# Patient Record
Sex: Female | Born: 1945
Health system: Southern US, Community
[De-identification: ages and names within clinical notes are randomized; demographics above are authoritative.]

## PROBLEM LIST (undated history)

## (undated) DIAGNOSIS — M5431 Sciatica, right side: Secondary | ICD-10-CM

## (undated) DIAGNOSIS — E785 Hyperlipidemia, unspecified: Secondary | ICD-10-CM

## (undated) DIAGNOSIS — I639 Cerebral infarction, unspecified: Secondary | ICD-10-CM

## (undated) DIAGNOSIS — IMO0002 Reserved for concepts with insufficient information to code with codable children: Secondary | ICD-10-CM

## (undated) DIAGNOSIS — I10 Essential (primary) hypertension: Secondary | ICD-10-CM

## (undated) DIAGNOSIS — Z720 Tobacco use: Secondary | ICD-10-CM

## (undated) DIAGNOSIS — M329 Systemic lupus erythematosus, unspecified: Secondary | ICD-10-CM

## (undated) HISTORY — PX: REVISION TOTAL HIP ARTHROPLASTY: SHX766

## (undated) HISTORY — PX: TUBAL LIGATION: SHX77

## (undated) HISTORY — DX: Cerebral infarction, unspecified: I63.9

## (undated) HISTORY — DX: Hyperlipidemia, unspecified: E78.5

---

## 2007-10-30 ENCOUNTER — Emergency Department (HOSPITAL_COMMUNITY): Admission: EM | Admit: 2007-10-30 | Discharge: 2007-10-30 | Payer: Self-pay | Admitting: Family Medicine

## 2012-10-17 ENCOUNTER — Emergency Department (INDEPENDENT_AMBULATORY_CARE_PROVIDER_SITE_OTHER)
Admission: EM | Admit: 2012-10-17 | Discharge: 2012-10-17 | Disposition: A | Discharge status: Home / Self Care | Payer: Medicare Other | Source: Home / Self Care

## 2012-10-17 ENCOUNTER — Encounter (HOSPITAL_COMMUNITY): Payer: Self-pay | Admitting: Emergency Medicine

## 2012-10-17 DIAGNOSIS — J019 Acute sinusitis, unspecified: Secondary | ICD-10-CM

## 2012-10-17 HISTORY — DX: Reserved for concepts with insufficient information to code with codable children: IMO0002

## 2012-10-17 HISTORY — DX: Systemic lupus erythematosus, unspecified: M32.9

## 2012-10-17 MED ORDER — PREDNISONE 20 MG PO TABS: 20.0000 mg | ORAL_TABLET | Freq: Every day | ORAL | Status: DC

## 2012-10-17 NOTE — ED Provider Notes (Signed)
History     CSN: [id number]  Arrival date & time 10/17/12  1204   First MD Initiated Contact with Patient 10/17/12 1232      Chief Complaint  Patient presents with  . Sinusitis    (Consider location/radiation/quality/duration/timing/severity/associated sxs/prior treatment) Patient is a 67 y.o. female presenting with sinusitis.  Sinusitis  Associated symptoms include congestion and sinus pressure.   67 year-old female presenting with pain in her forehead and "jaw joint" along with postnasal drip and some popping of her ears. She's not had any fevers or cough. She has some clear nasal discharge. She tried Afrin however it caused severe pain and does not want to try again. She did try some over-the-counter nasal decongestant pill which was helpful. Past Medical History  Diagnosis Date  . Lupus     History reviewed. No pertinent past surgical history.  No family history on file.  History  Substance Use Topics  . Smoking status: Current Every Day Smoker  . Smokeless tobacco: Not on file  . Alcohol Use: No    OB History   Grav Para Term Preterm Abortions TAB SAB Ect Mult Living                  Review of Systems  Constitutional: Negative.   HENT: Positive for congestion, rhinorrhea, sneezing, postnasal drip and sinus pressure. Negative for drooling and trouble swallowing.   Eyes: Negative.   Respiratory: Negative.   Cardiovascular: Negative.   Gastrointestinal: Negative.   Endocrine: Negative.   Genitourinary: Negative.   Musculoskeletal: Negative.   Skin: Negative.   Neurological: Negative.   Psychiatric/Behavioral: Negative.     Allergies  Review of patient's allergies indicates no known allergies.  Home Medications   Current Outpatient Rx  Name  Route  Sig  Dispense  Refill  . predniSONE (DELTASONE) 20 MG tablet   Oral   Take 1 tablet (20 mg total) by mouth daily.   7 tablet   0     Take 40 mg today and then 20 mg daily for 5 days     BP 148/79   Pulse 60  Temp(Src) 98.3 F (36.8 C) (Oral)  SpO2 100%  Physical Exam  Constitutional: She appears well-developed and well-nourished.  HENT:  Head: Normocephalic and atraumatic.  Tenderness of forehead, nasal congestion with oral pharyngeal exudate  Eyes: Pupils are equal, round, and reactive to light.  Neck: Normal range of motion.  Cardiovascular: Normal rate and regular rhythm.   Pulmonary/Chest: Effort normal and breath sounds normal.    ED Course  Procedures (including critical care time)  Labs Reviewed - No data to display No results found.   1. Acute sinusitis       MDM  Continue nasal decongestant, ocean nasal spray and afirn as tolerated. Add Neil-Med sinus rinse, tylenol or Ibuprofena nd Zyrtec.         Calvert Cantor, MD 10/17/12 1413

## 2012-10-17 NOTE — ED Notes (Signed)
Pt is here for poss sinus infection onset yest am Sx include: sneezing, nasal congestion w/clear mucous, facial pressure, headache,  Denies: f/v/n/d Taking OTC decongestants w/little relief  She is alert w/no signs of acute distress.

## 2015-06-03 ENCOUNTER — Inpatient Hospital Stay (HOSPITAL_COMMUNITY): Payer: Medicare HMO

## 2015-06-03 ENCOUNTER — Inpatient Hospital Stay (HOSPITAL_COMMUNITY)
Admission: EM | Admit: 2015-06-03 | Discharge: 2015-06-05 | DRG: 470 | Disposition: A | Discharge status: Skilled Nursing Facility | Payer: Medicare HMO | Attending: Internal Medicine | Admitting: Internal Medicine

## 2015-06-03 ENCOUNTER — Encounter (HOSPITAL_COMMUNITY)
Admission: EM | Disposition: A | Discharge status: Skilled Nursing Facility | Payer: Self-pay | Source: Home / Self Care | Attending: Internal Medicine

## 2015-06-03 ENCOUNTER — Encounter (HOSPITAL_COMMUNITY): Payer: Self-pay | Admitting: *Deleted

## 2015-06-03 ENCOUNTER — Inpatient Hospital Stay (HOSPITAL_COMMUNITY): Payer: Medicare HMO | Admitting: Anesthesiology

## 2015-06-03 ENCOUNTER — Emergency Department (HOSPITAL_COMMUNITY): Payer: Medicare HMO

## 2015-06-03 DIAGNOSIS — R200 Anesthesia of skin: Secondary | ICD-10-CM | POA: Diagnosis present

## 2015-06-03 DIAGNOSIS — F17210 Nicotine dependence, cigarettes, uncomplicated: Secondary | ICD-10-CM | POA: Diagnosis present

## 2015-06-03 DIAGNOSIS — W109XXA Fall (on) (from) unspecified stairs and steps, initial encounter: Secondary | ICD-10-CM | POA: Diagnosis present

## 2015-06-03 DIAGNOSIS — Z720 Tobacco use: Secondary | ICD-10-CM | POA: Diagnosis not present

## 2015-06-03 DIAGNOSIS — M5431 Sciatica, right side: Secondary | ICD-10-CM | POA: Diagnosis present

## 2015-06-03 DIAGNOSIS — D72828 Other elevated white blood cell count: Secondary | ICD-10-CM | POA: Diagnosis present

## 2015-06-03 DIAGNOSIS — M25551 Pain in right hip: Secondary | ICD-10-CM | POA: Diagnosis present

## 2015-06-03 DIAGNOSIS — M329 Systemic lupus erythematosus, unspecified: Secondary | ICD-10-CM

## 2015-06-03 DIAGNOSIS — D72829 Elevated white blood cell count, unspecified: Secondary | ICD-10-CM | POA: Diagnosis present

## 2015-06-03 DIAGNOSIS — Y92019 Unspecified place in single-family (private) house as the place of occurrence of the external cause: Secondary | ICD-10-CM

## 2015-06-03 DIAGNOSIS — F17200 Nicotine dependence, unspecified, uncomplicated: Secondary | ICD-10-CM | POA: Diagnosis present

## 2015-06-03 DIAGNOSIS — S72001D Fracture of unspecified part of neck of right femur, subsequent encounter for closed fracture with routine healing: Secondary | ICD-10-CM | POA: Diagnosis not present

## 2015-06-03 DIAGNOSIS — IMO0002 Reserved for concepts with insufficient information to code with codable children: Secondary | ICD-10-CM | POA: Diagnosis present

## 2015-06-03 DIAGNOSIS — S72001A Fracture of unspecified part of neck of right femur, initial encounter for closed fracture: Principal | ICD-10-CM | POA: Diagnosis present

## 2015-06-03 DIAGNOSIS — Z96649 Presence of unspecified artificial hip joint: Secondary | ICD-10-CM

## 2015-06-03 DIAGNOSIS — M1611 Unilateral primary osteoarthritis, right hip: Secondary | ICD-10-CM | POA: Diagnosis present

## 2015-06-03 DIAGNOSIS — W19XXXA Unspecified fall, initial encounter: Secondary | ICD-10-CM | POA: Diagnosis present

## 2015-06-03 DIAGNOSIS — D62 Acute posthemorrhagic anemia: Secondary | ICD-10-CM | POA: Diagnosis not present

## 2015-06-03 HISTORY — DX: Tobacco use: Z72.0

## 2015-06-03 HISTORY — PX: TOTAL HIP ARTHROPLASTY: SHX124

## 2015-06-03 HISTORY — DX: Sciatica, right side: M54.31

## 2015-06-03 LAB — COMPREHENSIVE METABOLIC PANEL
ALT: 11 U/L — ABNORMAL LOW (ref 14–54)
AST: 16 U/L (ref 15–41)
Albumin: 3.5 g/dL (ref 3.5–5.0)
Alkaline Phosphatase: 43 U/L (ref 38–126)
Anion gap: 7 (ref 5–15)
BUN: 20 mg/dL (ref 6–20)
CO2: 25 mmol/L (ref 22–32)
Calcium: 8.3 mg/dL — ABNORMAL LOW (ref 8.9–10.3)
Chloride: 110 mmol/L (ref 101–111)
Creatinine, Ser: 0.91 mg/dL (ref 0.44–1.00)
GFR calc Af Amer: >60 mL/min (ref >60)
GFR calc non Af Amer: >60 mL/min (ref >60)
Glucose, Bld: 110 mg/dL — ABNORMAL HIGH (ref 65–99)
Potassium: 3.6 mmol/L (ref 3.5–5.1)
Sodium: 142 mmol/L (ref 135–145)
Total Bilirubin: 0.6 mg/dL (ref 0.3–1.2)
Total Protein: 6 g/dL — ABNORMAL LOW (ref 6.5–8.1)

## 2015-06-03 LAB — CBC WITH DIFFERENTIAL/PLATELET
Basophils Absolute: 0 10*3/uL (ref 0.0–0.1)
Basophils Relative: 0 %
Eosinophils Absolute: 0.1 10*3/uL (ref 0.0–0.7)
Eosinophils Relative: 1 %
HCT: 36.8 % (ref 36.0–46.0)
HEMOGLOBIN: 12.1 g/dL (ref 12.0–15.0)
LYMPHS ABS: 0.9 10*3/uL (ref 0.7–4.0)
LYMPHS PCT: 7 %
MCH: 32.3 pg (ref 26.0–34.0)
MCHC: 32.9 g/dL (ref 30.0–36.0)
MCV: 98.1 fL (ref 78.0–100.0)
MONO ABS: 0.7 10*3/uL (ref 0.1–1.0)
Monocytes Relative: 5 %
NEUTROS ABS: 10.8 10*3/uL — AB (ref 1.7–7.7)
Neutrophils Relative %: 87 %
PLATELETS: 282 10*3/uL (ref 150–400)
RBC: 3.75 MIL/uL — ABNORMAL LOW (ref 3.87–5.11)
RDW: 15.3 % (ref 11.5–15.5)
WBC: 12.5 10*3/uL — ABNORMAL HIGH (ref 4.0–10.5)

## 2015-06-03 LAB — CBC
HCT: 31.9 % — ABNORMAL LOW (ref 36.0–46.0)
Hemoglobin: 10.4 g/dL — ABNORMAL LOW (ref 12.0–15.0)
MCH: 32.4 pg (ref 26.0–34.0)
MCHC: 32.6 g/dL (ref 30.0–36.0)
MCV: 99.4 fL (ref 78.0–100.0)
PLATELETS: 261 10*3/uL (ref 150–400)
RBC: 3.21 MIL/uL — ABNORMAL LOW (ref 3.87–5.11)
RDW: 15.5 % (ref 11.5–15.5)
WBC: 8.9 10*3/uL (ref 4.0–10.5)

## 2015-06-03 LAB — PROTIME-INR
INR: 1.06 (ref 0.00–1.49)
PROTHROMBIN TIME: 14 s (ref 11.6–15.2)

## 2015-06-03 LAB — BASIC METABOLIC PANEL
ANION GAP: 9 (ref 5–15)
BUN: 25 mg/dL — ABNORMAL HIGH (ref 6–20)
CHLORIDE: 107 mmol/L (ref 101–111)
CO2: 23 mmol/L (ref 22–32)
Calcium: 9.4 mg/dL (ref 8.9–10.3)
Creatinine, Ser: 1.1 mg/dL — ABNORMAL HIGH (ref 0.44–1.00)
GFR calc Af Amer: 59 mL/min — ABNORMAL LOW (ref >60)
GFR, EST NON AFRICAN AMERICAN: 51 mL/min — AB (ref >60)
GLUCOSE: 127 mg/dL — AB (ref 65–99)
POTASSIUM: 4.3 mmol/L (ref 3.5–5.1)
Sodium: 139 mmol/L (ref 135–145)

## 2015-06-03 LAB — SURGICAL PCR SCREEN
MRSA, PCR: NEGATIVE
Staphylococcus aureus: NEGATIVE

## 2015-06-03 LAB — ABO/RH: ABO/RH(D): A NEG

## 2015-06-03 LAB — TYPE AND SCREEN
ABO/RH(D): A NEG
Antibody Screen: NEGATIVE

## 2015-06-03 LAB — GLUCOSE, CAPILLARY: GLUCOSE-CAPILLARY: 86 mg/dL (ref 65–99)

## 2015-06-03 SURGERY — ARTHROPLASTY, HIP, TOTAL, ANTERIOR APPROACH
Anesthesia: Monitor Anesthesia Care | Site: Hip | Laterality: Right

## 2015-06-03 MED ORDER — SODIUM CHLORIDE 0.9 % IV SOLN
INTRAVENOUS | Status: DC
  Administered 2015-06-03: 23:00:00 via INTRAVENOUS

## 2015-06-03 MED ORDER — PROPOFOL 10 MG/ML IV BOLUS
INTRAVENOUS | Status: AC
  Filled 2015-06-03: qty 20

## 2015-06-03 MED ORDER — PROPOFOL 500 MG/50ML IV EMUL
INTRAVENOUS | Status: DC | PRN
  Administered 2015-06-03: 50 ug/kg/min via INTRAVENOUS

## 2015-06-03 MED ORDER — MIDAZOLAM HCL 5 MG/5ML IJ SOLN
INTRAMUSCULAR | Status: DC | PRN
  Administered 2015-06-03: 2 mg via INTRAVENOUS

## 2015-06-03 MED ORDER — PROMETHAZINE HCL 25 MG/ML IJ SOLN: 6.2500 mg | INTRAMUSCULAR | Status: DC | PRN

## 2015-06-03 MED ORDER — ONDANSETRON HCL 4 MG/2ML IJ SOLN
INTRAMUSCULAR | Status: DC | PRN
Start: 2015-06-03 — End: 2015-06-03
  Administered 2015-06-03: 4 mg via INTRAVENOUS

## 2015-06-03 MED ORDER — ACETAMINOPHEN 10 MG/ML IV SOLN
1000.0000 mg | Freq: Once | INTRAVENOUS | Status: DC
  Filled 2015-06-03: qty 100

## 2015-06-03 MED ORDER — FLEET ENEMA 7-19 GM/118ML RE ENEM: 1.0000 | ENEMA | Freq: Once | RECTAL | Status: DC | PRN

## 2015-06-03 MED ORDER — ONDANSETRON HCL 4 MG/2ML IJ SOLN
INTRAMUSCULAR | Status: AC
  Filled 2015-06-03: qty 2

## 2015-06-03 MED ORDER — ONDANSETRON HCL 4 MG PO TABS: 4.0000 mg | ORAL_TABLET | Freq: Four times a day (QID) | ORAL | Status: DC | PRN

## 2015-06-03 MED ORDER — FENTANYL CITRATE (PF) 100 MCG/2ML IJ SOLN
INTRAMUSCULAR | Status: DC | PRN
  Administered 2015-06-03: 50 ug via INTRAVENOUS

## 2015-06-03 MED ORDER — OXYCODONE-ACETAMINOPHEN 5-325 MG PO TABS: 1.0000 | ORAL_TABLET | ORAL | Status: DC | PRN

## 2015-06-03 MED ORDER — CEFAZOLIN SODIUM-DEXTROSE 2-3 GM-% IV SOLR
INTRAVENOUS | Status: AC
  Filled 2015-06-03: qty 50

## 2015-06-03 MED ORDER — BUPIVACAINE HCL (PF) 0.25 % IJ SOLN
INTRAMUSCULAR | Status: AC
  Filled 2015-06-03: qty 30

## 2015-06-03 MED ORDER — ONDANSETRON HCL 4 MG PO TABS
4.0000 mg | ORAL_TABLET | Freq: Four times a day (QID) | ORAL | Status: DC | PRN
Start: 2015-06-03 — End: 2015-06-05

## 2015-06-03 MED ORDER — CEFAZOLIN SODIUM-DEXTROSE 2-3 GM-% IV SOLR
INTRAVENOUS | Status: DC | PRN
  Administered 2015-06-03: 2 g via INTRAVENOUS

## 2015-06-03 MED ORDER — ALUM & MAG HYDROXIDE-SIMETH 200-200-20 MG/5ML PO SUSP: 30.0000 mL | Freq: Four times a day (QID) | ORAL | Status: DC | PRN

## 2015-06-03 MED ORDER — NICOTINE 21 MG/24HR TD PT24
21.0000 mg | MEDICATED_PATCH | Freq: Every day | TRANSDERMAL | Status: DC
  Administered 2015-06-03 – 2015-06-05 (×3): 21 mg via TRANSDERMAL
  Filled 2015-06-03 (×3): qty 1

## 2015-06-03 MED ORDER — HYDROMORPHONE HCL 1 MG/ML IJ SOLN
INTRAMUSCULAR | Status: AC
  Administered 2015-06-03: 0.5 mg via INTRAVENOUS
  Filled 2015-06-03: qty 1

## 2015-06-03 MED ORDER — DOCUSATE SODIUM 100 MG PO CAPS
100.0000 mg | ORAL_CAPSULE | Freq: Two times a day (BID) | ORAL | Status: DC
  Administered 2015-06-04 – 2015-06-05 (×3): 100 mg via ORAL

## 2015-06-03 MED ORDER — POLYETHYLENE GLYCOL 3350 17 G PO PACK: 17.0000 g | PACK | Freq: Every day | ORAL | Status: DC | PRN

## 2015-06-03 MED ORDER — LIDOCAINE HCL (CARDIAC) 20 MG/ML IV SOLN
INTRAVENOUS | Status: AC
  Filled 2015-06-03: qty 5

## 2015-06-03 MED ORDER — ADULT MULTIVITAMIN W/MINERALS CH
1.0000 | ORAL_TABLET | Freq: Every day | ORAL | Status: DC
  Administered 2015-06-03 – 2015-06-05 (×3): 1 via ORAL
  Filled 2015-06-03 (×3): qty 1

## 2015-06-03 MED ORDER — DEXAMETHASONE SODIUM PHOSPHATE 10 MG/ML IJ SOLN
INTRAMUSCULAR | Status: AC
  Filled 2015-06-03: qty 1

## 2015-06-03 MED ORDER — ENOXAPARIN SODIUM 40 MG/0.4ML ~~LOC~~ SOLN
40.0000 mg | SUBCUTANEOUS | Status: DC
  Administered 2015-06-04 – 2015-06-05 (×2): 40 mg via SUBCUTANEOUS
  Filled 2015-06-03 (×2): qty 0.4

## 2015-06-03 MED ORDER — ONDANSETRON HCL 4 MG/2ML IJ SOLN: 4.0000 mg | Freq: Four times a day (QID) | INTRAMUSCULAR | Status: DC | PRN

## 2015-06-03 MED ORDER — 0.9 % SODIUM CHLORIDE (POUR BTL) OPTIME
TOPICAL | Status: DC | PRN
  Administered 2015-06-03: 1000 mL

## 2015-06-03 MED ORDER — MIDAZOLAM HCL 2 MG/2ML IJ SOLN
INTRAMUSCULAR | Status: AC
  Filled 2015-06-03: qty 4

## 2015-06-03 MED ORDER — FENTANYL CITRATE (PF) 100 MCG/2ML IJ SOLN: 25.0000 ug | INTRAMUSCULAR | Status: DC | PRN

## 2015-06-03 MED ORDER — MENTHOL 3 MG MT LOZG: 1.0000 | LOZENGE | OROMUCOSAL | Status: DC | PRN

## 2015-06-03 MED ORDER — MORPHINE SULFATE (PF) 2 MG/ML IV SOLN
1.0000 mg | INTRAVENOUS | Status: DC | PRN
  Administered 2015-06-03: 1 mg via INTRAVENOUS
  Filled 2015-06-03: qty 1

## 2015-06-03 MED ORDER — LACTATED RINGERS IV SOLN
INTRAVENOUS | Status: DC
  Administered 2015-06-03: 20:00:00 via INTRAVENOUS
  Administered 2015-06-03: 1000 mL via INTRAVENOUS

## 2015-06-03 MED ORDER — PHENOL 1.4 % MT LIQD
1.0000 | OROMUCOSAL | Status: DC | PRN
  Filled 2015-06-03: qty 177

## 2015-06-03 MED ORDER — ACETAMINOPHEN 10 MG/ML IV SOLN
INTRAVENOUS | Status: AC
  Filled 2015-06-03: qty 100

## 2015-06-03 MED ORDER — MORPHINE SULFATE (PF) 2 MG/ML IV SOLN
2.0000 mg | INTRAVENOUS | Status: DC | PRN
  Administered 2015-06-03 (×3): 2 mg via INTRAVENOUS
  Filled 2015-06-03 (×3): qty 1

## 2015-06-03 MED ORDER — HYDROMORPHONE HCL 1 MG/ML IJ SOLN
0.5000 mg | INTRAMUSCULAR | Status: DC | PRN
  Administered 2015-06-03: 0.5 mg via INTRAVENOUS
  Filled 2015-06-03: qty 1

## 2015-06-03 MED ORDER — METOCLOPRAMIDE HCL 10 MG PO TABS: 5.0000 mg | ORAL_TABLET | Freq: Three times a day (TID) | ORAL | Status: DC | PRN

## 2015-06-03 MED ORDER — FENTANYL CITRATE (PF) 100 MCG/2ML IJ SOLN
INTRAMUSCULAR | Status: AC
  Filled 2015-06-03: qty 4

## 2015-06-03 MED ORDER — ACETAMINOPHEN 325 MG PO TABS: 650.0000 mg | ORAL_TABLET | Freq: Four times a day (QID) | ORAL | Status: DC | PRN

## 2015-06-03 MED ORDER — BISACODYL 10 MG RE SUPP
10.0000 mg | Freq: Every day | RECTAL | Status: DC | PRN
Start: 2015-06-03 — End: 2015-06-05

## 2015-06-03 MED ORDER — BUPIVACAINE HCL (PF) 0.5 % IJ SOLN
INTRAMUSCULAR | Status: DC | PRN
  Administered 2015-06-03: 3 mL

## 2015-06-03 MED ORDER — SODIUM CHLORIDE 0.9 % IV SOLN
INTRAVENOUS | Status: DC
  Administered 2015-06-03 (×2): via INTRAVENOUS

## 2015-06-03 MED ORDER — DEXAMETHASONE SODIUM PHOSPHATE 10 MG/ML IJ SOLN
10.0000 mg | Freq: Once | INTRAMUSCULAR | Status: AC
Start: 2015-06-03 — End: 2015-06-03
  Administered 2015-06-03: 10 mg via INTRAVENOUS

## 2015-06-03 MED ORDER — PROPOFOL 10 MG/ML IV BOLUS
INTRAVENOUS | Status: DC | PRN
  Administered 2015-06-03: 10 mg via INTRAVENOUS
  Administered 2015-06-03: 20 mg via INTRAVENOUS
  Administered 2015-06-03: 10 mg via INTRAVENOUS
  Administered 2015-06-03: 20 mg via INTRAVENOUS

## 2015-06-03 MED ORDER — TRAMADOL HCL 50 MG PO TABS
50.0000 mg | ORAL_TABLET | Freq: Four times a day (QID) | ORAL | Status: DC | PRN
Start: 2015-06-03 — End: 2015-06-05

## 2015-06-03 MED ORDER — CYCLOBENZAPRINE HCL 5 MG PO TABS
5.0000 mg | ORAL_TABLET | Freq: Three times a day (TID) | ORAL | Status: DC | PRN
  Administered 2015-06-04 – 2015-06-05 (×2): 5 mg via ORAL
  Filled 2015-06-03 (×2): qty 1

## 2015-06-03 MED ORDER — METOCLOPRAMIDE HCL 5 MG/ML IJ SOLN: 5.0000 mg | Freq: Three times a day (TID) | INTRAMUSCULAR | Status: DC | PRN

## 2015-06-03 MED ORDER — PHENYLEPHRINE HCL 10 MG/ML IJ SOLN
INTRAMUSCULAR | Status: DC | PRN
  Administered 2015-06-03 (×5): 80 ug via INTRAVENOUS

## 2015-06-03 MED ORDER — OXYCODONE HCL 5 MG PO TABS
5.0000 mg | ORAL_TABLET | ORAL | Status: DC | PRN
  Administered 2015-06-04 – 2015-06-05 (×5): 10 mg via ORAL
  Filled 2015-06-03 (×5): qty 2

## 2015-06-03 MED ORDER — CEFAZOLIN SODIUM-DEXTROSE 2-3 GM-% IV SOLR
2.0000 g | Freq: Four times a day (QID) | INTRAVENOUS | Status: AC
  Administered 2015-06-04 (×2): 2 g via INTRAVENOUS
  Filled 2015-06-03 (×2): qty 50

## 2015-06-03 MED ORDER — TRANEXAMIC ACID 1000 MG/10ML IV SOLN
1000.0000 mg | INTRAVENOUS | Status: AC
  Administered 2015-06-03: 1000 mg via INTRAVENOUS
  Filled 2015-06-03: qty 10

## 2015-06-03 MED ORDER — BUPIVACAINE HCL (PF) 0.25 % IJ SOLN
INTRAMUSCULAR | Status: DC | PRN
  Administered 2015-06-03: 30 mL

## 2015-06-03 SURGICAL SUPPLY — 31 items
BAG DECANTER FOR FLEXI CONT (MISCELLANEOUS) ×3 IMPLANT
BAG ZIPLOCK 12X15 (MISCELLANEOUS) ×3 IMPLANT
BLADE SAG 18X100X1.27 (BLADE) ×3 IMPLANT
CAPT HIP TOTAL 2 ×3 IMPLANT
CLOSURE WOUND 1/2 X4 (GAUZE/BANDAGES/DRESSINGS) ×1
COVER PERINEAL POST (MISCELLANEOUS) ×3 IMPLANT
DECANTER SPIKE VIAL GLASS SM (MISCELLANEOUS) ×3 IMPLANT
DRAPE STERI IOBAN 125X83 (DRAPES) ×3 IMPLANT
DRAPE U-SHAPE 47X51 STRL (DRAPES) ×6 IMPLANT
DRSG ADAPTIC 3X8 NADH LF (GAUZE/BANDAGES/DRESSINGS) ×3 IMPLANT
DRSG MEPILEX BORDER 4X4 (GAUZE/BANDAGES/DRESSINGS) ×3 IMPLANT
DRSG MEPILEX BORDER 4X8 (GAUZE/BANDAGES/DRESSINGS) ×3 IMPLANT
DURAPREP 26ML APPLICATOR (WOUND CARE) ×3 IMPLANT
ELECT REM PT RETURN 9FT ADLT (ELECTROSURGICAL) ×3
ELECTRODE REM PT RTRN 9FT ADLT (ELECTROSURGICAL) ×1 IMPLANT
EVACUATOR 1/8 PVC DRAIN (DRAIN) ×3 IMPLANT
GLOVE BIO SURGEON STRL SZ7.5 (GLOVE) ×3 IMPLANT
GLOVE BIO SURGEON STRL SZ8 (GLOVE) ×6 IMPLANT
GLOVE BIOGEL PI IND STRL 8 (GLOVE) ×2 IMPLANT
GLOVE BIOGEL PI INDICATOR 8 (GLOVE) ×4
GOWN STRL REUS W/TWL LRG LVL3 (GOWN DISPOSABLE) ×3 IMPLANT
GOWN STRL REUS W/TWL XL LVL3 (GOWN DISPOSABLE) ×3 IMPLANT
PACK ANTERIOR HIP CUSTOM (KITS) ×3 IMPLANT
STRIP CLOSURE SKIN 1/2X4 (GAUZE/BANDAGES/DRESSINGS) ×2 IMPLANT
SUT ETHIBOND NAB CT1 #1 30IN (SUTURE) ×3 IMPLANT
SUT MNCRL AB 4-0 PS2 18 (SUTURE) ×3 IMPLANT
SUT VIC AB 2-0 CT1 27 (SUTURE) ×4
SUT VIC AB 2-0 CT1 TAPERPNT 27 (SUTURE) ×2 IMPLANT
SUT VLOC 180 0 24IN GS25 (SUTURE) ×3 IMPLANT
TRAY FOLEY W/METER SILVER 14FR (SET/KITS/TRAYS/PACK) ×3 IMPLANT
TRAY FOLEY W/METER SILVER 16FR (SET/KITS/TRAYS/PACK) IMPLANT

## 2015-06-03 NOTE — Progress Notes (Signed)
PT Cancellation Note  Patient Details Name: Minnah Llamas MRN: [id number] DOB: 1945/12/05   Cancelled Treatment:     PT order received but eval deferred.  Pt NPO after midnight for surgery.  Please reorder post op.   Jedrek Dinovo 06/03/2015, 7:49 AM

## 2015-06-03 NOTE — ED Notes (Signed)
Bed: IH47 Expected date:  Expected time:  Means of arrival:  Comments: EMS 69 yo female/fell at home 2 days ago, has had hip pain

## 2015-06-03 NOTE — ED Notes (Signed)
Provided mouth swaps to wet mouth.

## 2015-06-03 NOTE — Anesthesia Procedure Notes (Signed)
Spinal Patient location during procedure: OR Start time: 06/03/2015 7:10 PM End time: 06/03/2015 7:15 PM Staffing Anesthesiologist: Lyndle Herrlich Resident/CRNA: Darlys Gales R Performed by: resident/CRNA  Preanesthetic Checklist Completed: patient identified, site marked, surgical consent, pre-op evaluation, timeout performed, IV checked, risks and benefits discussed and monitors and equipment checked Spinal Block Patient position: sitting Prep: Betadine Patient monitoring: heart rate, continuous pulse ox and blood pressure Location: L3-4 Injection technique: single-shot Needle Needle type: Spinocan  Needle gauge: 22 G Needle length: 9 cm Needle insertion depth: 7 cm Assessment Sensory level: T6 Additional Notes Expiration date of kit checked and confirmed. Patient tolerated procedure well, without complications.

## 2015-06-03 NOTE — Progress Notes (Signed)
Text paged Dr. Blaine Hamper when patient arrived to floor bed [id number]

## 2015-06-03 NOTE — ED Provider Notes (Signed)
CSN: [id number]     Arrival date & time 06/03/15  0127 History   First MD Initiated Contact with Patient 06/03/15 0134     Chief Complaint  Patient presents with  . Hip Pain     (Consider location/radiation/quality/duration/timing/severity/associated sxs/prior Treatment) Patient is a 69 y.o. female presenting with hip pain. The history is provided by the patient. No language interpreter was used.  Hip Pain This is a new problem. Pertinent negatives include no abdominal pain, chest pain, chills, fever, numbness or weakness. Associated symptoms comments: Patient with complaint of right hip pain since fall 2 days ago down a short flight of steps and landing on the right side. She states it felt like a pulled muscle and she has been ambulatory until today. She states while at work earlier she twisted and felt a pop in the hip with subsequent excruciating pain. She has been unable to ambulate since then. No other injury during the previous fall. .    Past Medical History  Diagnosis Date  . Lupus [facility])    Past Surgical History  Procedure Laterality Date  . Tubal ligation     No family history on file. Social History  Substance Use Topics  . Smoking status: Current Every Day Smoker  . Smokeless tobacco: None  . Alcohol Use: No   OB History    No data available     Review of Systems  Constitutional: Negative for fever and chills.  Respiratory: Negative.  Negative for shortness of breath.   Cardiovascular: Negative.  Negative for chest pain.  Gastrointestinal: Negative.  Negative for abdominal pain.  Musculoskeletal:       See HPI.  Skin: Negative.   Neurological: Negative.  Negative for weakness and numbness.      Allergies  Floxin  Home Medications   Prior to Admission medications   Medication Sig Start Date End Date Taking? Authorizing Provider  predniSONE (DELTASONE) 20 MG tablet Take 1 tablet (20 mg total) by mouth daily. 10/17/12   Debbe Odea, MD   BP 175/75 mmHg   Pulse 67  Temp(Src) 98 F (36.7 C) (Oral)  Resp 13  Ht '5\' 10"'$  (1.778 m)  Wt 135 lb (61.236 kg)  BMI 19.37 kg/m2  SpO2 100% Physical Exam  Constitutional: She is oriented to person, place, and time. She appears well-developed and well-nourished.  HENT:  Head: Normocephalic.  Neck: Normal range of motion. Neck supple.  Cardiovascular: Normal rate, regular rhythm and intact distal pulses.   Pulmonary/Chest: Effort normal and breath sounds normal.  Abdominal: Soft. Bowel sounds are normal. There is no tenderness. There is no rebound and no guarding.  Musculoskeletal: Normal range of motion.  Right LE shortened and externally rotated. No bony deformity. There is no midline spinal tenderness of the back. Moves all extremities with exception of right lower.   Neurological: She is alert and oriented to person, place, and time.  Skin: Skin is warm and dry. No rash noted.  Psychiatric: She has a normal mood and affect.    ED Course  Procedures (including critical care time) Labs Review Labs Reviewed - No data to display  Imaging Review Dg Chest 1 View  06/03/2015  CLINICAL DATA:  Preop right hip fracture. EXAM: CHEST 1 VIEW COMPARISON:  None. FINDINGS: The cardiomediastinal contours are normal. Mild hyperinflation with bronchitic markings. Pulmonary vasculature is normal. No consolidation, pleural effusion, or pneumothorax. No acute osseous abnormalities are seen. IMPRESSION: Mild hyperinflation with increased bronchitic markings centrally, likely chronic. Electronically  Signed   By: Jeb Levering M.D.   On: 06/03/2015 02:14   Dg Hip Unilat With Pelvis 2-3 Views Right  06/03/2015  CLINICAL DATA:  Acute onset of right hip pain, after falling down 2 steps. Felt pop, and unable to bear weight. Initial encounter. EXAM: DG HIP (WITH OR WITHOUT PELVIS) 2-3V RIGHT COMPARISON:  None. FINDINGS: There is a mildly displaced subcapital fracture through the right femoral neck. The right femoral head  remains seated at the acetabulum. The left hip joint is grossly unremarkable in appearance. The sacroiliac joints are within normal limits. The visualized bowel gas pattern is grossly unremarkable. IMPRESSION: Mildly displaced subcapital fracture through the right femoral neck. Electronically Signed   By: Garald Balding M.D.   On: 06/03/2015 02:13   I have personally reviewed and evaluated these images and lab results as part of my medical decision-making.   EKG Interpretation None      MDM   Final diagnoses:  None    1. Right femoral neck fracture  Patient is not taking any daily medications and has no complaint of injury except to right hip. Orthopedics paged. Plan to admit to medicine with orthopedic consultation. Pain addressed. VSS.    Charlann Lange, PA-C 06/03/15 [id number]  April Palumbo, MD 06/03/15 [phone]

## 2015-06-03 NOTE — Transfer of Care (Signed)
Immediate Anesthesia Transfer of Care Note  Patient: Andrea Kennedy  Procedure(s) Performed: Procedure(s): RIGHT TOTAL HIP ARTHROPLASTY ANTERIOR APPROACH (Right)  Patient Location: PACU  Anesthesia Type:Spinal  Level of Consciousness:  sedated, patient cooperative and responds to stimulation  Airway & Oxygen Therapy:Patient Spontanous Breathing and Patient connected to face mask oxgen  Post-op Assessment:  Report given to PACU RN and Post -op Vital signs reviewed and stable  Post vital signs:  Reviewed and stable, spinal at L1  Last Vitals:  Filed Vitals:   06/03/15 1654  BP: 158/68  Pulse: 70  Temp: 37.2 C  Resp: 18    Complications: No apparent anesthesia complications

## 2015-06-03 NOTE — ED Notes (Signed)
Pt arrives to the ER via EMS for complaints of rt hip pain; pt states that she fell down 2 steps on Monday and had pain and discomfort but was still able to walk and use leg; pt states that this afternoon she was getting in the car and turned and felt a pop to rt hip; pt states that she has been unable to bear weight or walk since this she felt the pop; pt c/o numbness radiating down the rt thigh; pt states that the area "feels swollen"; no obvious swelling; pt with shortening to the right leg; pt c/o increase pain when attempt to move leg; + pulses

## 2015-06-03 NOTE — Progress Notes (Signed)
I have seen and assessed patient and agree with Dr.Niu assessment and plan. Patient presented with a right hip fracture mechanical in nature. Patient has been assessed orthopedics and patient for probable surgery this afternoon.

## 2015-06-03 NOTE — Interval H&P Note (Signed)
History and Physical Interval Note:  06/03/2015 6:17 PM  Andrea Kennedy  has presented today for surgery, with the diagnosis of Right Hip Fracture  The various methods of treatment have been discussed with the patient and family. After consideration of risks, benefits and other options for treatment, the patient has consented to  Procedure(s): TOTAL HIP ARTHROPLASTY ANTERIOR APPROACH (Right) as a surgical intervention .  The patient's history has been reviewed, patient examined, no change in status, stable for surgery.  I have reviewed the patient's chart and labs.  Questions were answered to the patient's satisfaction.     Gearlean Alf

## 2015-06-03 NOTE — Anesthesia Preprocedure Evaluation (Addendum)
Anesthesia Evaluation  Patient identified by MRN, date of birth, ID band Patient awake    Reviewed: Allergy & Precautions, NPO status , Patient's Chart, lab work & pertinent test results  History of Anesthesia Complications Negative for: history of anesthetic complications  Airway Mallampati: II  TM Distance: >3 FB Neck ROM: Full    Dental  (+) Dental Advisory Given, Poor Dentition, Chipped   Pulmonary Current Smoker (0.5 PPD),    Pulmonary exam normal breath sounds clear to auscultation       Cardiovascular Exercise Tolerance: Good (-) hypertensionnegative cardio ROS Normal cardiovascular exam Rhythm:Regular Rate:Normal     Neuro/Psych  Neuromuscular disease (sciatica) negative psych ROS   GI/Hepatic negative GI ROS, Neg liver ROS,   Endo/Other  negative endocrine ROS  Renal/GU negative Renal ROS     Musculoskeletal negative musculoskeletal ROS (+)   Abdominal   Peds  Hematology  (+) Blood dyscrasia, anemia ,   Anesthesia Other Findings Day of surgery medications reviewed with the patient.  Reproductive/Obstetrics                            Anesthesia Physical Anesthesia Plan  ASA: II  Anesthesia Plan: MAC and Spinal   Post-op Pain Management:    Induction:   Airway Management Planned:   Additional Equipment:   Intra-op Plan:   Post-operative Plan:   Informed Consent: I have reviewed the patients History and Physical, chart, labs and discussed the procedure including the risks, benefits and alternatives for the proposed anesthesia with the patient or authorized representative who has indicated his/her understanding and acceptance.   Dental advisory given  Plan Discussed with: CRNA  Anesthesia Plan Comments: (Discussed risks and benefits of and differences between spinal and general. Discussed risks of spinal including headache, backache, failure, bleeding, infection, and  nerve damage. Patient consents to spinal. Questions answered. Coagulation studies and platelet count acceptable.)        Anesthesia Quick Evaluation

## 2015-06-03 NOTE — H&P (Addendum)
[facility] History and Physical  [name] JQB:[id number] DOB: 25-Dec-1945 DOA: 06/03/2015  Referring physician: ED physician PCP: [name].  Specialists:   Chief Complaint: Fall and right hip pain  HPI: Andrea Kennedy is a 69 y.o. female with PMH of lupus on remission, tobacco abuse, right-sided sciatica, who presents with a full and right hip pain.  Patient reports that she had fall 2 days ago down a short flight of steps and landing on the right side. Patient states that the accident happened after she argued with her husband at home and was very emotional and tripped her steps at that moment. She did not have dizziness, chest pain, shortness of breath, unilateral weakness. She delevopped soreness over right hip. She states it felt like a pulled muscle. She took ibuprofen and continued to work until today. She states while at work earlier she twisted and felt a pop in R hip with subsequent excruciating pain. She feels like his right hip is swollen. She has been unable to ambulate since then. She states that she has right sciatica with mild numbness in the right leg from knee down to feet chronically, which has been slightly worsening. She does not have weakness or decreased sensation in her right leg. Patient does not have fever, chills, cough, shortness of breath, abdominal pain, diarrhea, symptoms of UTI, unilateral weakness.  In ED, patient was found to have right mild displaced subcapital fracture though femoral neck. WBC 12.5, temperature normal, no tachycardia, electrolytes okay. CXR showed mild hyperinflation with increased bronchitic markings centrally, likely chronic. Patient is admitted to inpatient for further evaluation and treatment. Orthopedic surgeon was consulted by ED.  Where does patient live?   At home   Can patient participate in ADLs?  Yes       Review of Systems:   General: no fevers, chills, no changes in body weight, has  fatigue HEENT: no blurry vision, hearing changes or sore throat Pulm: no dyspnea, coughing, wheezing CV: no chest pain, palpitations Abd: no nausea, vomiting, abdominal pain, diarrhea, constipation GU: no dysuria, burning on urination, increased urinary frequency, hematuria  Ext: no leg edema Neuro: has R leg numbness and R hip pain. no vision change or hearing loss Skin: no rash MSK: No muscle spasm, no deformity, no limitation of range of movement in spin Heme: No easy bruising.  Travel history: No recent long distant travel.  Allergy:  Allergies  Allergen Reactions  . Floxin [Ofloxacin] Other (See Comments)    Scared/shaky/pain attacks/confusion    Past Medical History  Diagnosis Date  . Lupus ([place])   . Tobacco abuse   . Sciatica of right side     Past Surgical History  Procedure Laterality Date  . Tubal ligation      Social History:  reports that she has been smoking.  She does not have any smokeless tobacco history on file. She reports that she does not drink alcohol or use illicit drugs.  Family History:  Family History  Problem Relation Age of Onset  . Breast cancer Mother   . Diabetes Mother   . Heart attack Father   . Fibromyalgia Sister      Prior to Admission medications   Medication Sig Start Date End Date Taking? Authorizing Provider  predniSONE (DELTASONE) 20 MG tablet Take 1 tablet (20 mg total) by mouth daily. 10/17/12   Debbe Odea, MD    Physical Exam: Filed Vitals:   06/03/15 0131 06/03/15 0320 06/03/15 4097 06/03/15  0532  BP: 175/75 144/83 154/65 115/63  Pulse: 67 63 64 62  Temp: 98 F (36.7 C)  98.2 F (36.8 C) 98.1 F (36.7 C)  TempSrc: Oral  Oral Oral  Resp: '13 20 12 13  '$ Height: '5\' 10"'$  (1.778 m)     Weight: 61.236 kg (135 lb)     SpO2: 100% 96% 99% 98%   General: Not in acute distress HEENT:       Eyes: PERRL, EOMI, no scleral icterus.       ENT: No discharge from the ears and nose, no pharynx injection, no tonsillar enlargement.         Neck: No JVD, no bruit, no mass felt. Heme: No neck lymph node enlargement. Cardiac: S1/S2, RRR, No murmurs, No gallops or rubs. Pulm:  No rales, wheezing, rhonchi or rubs. Abd: Soft, nondistended, nontender, no rebound pain, no organomegaly, BS present. Ext: No pitting leg edema bilaterally. 2+DP/PT pulse bilaterally. Musculoskeletal: Right leg is shortened, externally rotated with tenderness. No swelling over right hip. Skin: No rashes.  Neuro: Alert, oriented X3, cranial nerves II-XII grossly intact, muscle strength 5/5 in all extremities, sensation to light touch intact. Brachial reflex 1+ bilaterally. Knee reflex 1+ bilaterally.  Psych: Patient is not psychotic, no suicidal or hemocidal ideation.  Labs on Admission:  Basic Metabolic Panel:  Recent Labs Lab 06/03/15 0248  NA 139  K 4.3  CL 107  CO2 23  GLUCOSE 127*  BUN 25*  CREATININE 1.10*  CALCIUM 9.4   Liver Function Tests: No results for input(s): AST, ALT, ALKPHOS, BILITOT, PROT, ALBUMIN in the last 168 hours. No results for input(s): LIPASE, AMYLASE in the last 168 hours. No results for input(s): AMMONIA in the last 168 hours. CBC:  Recent Labs Lab 06/03/15 0248 06/03/15 0531  WBC 12.5* 8.9  NEUTROABS 10.8*  --   HGB 12.1 10.4*  HCT 36.8 31.9*  MCV 98.1 99.4  PLT 282 261   Cardiac Enzymes: No results for input(s): CKTOTAL, CKMB, CKMBINDEX, TROPONINI in the last 168 hours.  BNP (last 3 results) No results for input(s): BNP in the last 8760 hours.  ProBNP (last 3 results) No results for input(s): PROBNP in the last 8760 hours.  CBG: No results for input(s): GLUCAP in the last 168 hours.  Radiological Exams on Admission: Dg Chest 1 View  06/03/2015  CLINICAL DATA:  Preop right hip fracture. EXAM: CHEST 1 VIEW COMPARISON:  None. FINDINGS: The cardiomediastinal contours are normal. Mild hyperinflation with bronchitic markings. Pulmonary vasculature is normal. No consolidation, pleural effusion,  or pneumothorax. No acute osseous abnormalities are seen. IMPRESSION: Mild hyperinflation with increased bronchitic markings centrally, likely chronic. Electronically Signed   By: Jeb Levering M.D.   On: 06/03/2015 02:14   Dg Hip Unilat With Pelvis 2-3 Views Right  06/03/2015  CLINICAL DATA:  Acute onset of right hip pain, after falling down 2 steps. Felt pop, and unable to bear weight. Initial encounter. EXAM: DG HIP (WITH OR WITHOUT PELVIS) 2-3V RIGHT COMPARISON:  None. FINDINGS: There is a mildly displaced subcapital fracture through the right femoral neck. The right femoral head remains seated at the acetabulum. The left hip joint is grossly unremarkable in appearance. The sacroiliac joints are within normal limits. The visualized bowel gas pattern is grossly unremarkable. IMPRESSION: Mildly displaced subcapital fracture through the right femoral neck. Electronically Signed   By: Garald Balding M.D.   On: 06/03/2015 02:13    EKG:  Not done in ED, will get  one.   Assessment/Plan Principal Problem:   Fracture of hip, right, closed ([place]) Active Problems:   Lupus ([place])   Tobacco abuse   Sciatica of right side   Fall   Leukocytosis  Fracture of hip, right, closed ([place]): As evidenced by x-ray. Patient has moderate pain now. She has slightly worsening numbness in right leg, but not weakness. Orthopedic surgeon was consulted.   - will admit to med-surg bed - Pain control: morphine prn and percocet - prn Flexeril for muscle spasm - follow up ortho recs - NPO after MN - type and cross, and INR  Leukocytosis: Likely due to stress-induced demargination. Patient does not have signs of infection. -Follow-up CBC  Lupus ([place]): on remission. Stopped taking prednisone 3 years ago. No acute issues.  Tobacco abuse: -Did counseling about importance of quitting smoking -Nicotine patch  Sciatica of right side: -prn Tylenol and  Percocet   DVT ppx: SCD Code Status: Full code Family  Communication: None at bed side.   Disposition Plan: Admit to inpatient   Date of Service 06/03/2015    Ivor Costa [facility] Pager [phone]  If 7PM-7AM, please contact night-coverage [email or web address] Password [email or web address] 06/03/2015, 5:54 AM

## 2015-06-03 NOTE — Progress Notes (Signed)
   Subjective: Hospital day - 0 Patient reports pain as mild and moderate.   Patient seen in rounds for Dr. Wynelle Link. Patient is recently admitted for right femoral neck fracture sustained during a fall. Dr. Wynelle Link was consulted being on [place] call last night when the patient came to the ED. Due to the displaced femoral neck, it is felt that she would benefit from undergoing surgical intervention.  Options were discussed, and due to her age and preexisting degenerative changes, it is felt that she would benefit from undergoing a total hip replacement for fixation of the fracture.  Objective: Vital signs in last 24 hours: Temp:  [98 F (36.7 C)-98.6 F (37 C)] 98.6 F (37 C) (10/19 1300) Pulse Rate:  [62-67] 62 (10/19 1300) Resp:  [12-20] 16 (10/19 1300) BP: (113-175)/(60-83) 126/63 mmHg (10/19 1300) SpO2:  [95 %-100 %] 96 % (10/19 1300) Weight:  [61.236 kg (135 lb)] 61.236 kg (135 lb) (10/19 0131)  Intake/Output from previous day:  Intake/Output Summary (Last 24 hours) at 06/03/15 1610 Last data filed at 06/03/15 1400  Gross per 24 hour  Intake 254.17 ml  Output    650 ml  Net -395.83 ml    Intake/Output this shift: Total I/O In: -  Out: 450 [Urine:450]  Labs:  Recent Labs  06/03/15 0248 06/03/15 0531  HGB 12.1 10.4*    Recent Labs  06/03/15 0248 06/03/15 0531  WBC 12.5* 8.9  RBC 3.75* 3.21*  HCT 36.8 31.9*  PLT 282 261    Recent Labs  06/03/15 0248 06/03/15 0531  NA 139 142  K 4.3 3.6  CL 107 110  CO2 23 25  BUN 25* 20  CREATININE 1.10* 0.91  GLUCOSE 127* 110*  CALCIUM 9.4 8.3*    Recent Labs  06/03/15 0248  INR 1.06    EXAM General - Patient is Alert, Appropriate and Oriented Extremity - Neurovascular intact Sensation intact distally Intact pulses distally Dorsiflexion/Plantar flexion intact Motor Function - intact, moving foot and toes well on exam. Pain with hip roll on exam.  Past Medical History  Diagnosis Date  . Lupus  ([place])   . Tobacco abuse   . Sciatica of right side     Assessment/Plan: Hospital day - 0 Principal Problem:   Fracture of hip, right, closed ([place]) Active Problems:   Lupus ([place])   Tobacco abuse   Sciatica of right side   Fall   Leukocytosis  Estimated body mass index is 19.37 kg/(m^2) as calculated from the following:   Height as of this encounter: '5\' 10"'$  (1.778 m).   Weight as of this encounter: 61.236 kg (135 lb). NPO Consent for surgery Right Total Hip Arthroplasty - Anterior Approach Surgery to be performed by Dr. Wynelle Link.  Arlee Muslim, PA-C Orthopaedic Surgery 06/03/2015, 4:10 PM

## 2015-06-03 NOTE — Op Note (Signed)
OPERATIVE REPORT  PREOPERATIVE DIAGNOSIS: Displaced right femoral neck fracture  POSTOPERATIVE DIAGNOSIS: Displaced right femoral neck fracture  PROCEDURE: Right total hip arthroplasty, anterior approach.   SURGEON: Gaynelle Arabian, MD   ASSISTANT: Arlee Muslim, PA-C  ANESTHESIA:  Spinal  ESTIMATED BLOOD LOSS:-250 ml    DRAINS: Hemovac x1.   COMPLICATIONS: None   CONDITION: PACU - hemodynamically stable.   BRIEF CLINICAL NOTE: Andrea Kennedy is a 69 y.o. female who had a fall yesterday and sustained a displaced right femoral neck Fracture. She had some pre-existing hip pain and arthritis. She has been cleared medically and presents for right total hip arthroplasty.   PROCEDURE IN DETAIL: After successful administration of spinal  anesthetic, the traction boots for the [facility] bed were placed on both  feet and the patient was placed onto the [facility] bed, boots placed into the leg  holders. The Right hip was then isolated from the perineum with plastic  drapes and prepped and draped in the usual sterile fashion. ASIS and  greater trochanter were marked and a oblique incision was made, starting  at about 1 cm lateral and 2 cm distal to the ASIS and coursing towards  the anterior cortex of the femur. The skin was cut with a 10 blade  through subcutaneous tissue to the level of the fascia overlying the  tensor fascia lata muscle. The fascia was then incised in line with the  incision at the junction of the anterior third and posterior 2/3rd. The  muscle was teased off the fascia and then the interval between the TFL  and the rectus was developed. The Hohmann retractor was then placed at  the top of the femoral neck over the capsule. The vessels overlying the  capsule were cauterized and the fat on top of the capsule was removed.  A Hohmann retractor was then placed anterior underneath the rectus  femoris to give exposure to the entire anterior capsule. A T-shaped  capsulotomy  was performed. The edges were tagged and the femoral head  was identified.       Osteophytes are removed off the superior acetabulum.  The femoral neck was then cut in situ with an oscillating saw. Traction  was then applied to the left lower extremity utilizing the [facility]  traction. The femoral head was then removed. Retractors were placed  around the acetabulum and then circumferential removal of the labrum was  performed. Osteophytes were also removed. Reaming starts at 43 mm to  medialize and  Increased in 2 mm increments to 47 mm. We reamed in  approximately 40 degrees of abduction, 20 degrees anteversion. A 48 mm  pinnacle acetabular shell was then impacted in anatomic position under  fluoroscopic guidance with excellent purchase. We did not need to place  any additional dome screws. A 28 mm neutral + 4 marathon liner was then  placed into the acetabular shell.       The femoral lift was then placed along the lateral aspect of the femur  just distal to the vastus ridge. The leg was  externally rotated and capsule  was stripped off the inferior aspect of the femoral neck down to the  level of the lesser trochanter, this was done with electrocautery. The femur was lifted after this was performed. The  leg was then placed and extended in adducted position to essentially delivering the femur. We also removed the capsule superiorly and the  piriformis from the piriformis fossa  to gain excellent exposure of the  proximal femur. Rongeur was used to remove some cancellous bone to get  into the lateral portion of the proximal femur for placement of the  initial starter reamer. The starter broaches was placed  the starter broach  and was shown to go down the center of the canal. Broaching  with the  Cor[place] system was then performed starting at size 8, coursing  Up to size 11. A size 11 had excellent torsional and rotational  and axial stability. The trial standard offset neck was then placed   with a 28 + 1.5 trial head. The hip was then reduced. We confirmed that  the stem was in the canal both on AP and lateral x-rays. It also has excellent sizing. The hip was reduced with outstanding stability through full extension, full external rotation,  and then flexion in adduction internal rotation. AP pelvis was taken  and the leg lengths were measured and found to be exactly equal. Hip  was then dislocated again and the femoral head and neck removed. The  femoral broach was removed. Size 11 Corail stem with a standard offset  neck was then impacted into the femur following native anteversion. Has  excellent purchase in the canal. Excellent torsional and rotational and  axial stability. It is confirmed to be in the canal on AP and lateral  fluoroscopic views. The 28 + 1.5 ceramic head was placed and the hip  reduced with outstanding stability. Again AP pelvis was taken and it  confirmed that the leg lengths were equal. The wound was then copiously  irrigated with saline solution and the capsule reattached and repaired  with Ethibond suture. 30 ml of .25% Bupivicaine injected into the capsule and into the edge of the tensor fascia lata as well as subcutaneous tissue. The fascia overlying the tensor fascia lata was  then closed with a running #1 V-Loc. Subcu was closed with interrupted  2-0 Vicryl and subcuticular running 4-0 Monocryl. Incision was cleaned  and dried. Steri-Strips and a bulky sterile dressing applied. Hemovac  drain was hooked to suction and then he was awakened and transported to  recovery in stable condition.        Please note that a surgical assistant was a medical necessity for this procedure to perform it in a safe and expeditious manner. Assistant was necessary to provide appropriate retraction of vital neurovascular structures and to prevent femoral fracture and allow for anatomic placement of the prosthesis.  Gaynelle Arabian, M.D.

## 2015-06-03 NOTE — ED Provider Notes (Signed)
Medical screening examination/treatment/procedure(s) were conducted as a shared visit with non-physician practitioner(s) and myself.  I personally evaluated the patient during the encounter.   EKG Interpretation None       2:30 AM  HPI Comments: Andrea Kennedy is a 69 y.o. female who presents to the Emergency Department complaining of a fall that occurred 2 days ago when she fell down a short flight of stairs and landed on her right side. Pt reports right hip pain that she describes as a pulled muscle onset 2 days ago post fall but worsening of pain 10 hours ago with movement of her RLE when she heard a pop and felt immediate worsening of pain. Current pain unrelieved by ibuprofen.   PE:  RRR Lungs clear Normal bowel sounds; abdomen soft no rebound no guarding PEERL Moist mucous membranes Foreshortening and rotation of right lower extremity DTRs normal  Images:   Dg Chest 1 View  06/03/2015  CLINICAL DATA:  Preop right hip fracture. EXAM: CHEST 1 VIEW COMPARISON:  None. FINDINGS: The cardiomediastinal contours are normal. Mild hyperinflation with bronchitic markings. Pulmonary vasculature is normal. No consolidation, pleural effusion, or pneumothorax. No acute osseous abnormalities are seen. IMPRESSION: Mild hyperinflation with increased bronchitic markings centrally, likely chronic. Electronically Signed   By: Jeb Levering M.D.   On: 06/03/2015 02:14   Dg Hip Unilat With Pelvis 2-3 Views Right  06/03/2015  CLINICAL DATA:  Acute onset of right hip pain, after falling down 2 steps. Felt pop, and unable to bear weight. Initial encounter. EXAM: DG HIP (WITH OR WITHOUT PELVIS) 2-3V RIGHT COMPARISON:  None. FINDINGS: There is a mildly displaced subcapital fracture through the right femoral neck. The right femoral head remains seated at the acetabulum. The left hip joint is grossly unremarkable in appearance. The sacroiliac joints are within normal limits. The visualized bowel gas pattern  is grossly unremarkable. IMPRESSION: Mildly displaced subcapital fracture through the right femoral neck. Electronically Signed   By: Garald Balding M.D.   On: 06/03/2015 02:13   Plan admit    Khia Dieterich, MD 06/03/15 [phone]

## 2015-06-03 NOTE — H&P (View-Only) (Signed)
Reason for Consult: Right femoral neck fracture Referring Physician: Dr. Soundra Pilon is an 69 y.o. female.  HPI: Andrea Kennedy is a 69 yo female with right hip pain which began yesterday. She had a fall down steps early in day and felt as though she had just pulled a muscle with thigh pain. She was getting something out of her car later that day and twisted with an immediate large pop and immediate severe pain. She was unable to bear weight and came to ED where radiographs showed a displaced right femoral neck fracture.  She does not have any other complaints. She was admitted by the medical team and cleared for surgery  Past Medical History  Diagnosis Date  . Lupus ([place])   . Tobacco abuse   . Sciatica of right side     Past Surgical History  Procedure Laterality Date  . Tubal ligation      Family History  Problem Relation Age of Onset  . Breast cancer Mother   . Diabetes Mother   . Heart attack Father   . Fibromyalgia Sister     Social History:  reports that she has been smoking.  She does not have any smokeless tobacco history on file. She reports that she does not drink alcohol or use illicit drugs.  Allergies:  Allergies  Allergen Reactions  . Floxin [Ofloxacin] Other (See Comments)    Scared/shaky/pain attacks/confusion    Medications: I have reviewed the patient's current medications.  Results for orders placed or performed during the hospital encounter of 06/03/15 (from the past 48 hour(s))  Basic metabolic panel     Status: Abnormal   Collection Time: 06/03/15  2:48 AM  Result Value Ref Range   Sodium 139 135 - 145 mmol/L   Potassium 4.3 3.5 - 5.1 mmol/L   Chloride 107 101 - 111 mmol/L   CO2 23 22 - 32 mmol/L   Glucose, Bld 127 (H) 65 - 99 mg/dL   BUN 25 (H) 6 - 20 mg/dL   Creatinine, Ser 1.10 (H) 0.44 - 1.00 mg/dL   Calcium 9.4 8.9 - 10.3 mg/dL   GFR calc non Af Amer 51 (L) >60 mL/min   GFR calc Af Amer 59 (L) >60 mL/min    Comment: (NOTE) The  eGFR has been calculated using the CKD EPI equation. This calculation has not been validated in all clinical situations. eGFR's persistently <60 mL/min signify possible Chronic Kidney Disease.    Anion gap 9 5 - 15  CBC WITH DIFFERENTIAL     Status: Abnormal   Collection Time: 06/03/15  2:48 AM  Result Value Ref Range   WBC 12.5 (H) 4.0 - 10.5 K/uL   RBC 3.75 (L) 3.87 - 5.11 MIL/uL   Hemoglobin 12.1 12.0 - 15.0 g/dL   HCT 36.8 36.0 - 46.0 %   MCV 98.1 78.0 - 100.0 fL   MCH 32.3 26.0 - 34.0 pg   MCHC 32.9 30.0 - 36.0 g/dL   RDW 15.3 11.5 - 15.5 %   Platelets 282 150 - 400 K/uL   Neutrophils Relative % 87 %   Neutro Abs 10.8 (H) 1.7 - 7.7 K/uL   Lymphocytes Relative 7 %   Lymphs Abs 0.9 0.7 - 4.0 K/uL   Monocytes Relative 5 %   Monocytes Absolute 0.7 0.1 - 1.0 K/uL   Eosinophils Relative 1 %   Eosinophils Absolute 0.1 0.0 - 0.7 K/uL   Basophils Relative 0 %   Basophils Absolute 0.0  0.0 - 0.1 K/uL  Protime-INR     Status: None   Collection Time: 06/03/15  2:48 AM  Result Value Ref Range   Prothrombin Time 14.0 11.6 - 15.2 seconds   INR 1.06 0.00 - 1.49  Type and screen Mobile     Status: None   Collection Time: 06/03/15  2:48 AM  Result Value Ref Range   ABO/RH(D) A NEG    Antibody Screen NEG    Sample Expiration 06/06/2015   ABO/Rh     Status: None   Collection Time: 06/03/15  3:00 AM  Result Value Ref Range   ABO/RH(D) A NEG   Comprehensive metabolic panel     Status: Abnormal   Collection Time: 06/03/15  5:31 AM  Result Value Ref Range   Sodium 142 135 - 145 mmol/L   Potassium 3.6 3.5 - 5.1 mmol/L   Chloride 110 101 - 111 mmol/L   CO2 25 22 - 32 mmol/L   Glucose, Bld 110 (H) 65 - 99 mg/dL   BUN 20 6 - 20 mg/dL   Creatinine, Ser 0.91 0.44 - 1.00 mg/dL   Calcium 8.3 (L) 8.9 - 10.3 mg/dL   Total Protein 6.0 (L) 6.5 - 8.1 g/dL   Albumin 3.5 3.5 - 5.0 g/dL   AST 16 15 - 41 U/L   ALT 11 (L) 14 - 54 U/L   Alkaline Phosphatase 43 38 - 126 U/L    Total Bilirubin 0.6 0.3 - 1.2 mg/dL   GFR calc non Af Amer >60 >60 mL/min   GFR calc Af Amer >60 >60 mL/min    Comment: (NOTE) The eGFR has been calculated using the CKD EPI equation. This calculation has not been validated in all clinical situations. eGFR's persistently <60 mL/min signify possible Chronic Kidney Disease.    Anion gap 7 5 - 15  CBC     Status: Abnormal   Collection Time: 06/03/15  5:31 AM  Result Value Ref Range   WBC 8.9 4.0 - 10.5 K/uL   RBC 3.21 (L) 3.87 - 5.11 MIL/uL   Hemoglobin 10.4 (L) 12.0 - 15.0 g/dL   HCT 31.9 (L) 36.0 - 46.0 %   MCV 99.4 78.0 - 100.0 fL   MCH 32.4 26.0 - 34.0 pg   MCHC 32.6 30.0 - 36.0 g/dL   RDW 15.5 11.5 - 15.5 %   Platelets 261 150 - 400 K/uL  Glucose, capillary     Status: None   Collection Time: 06/03/15  7:51 AM  Result Value Ref Range   Glucose-Capillary 86 65 - 99 mg/dL  Surgical pcr screen     Status: None   Collection Time: 06/03/15  1:00 PM  Result Value Ref Range   MRSA, PCR NEGATIVE NEGATIVE   Staphylococcus aureus NEGATIVE NEGATIVE    Comment:        The Xpert SA Assay (FDA approved for NASAL specimens in patients over 64 years of age), is one component of a comprehensive surveillance program.  Test performance has been validated by [facility] for patients greater than or equal to 21 year old. It is not intended to diagnose infection nor to guide or monitor treatment.     Dg Chest 1 View  06/03/2015  CLINICAL DATA:  Preop right hip fracture. EXAM: CHEST 1 VIEW COMPARISON:  None. FINDINGS: The cardiomediastinal contours are normal. Mild hyperinflation with bronchitic markings. Pulmonary vasculature is normal. No consolidation, pleural effusion, or pneumothorax. No acute osseous abnormalities are seen.  IMPRESSION: Mild hyperinflation with increased bronchitic markings centrally, likely chronic. Electronically Signed   By: Jeb Levering M.D.   On: 06/03/2015 02:14   Dg Hip Unilat With Pelvis 2-3 Views  Right  06/03/2015  CLINICAL DATA:  Acute onset of right hip pain, after falling down 2 steps. Felt pop, and unable to bear weight. Initial encounter. EXAM: DG HIP (WITH OR WITHOUT PELVIS) 2-3V RIGHT COMPARISON:  None. FINDINGS: There is a mildly displaced subcapital fracture through the right femoral neck. The right femoral head remains seated at the acetabulum. The left hip joint is grossly unremarkable in appearance. The sacroiliac joints are within normal limits. The visualized bowel gas pattern is grossly unremarkable. IMPRESSION: Mildly displaced subcapital fracture through the right femoral neck. Electronically Signed   By: Garald Balding M.D.   On: 06/03/2015 02:13    ROS Blood pressure 126/63, pulse 62, temperature 98.6 F (37 C), temperature source Oral, resp. rate 16, height '5\' 10"'  (1.778 m), weight 61.236 kg (135 lb), SpO2 96 %. Physical Exam  Physical Examination: General appearance - alert, well appearing, and in no distress Mental status - alert, oriented to person, place, and time Neurological - alert, oriented, normal speech, no focal findings or movement disorder noted Right lower extremity shortened and externally rotated; pulses and sensation intact; EHL/FHL/TA/Gastroc intact; tender over lateral hip and pain with any attempted hip motion   Assessment/Plan: Right femoral neck fracture- She has a displaced femoral neck fracture in the setting of an inflammatory autoimmune disease (lupus) and some pre-existing hip arthritis. We discussed treatment options in detail including hemiarthroplasty vs. Total hip arthroplasty. Given her young age and the other factors mentioned above, I have recommended Total Hip Arthroplasty. We have discussed procedure, risks and potential complications and she elects to proceed  [place] V 06/03/2015, 4:24 PM

## 2015-06-03 NOTE — Consult Note (Signed)
Reason for Consult: Right femoral neck fracture Referring Physician: Dr. Soundra Kennedy is an 69 y.o. female.  HPI: Andrea Kennedy is a 69 yo female with right hip pain which began yesterday. She had a fall down steps early in day and felt as though she had just pulled a muscle with thigh pain. She was getting something out of her car later that day and twisted with an immediate large pop and immediate severe pain. She was unable to bear weight and came to ED where radiographs showed a displaced right femoral neck fracture.  She does not have any other complaints. She was admitted by the medical team and cleared for surgery  Past Medical History  Diagnosis Date  . Lupus ([place])   . Tobacco abuse   . Sciatica of right side     Past Surgical History  Procedure Laterality Date  . Tubal ligation      Family History  Problem Relation Age of Onset  . Breast cancer Mother   . Diabetes Mother   . Heart attack Father   . Fibromyalgia Sister     Social History:  reports that she has been smoking.  She does not have any smokeless tobacco history on file. She reports that she does not drink alcohol or use illicit drugs.  Allergies:  Allergies  Allergen Reactions  . Floxin [Ofloxacin] Other (See Comments)    Scared/shaky/pain attacks/confusion    Medications: I have reviewed the patient's current medications.  Results for orders placed or performed during the hospital encounter of 06/03/15 (from the past 48 hour(s))  Basic metabolic panel     Status: Abnormal   Collection Time: 06/03/15  2:48 AM  Result Value Ref Range   Sodium 139 135 - 145 mmol/L   Potassium 4.3 3.5 - 5.1 mmol/L   Chloride 107 101 - 111 mmol/L   CO2 23 22 - 32 mmol/L   Glucose, Bld 127 (H) 65 - 99 mg/dL   BUN 25 (H) 6 - 20 mg/dL   Creatinine, Ser 1.10 (H) 0.44 - 1.00 mg/dL   Calcium 9.4 8.9 - 10.3 mg/dL   GFR calc non Af Amer 51 (L) >60 mL/min   GFR calc Af Amer 59 (L) >60 mL/min    Comment: (NOTE) The  eGFR has been calculated using the CKD EPI equation. This calculation has not been validated in all clinical situations. eGFR's persistently <60 mL/min signify possible Chronic Kidney Disease.    Anion gap 9 5 - 15  CBC WITH DIFFERENTIAL     Status: Abnormal   Collection Time: 06/03/15  2:48 AM  Result Value Ref Range   WBC 12.5 (H) 4.0 - 10.5 K/uL   RBC 3.75 (L) 3.87 - 5.11 MIL/uL   Hemoglobin 12.1 12.0 - 15.0 g/dL   HCT 36.8 36.0 - 46.0 %   MCV 98.1 78.0 - 100.0 fL   MCH 32.3 26.0 - 34.0 pg   MCHC 32.9 30.0 - 36.0 g/dL   RDW 15.3 11.5 - 15.5 %   Platelets 282 150 - 400 K/uL   Neutrophils Relative % 87 %   Neutro Abs 10.8 (H) 1.7 - 7.7 K/uL   Lymphocytes Relative 7 %   Lymphs Abs 0.9 0.7 - 4.0 K/uL   Monocytes Relative 5 %   Monocytes Absolute 0.7 0.1 - 1.0 K/uL   Eosinophils Relative 1 %   Eosinophils Absolute 0.1 0.0 - 0.7 K/uL   Basophils Relative 0 %   Basophils Absolute 0.0  0.0 - 0.1 K/uL  Protime-INR     Status: None   Collection Time: 06/03/15  2:48 AM  Result Value Ref Range   Prothrombin Time 14.0 11.6 - 15.2 seconds   INR 1.06 0.00 - 1.49  Type and screen [place]     Status: None   Collection Time: 06/03/15  2:48 AM  Result Value Ref Range   ABO/RH(D) A NEG    Antibody Screen NEG    Sample Expiration 06/06/2015   ABO/Rh     Status: None   Collection Time: 06/03/15  3:00 AM  Result Value Ref Range   ABO/RH(D) A NEG   Comprehensive metabolic panel     Status: Abnormal   Collection Time: 06/03/15  5:31 AM  Result Value Ref Range   Sodium 142 135 - 145 mmol/L   Potassium 3.6 3.5 - 5.1 mmol/L   Chloride 110 101 - 111 mmol/L   CO2 25 22 - 32 mmol/L   Glucose, Bld 110 (H) 65 - 99 mg/dL   BUN 20 6 - 20 mg/dL   Creatinine, Ser 0.91 0.44 - 1.00 mg/dL   Calcium 8.3 (L) 8.9 - 10.3 mg/dL   Total Protein 6.0 (L) 6.5 - 8.1 g/dL   Albumin 3.5 3.5 - 5.0 g/dL   AST 16 15 - 41 U/L   ALT 11 (L) 14 - 54 U/L   Alkaline Phosphatase 43 38 - 126 U/L    Total Bilirubin 0.6 0.3 - 1.2 mg/dL   GFR calc non Af Amer >60 >60 mL/min   GFR calc Af Amer >60 >60 mL/min    Comment: (NOTE) The eGFR has been calculated using the CKD EPI equation. This calculation has not been validated in all clinical situations. eGFR's persistently <60 mL/min signify possible Chronic Kidney Disease.    Anion gap 7 5 - 15  CBC     Status: Abnormal   Collection Time: 06/03/15  5:31 AM  Result Value Ref Range   WBC 8.9 4.0 - 10.5 K/uL   RBC 3.21 (L) 3.87 - 5.11 MIL/uL   Hemoglobin 10.4 (L) 12.0 - 15.0 g/dL   HCT 31.9 (L) 36.0 - 46.0 %   MCV 99.4 78.0 - 100.0 fL   MCH 32.4 26.0 - 34.0 pg   MCHC 32.6 30.0 - 36.0 g/dL   RDW 15.5 11.5 - 15.5 %   Platelets 261 150 - 400 K/uL  Glucose, capillary     Status: None   Collection Time: 06/03/15  7:51 AM  Result Value Ref Range   Glucose-Capillary 86 65 - 99 mg/dL  Surgical pcr screen     Status: None   Collection Time: 06/03/15  1:00 PM  Result Value Ref Range   MRSA, PCR NEGATIVE NEGATIVE   Staphylococcus aureus NEGATIVE NEGATIVE    Comment:        The Xpert SA Assay (FDA approved for NASAL specimens in patients over 75 years of age), is one component of a comprehensive surveillance program.  Test performance has been validated by [facility] for patients greater than or equal to 64 year old. It is not intended to diagnose infection nor to guide or monitor treatment.     Dg Chest 1 View  06/03/2015  CLINICAL DATA:  Preop right hip fracture. EXAM: CHEST 1 VIEW COMPARISON:  None. FINDINGS: The cardiomediastinal contours are normal. Mild hyperinflation with bronchitic markings. Pulmonary vasculature is normal. No consolidation, pleural effusion, or pneumothorax. No acute osseous abnormalities are seen.  IMPRESSION: Mild hyperinflation with increased bronchitic markings centrally, likely chronic. Electronically Signed   By: Jeb Levering M.D.   On: 06/03/2015 02:14   Dg Hip Unilat With Pelvis 2-3 Views  Right  06/03/2015  CLINICAL DATA:  Acute onset of right hip pain, after falling down 2 steps. Felt pop, and unable to bear weight. Initial encounter. EXAM: DG HIP (WITH OR WITHOUT PELVIS) 2-3V RIGHT COMPARISON:  None. FINDINGS: There is a mildly displaced subcapital fracture through the right femoral neck. The right femoral head remains seated at the acetabulum. The left hip joint is grossly unremarkable in appearance. The sacroiliac joints are within normal limits. The visualized bowel gas pattern is grossly unremarkable. IMPRESSION: Mildly displaced subcapital fracture through the right femoral neck. Electronically Signed   By: Garald Balding M.D.   On: 06/03/2015 02:13    ROS Blood pressure 126/63, pulse 62, temperature 98.6 F (37 C), temperature source Oral, resp. rate 16, height '5\' 10"'  (1.778 m), weight 61.236 kg (135 lb), SpO2 96 %. Physical Exam  Physical Examination: General appearance - alert, well appearing, and in no distress Mental status - alert, oriented to person, place, and time Neurological - alert, oriented, normal speech, no focal findings or movement disorder noted Right lower extremity shortened and externally rotated; pulses and sensation intact; EHL/FHL/TA/Gastroc intact; tender over lateral hip and pain with any attempted hip motion   Assessment/Plan: Right femoral neck fracture- She has a displaced femoral neck fracture in the setting of an inflammatory autoimmune disease (lupus) and some pre-existing hip arthritis. We discussed treatment options in detail including hemiarthroplasty vs. Total hip arthroplasty. Given her young age and the other factors mentioned above, I have recommended Total Hip Arthroplasty. We have discussed procedure, risks and potential complications and she elects to proceed  [place] V 06/03/2015, 4:24 PM

## 2015-06-03 NOTE — Progress Notes (Signed)
OT Cancellation Note  Patient Details Name: Andrea Kennedy MRN: [id number] DOB: 09-27-1945   Cancelled Treatment:    Reason Eval/Treat Not Completed: Other (comment);Medical issues which prohibited therapy.  Pt has not had surgery yet.  Will see after sx and reorder. Thanks.  [name] 06/03/2015, 7:39 AM  [name], OTR/L [phone] 06/03/2015

## 2015-06-03 NOTE — Anesthesia Postprocedure Evaluation (Signed)
  Anesthesia Post-op Note  Patient: Andrea Kennedy  Procedure(s) Performed: Procedure(s): RIGHT TOTAL HIP ARTHROPLASTY ANTERIOR APPROACH (Right)  Patient is awake, responsive, moving her legs, and has signs of resolution of her numbness. Pain and nausea are reasonably well controlled. Vital signs are stable and clinically acceptable. Oxygen saturation is clinically acceptable. There are no apparent anesthetic complications at this time. Patient is ready for discharge.

## 2015-06-04 ENCOUNTER — Encounter (HOSPITAL_COMMUNITY): Payer: Self-pay | Admitting: Orthopedic Surgery

## 2015-06-04 DIAGNOSIS — S72001A Fracture of unspecified part of neck of right femur, initial encounter for closed fracture: Secondary | ICD-10-CM | POA: Insufficient documentation

## 2015-06-04 DIAGNOSIS — Z720 Tobacco use: Secondary | ICD-10-CM

## 2015-06-04 DIAGNOSIS — M5431 Sciatica, right side: Secondary | ICD-10-CM

## 2015-06-04 LAB — BASIC METABOLIC PANEL
ANION GAP: 4 — AB (ref 5–15)
BUN: 10 mg/dL (ref 6–20)
CALCIUM: 8.7 mg/dL — AB (ref 8.9–10.3)
CO2: 27 mmol/L (ref 22–32)
Chloride: 107 mmol/L (ref 101–111)
Creatinine, Ser: 0.86 mg/dL (ref 0.44–1.00)
GLUCOSE: 165 mg/dL — AB (ref 65–99)
POTASSIUM: 4.6 mmol/L (ref 3.5–5.1)
Sodium: 138 mmol/L (ref 135–145)

## 2015-06-04 LAB — CBC
HEMATOCRIT: 31.3 % — AB (ref 36.0–46.0)
Hemoglobin: 10.4 g/dL — ABNORMAL LOW (ref 12.0–15.0)
MCH: 33.7 pg (ref 26.0–34.0)
MCHC: 33.2 g/dL (ref 30.0–36.0)
MCV: 101.3 fL — AB (ref 78.0–100.0)
PLATELETS: 258 10*3/uL (ref 150–400)
RBC: 3.09 MIL/uL — ABNORMAL LOW (ref 3.87–5.11)
RDW: 15.4 % (ref 11.5–15.5)
WBC: 6.6 10*3/uL (ref 4.0–10.5)

## 2015-06-04 LAB — GLUCOSE, CAPILLARY: Glucose-Capillary: 83 mg/dL (ref 65–99)

## 2015-06-04 NOTE — Progress Notes (Signed)
   Subjective: 1 Day Post-Op Procedure(s) (LRB): RIGHT TOTAL HIP ARTHROPLASTY ANTERIOR APPROACH (Right) Patient reports pain as mild.   Patient seen in rounds with Dr. Wynelle Link. She is already feeling much better than prior to surgery. She is in good spirits this morning on rounds with Dr. Wynelle Link. Patient is well, and has had no acute complaints or problems except for some minor soreness in the thigh. We will start therapy today.  Plan is to go Skilled nursing facility after hospital stay.  She is on the medical service at this time.  From an ortho standpoint, they way she looks to day, she may be ready for rehab as early as tomorrow if stable and insurance approval.  Objective: Vital signs in last 24 hours: Temp:  [98 F (36.7 C)-99 F (37.2 C)] 98 F (36.7 C) (10/20 0456) Pulse Rate:  [54-70] 54 (10/20 0456) Resp:  [14-20] 14 (10/20 0456) BP: (96-158)/(46-96) 125/57 mmHg (10/20 0456) SpO2:  [95 %-100 %] 97 % (10/20 0456)  Intake/Output from previous day:  Intake/Output Summary (Last 24 hours) at 06/04/15 0833 Last data filed at 06/04/15 0636  Gross per 24 hour  Intake 3066.25 ml  Output   2620 ml  Net 446.25 ml    Labs:  Recent Labs  06/03/15 0248 06/03/15 0531 06/04/15 0447  HGB 12.1 10.4* 10.4*    Recent Labs  06/03/15 0531 06/04/15 0447  WBC 8.9 6.6  RBC 3.21* 3.09*  HCT 31.9* 31.3*  PLT 261 258    Recent Labs  06/03/15 0531 06/04/15 0447  NA 142 138  K 3.6 4.6  CL 110 107  CO2 25 27  BUN 20 10  CREATININE 0.91 0.86  GLUCOSE 110* 165*  CALCIUM 8.3* 8.7*    Recent Labs  06/03/15 0248  INR 1.06    EXAM General - Patient is Alert, Appropriate and Oriented Extremity - Neurovascular intact Sensation intact distally Dorsiflexion/Plantar flexion intact No cellulitis present Dressing - dressing C/D/I Motor Function - intact, moving foot and toes well on exam.  Hemovac pulled without difficulty.  Past Medical History  Diagnosis Date  .  Lupus ([place])   . Tobacco abuse   . Sciatica of right side     Assessment/Plan: 1 Day Post-Op Procedure(s) (LRB): RIGHT TOTAL HIP ARTHROPLASTY ANTERIOR APPROACH (Right) Principal Problem:   Fracture of hip, right, closed (H[facility]) Active Problems:   Lupus (H[facility])   Tobacco abuse   Sciatica of right side   Fall   Leukocytosis  Estimated body mass index is 19.37 kg/(m^2) as calculated from the following:   Height as of this encounter: '5\' 10"'$  (1.778 m).   Weight as of this encounter: 61.236 kg (135 lb). Advance diet Up with therapy Discharge to SNF once she is ready and insurance approves.  DVT Prophylaxis - Lovenox for a total of ten days and then an aspirin 81 mg daily for three additional weeks. Weight Bearing As Tolerated right Leg Hemovac Pulled Begin Therapy  Arlee Muslim, PA-C Orthopaedic Surgery 06/04/2015, 8:33 AM
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
## 2015-06-04 NOTE — Progress Notes (Signed)
Utilization review completed.  

## 2015-06-04 NOTE — Evaluation (Signed)
Occupational Therapy Evaluation Patient Details Name: Andrea Kennedy MRN: [id number] DOB: [birth year] Today's Date: 06/04/2015    History of Present Illness right total hip arthroplasty s/p fall ( anterior)   Clinical Impression   Pt is s/p THA resulting in the deficits listed below (see OT Problem List). Pt will benefit from skilled OT to increase their safety and independence with ADL and functional mobility for ADL to facilitate discharge to venue listed below.        Follow Up Recommendations  SNF    Equipment Recommendations  None recommended by OT    Recommendations for Other Services       Precautions / Restrictions Precautions Precautions: Fall Restrictions Weight Bearing Restrictions: No      Mobility Bed Mobility Overal bed mobility: Needs Assistance Bed Mobility: Supine to Sit     Supine to sit: Min assist     General bed mobility comments: Vc for safety  Transfers Overall transfer level: Needs assistance Equipment used: Rolling walker (2 wheeled) Transfers: Sit to/from Stand Sit to Stand: Min assist         General transfer comment: Vc for safety and hand placement         ADL Overall ADL's : Needs assistance/impaired     Grooming: Wash/dry hands;Standing;Minimal assistance   Upper Body Bathing: Set up   Lower Body Bathing: Moderate assistance;Sit to/from stand   Upper Body Dressing : Set up;Sitting   Lower Body Dressing: Moderate assistance;Sit to/from stand       Toileting- Water quality scientist and Hygiene: Minimal assistance;Sit to/from stand;Cueing for sequencing;Cueing for safety         General ADL Comments: pt has no A at home. Needs ST SNF               Pertinent Vitals/Pain Pain Assessment: 0-10 Pain Score: 4  Pain Location: R thigh Pain Descriptors / Indicators: Aching;Sore;Tightness Pain Intervention(s): Monitored during session;Premedicated before session;Ice applied     Hand Dominance      Extremity/Trunk Assessment Upper Extremity Assessment Upper Extremity Assessment: Overall WFL for tasks assessed           Communication Communication Communication: No difficulties   Cognition Arousal/Alertness: Awake/alert Behavior During Therapy: Anxious Overall Cognitive Status: Within Functional Limits for tasks assessed                                Home Living Family/patient expects to be discharged to:: Skilled nursing facility                                 Additional Comments: 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      Prior Functioning/Environment Level of Independence: Independent             OT Diagnosis: Generalized weakness   OT Problem List: Decreased strength;Decreased activity tolerance;Pain   OT Treatment/Interventions: Self-care/ADL training;DME and/or AE instruction;Patient/family education    OT Goals(Current goals can be found in the care plan section) Acute Rehab OT Goals Patient Stated Goal: go to rehab OT Goal Formulation: With patient Potential to Achieve Goals: Good  OT Frequency: Min 2X/week   Barriers to D/C: Decreased caregiver support             End of Session Equipment Utilized During Treatment: Rolling walker Nurse Communication: Mobility status  Activity Tolerance: Patient tolerated treatment well Patient left:  Other (comment) (with PT)   Time: 2174-7159 OT Time Calculation (min): 15 min Charges:  OT General Charges $OT Visit: 1 Procedure OT Evaluation $Initial OT Evaluation Tier I: 1 Procedure G-Codes:    Betsy Pries 06-08-2015, 11:49 AM

## 2015-06-04 NOTE — Clinical Social Work Placement (Signed)
   CLINICAL SOCIAL WORK PLACEMENT  NOTE  Date:  06/04/2015  Patient Details  Name: Avenell Sellers MRN: [id number] Date of Birth: [birth year]  Clinical Social Work is seeking post-discharge placement for this patient at the [place] level of care (*CSW will initial, date and re-position this form in  chart as items are completed):  Yes   Patient/family provided with [facility] Work Department's list of facilities offering this level of care within the geographic area requested by the patient (or if unable, by the patient's family).  Yes   Patient/family informed of their freedom to choose among providers that offer the needed level of care, that participate in Medicare, Medicaid or managed care program needed by the patient, have an available bed and are willing to accept the patient.  Yes   Patient/family informed of [facility]'s ownership interest in [facility] and [facility], as well as of the fact that they are under no obligation to receive care at these facilities.  PASRR submitted to EDS on 06/03/15     PASRR number received on 06/03/15     Existing PASRR number confirmed on       FL2 transmitted to all facilities in geographic area requested by pt/family on 06/04/15     FL2 transmitted to all facilities within larger geographic area on       Patient informed that his/her managed care company has contracts with or will negotiate with certain facilities, including the following:        Yes   Patient/family informed of bed offers received.  Patient chooses bed at [facility]     Physician recommends and patient chooses bed at      Patient to be transferred to [facility] on  .  Patient to be transferred to facility by       Patient family notified on   of transfer.  Name of family member notified:        PHYSICIAN       Additional Comment:       _______________________________________________ Luretha Rued, [place] 06/04/2015, 3:56 PM

## 2015-06-04 NOTE — Evaluation (Addendum)
Physical Therapy Evaluation Patient Details Name: Andrea Kennedy MRN: [id number] DOB: 27-Oct-1945 Today's Date: 06/04/2015   History of Present Illness  S/P right   total hip arthroplasty through anterior approach on 06/03/15 due to fall.  Clinical Impression  Patient required multimodal cues for safe use of the rolling walker, cues to not walk away from the RW prior to  Sitting down. Patient will benefit from PT to address  Problems listed in the note below to return to modified independent level.    Follow Up Recommendations SNF;Supervision/Assistance - 24 hour    Equipment Recommendations  Rolling walker with 5" wheels    Recommendations for Other Services       Precautions / Restrictions Precautions Precautions: Fall Restrictions Weight Bearing Restrictions: No      Mobility  Bed Mobility Overal bed mobility: Needs Assistance Bed Mobility: Supine to Sit     Supine to sit: Min assist     General bed mobility comments: Vc for safety  Transfers Overall transfer level: Needs assistance Equipment used: Rolling walker (2 wheeled) Transfers: Sit to/from Stand Sit to Stand: Min assist         General transfer comment: Vc for safety and hand placement, frequent multimodal safety cues, patient  attempts to walk away from the RW before truning around and backing up to recliner  Ambulation/Gait Ambulation/Gait assistance: Min assist Ambulation Distance (Feet): 50 Feet Assistive device: Rolling walker (2 wheeled) Gait Pattern/deviations: Step-to pattern;Decreased step length - right;Decreased stance time - right;Antalgic;Staggering left     General Gait Details: frequent multimodal cues for sequence and proper use of the rolling walker, tends to pick up the back and push it forward, therefore decreasing safety.  Stairs            Wheelchair Mobility    Modified Rankin (Stroke Patients Only)       Balance                                              Pertinent Vitals/Pain Pain Assessment: 0-10 Pain Score: 4  Pain Location: R thigh Pain Descriptors / Indicators: Aching;Sore;Tightness Pain Intervention(s): Monitored during session;Premedicated before session;Ice applied    Home Living Family/patient expects to be discharged to:: Skilled nursing facility   Available Help at Discharge: Family;Available PRN/intermittently Type of Home: House Home Access: Stairs to enter Entrance Stairs-Rails: Left Entrance Stairs-Number of Steps: 5 Home Layout: One level Home Equipment: None Additional Comments: 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    Prior Function Level of Independence: Independent               Hand Dominance        Extremity/Trunk Assessment   Upper Extremity Assessment: Defer to OT evaluation           Lower Extremity Assessment: RLE deficits/detail RLE Deficits / Details: able to advance the R leg    Cervical / Trunk Assessment: Normal  Communication   Communication: No difficulties  Cognition Arousal/Alertness: Awake/alert Behavior During Therapy: Anxious Overall Cognitive Status: Within Functional Limits for tasks assessed                      General Comments      Exercises        Assessment/Plan    PT Assessment Patient needs continued PT services  PT Diagnosis Difficulty walking;Acute pain;Abnormality  of gait   PT Problem List Decreased strength;Decreased range of motion;Decreased activity tolerance;Decreased mobility;Decreased knowledge of use of DME;Decreased safety awareness;Decreased knowledge of precautions;Pain  PT Treatment Interventions DME instruction;Gait training;Stair training;Functional mobility training;Therapeutic activities;Therapeutic exercise;Patient/family education   PT Goals (Current goals can be found in the Care Plan section) Acute Rehab PT Goals Patient Stated Goal: go to rehab PT Goal Formulation: With patient/family Time For  Goal Achievement: 06/18/15 Potential to Achieve Goals: Good    Frequency Min 6X/week   Barriers to discharge Decreased caregiver support  Multiple Steps to enter.      Co-evaluation               End of Session   Activity Tolerance: Patient tolerated treatment well Patient left: in chair;with call bell/phone within reach;with family/visitor present Nurse Communication: Mobility status         Time: 4782-9562 PT Time Calculation (min) (ACUTE ONLY): 28 min   Charges:   PT Evaluation $Initial PT Evaluation Tier I: 1 Procedure     PT G CodesClaretha Cooper 06/04/2015, 11:59 AM Tresa Endo PT [phone]

## 2015-06-04 NOTE — Care Management Note (Addendum)
Case Management Note  Patient Details  Name: Juliet Vasbinder MRN: [id number] Date of Birth: 1945/12/21  Subjective/Objective:                   RIGHT TOTAL HIP ARTHROPLASTY ANTERIOR APPROACH (Right) Action/Plan:  Discharge planning Expected Discharge Date:                  Expected Discharge Plan:  [place]  In-House Referral:     Discharge planning Services  CM Consult  Post Acute Care Choice:    Choice offered to:     DME Arranged:    DME Agency:     HH Arranged:    [place] Agency:     Status of Service:  Completed, signed off  Medicare Important Message Given:    Date Medicare IM Given:    Medicare IM give by:    Date Additional Medicare IM Given:    Additional Medicare Important Message give by:     If discussed at [place] of Stay Meetings, dates discussed:    Additional Comments: CM notes plan is for pt to go to SNF; CSW aware and arranging.  No other CM needs were communicated. Dellie Catholic, RN 06/04/2015, 12:16 PM

## 2015-06-04 NOTE — Progress Notes (Signed)
[facility] PROGRESS NOTE  Andrea Kennedy IRS:[id number] DOB: 06/27/1946 DOA: 06/03/2015 PCP: [name].  Assessment/Plan: #1 displaced right femoral neck fracture Secondary to mechanical in nature. Patient status post right total hip arthroplasty with no acute complications. PT/OT. Continue pain management. Likely need skilled nursing facility on discharge. Per orthopedics.  #2 leukocytosis Likely reactive leukocytosis. Chest x-ray negative for any acute infiltrate. Patient is afebrile. Leukocytosis has resolved. Follow.  #3 Lupus In remission. Outpatient follow-up.  #4 tobacco abuse  tobacco cessation. Nicotine patch.  #5 sciatica of the right side Pain management as needed with Tylenol and Percocet.  #6 prophylaxis Lovenox for DVT prophylaxis 10 days and then aspirin 81 mg daily 3 weeks per orthopedics.  Code Status: Full Family Communication: Updated patient and husband at bedside. Disposition Plan: Skilled nursing facility when bed available hopefully tomorrow.   Consultants:  Orthopedics: Dr. Wynelle Link 06/03/2015  Procedures:  Plain films of the pelvis 06/03/2015,   chest x-ray 06/03/2015  Plain films of the right hip and pelvis 06/03/2015  Right total hip arthroplasty per Dr. Wynelle Link 06/03/2015  Antibiotics:  None  HPI/Subjective: Patient states feeling better. Patient denies any pain. Patient standing up denies any pain in her hip. No shortness of breath. No chest pain.  Objective: Filed Vitals:   06/04/15 0830  BP: 117/54  Pulse: 64  Temp: 98.2 F (36.8 C)  Resp: 19    Intake/Output Summary (Last 24 hours) at 06/04/15 1048 Last data filed at 06/04/15 0900  Gross per 24 hour  Intake   2960 ml  Output   2650 ml  Net    310 ml   Filed Weights   06/03/15 0131 06/04/15 0830  Weight: 61.236 kg (135 lb) 61.236 kg (135 lb)    Exam:   General:  NAD  Cardiovascular: RRR  Respiratory: CTAB  Abdomen:  Soft/NT/ND/+BS  Musculoskeletal: No c/c/e  Data Reviewed: Basic Metabolic Panel:  Recent Labs Lab 06/03/15 0248 06/03/15 0531 06/04/15 0447  NA 139 142 138  K 4.3 3.6 4.6  CL 107 110 107  CO2 '23 25 27  '$ GLUCOSE 127* 110* 165*  BUN 25* 20 10  CREATININE 1.10* 0.91 0.86  CALCIUM 9.4 8.3* 8.7*   Liver Function Tests:  Recent Labs Lab 06/03/15 0531  AST 16  ALT 11*  ALKPHOS 43  BILITOT 0.6  PROT 6.0*  ALBUMIN 3.5   No results for input(s): LIPASE, AMYLASE in the last 168 hours. No results for input(s): AMMONIA in the last 168 hours. CBC:  Recent Labs Lab 06/03/15 0248 06/03/15 0531 06/04/15 0447  WBC 12.5* 8.9 6.6  NEUTROABS 10.8*  --   --   HGB 12.1 10.4* 10.4*  HCT 36.8 31.9* 31.3*  MCV 98.1 99.4 101.3*  PLT 282 261 258   Cardiac Enzymes: No results for input(s): CKTOTAL, CKMB, CKMBINDEX, TROPONINI in the last 168 hours. BNP (last 3 results) No results for input(s): BNP in the last 8760 hours.  ProBNP (last 3 results) No results for input(s): PROBNP in the last 8760 hours.  CBG:  Recent Labs Lab 06/03/15 0751  GLUCAP 86    Recent Results (from the past 240 hour(s))  Surgical pcr screen     Status: None   Collection Time: 06/03/15  1:00 PM  Result Value Ref Range Status   MRSA, PCR NEGATIVE NEGATIVE Final   Staphylococcus aureus NEGATIVE NEGATIVE Final    Comment:        The Xpert SA Assay (FDA  approved for NASAL specimens in patients over 44 years of age), is one component of a comprehensive surveillance program.  Test performance has been validated by [facility] for patients greater than or equal to 77 year old. It is not intended to diagnose infection nor to guide or monitor treatment.      Studies: Dg Chest 1 View  06/03/2015  CLINICAL DATA:  Preop right hip fracture. EXAM: CHEST 1 VIEW COMPARISON:  None. FINDINGS: The cardiomediastinal contours are normal. Mild hyperinflation with bronchitic markings. Pulmonary vasculature is  normal. No consolidation, pleural effusion, or pneumothorax. No acute osseous abnormalities are seen. IMPRESSION: Mild hyperinflation with increased bronchitic markings centrally, likely chronic. Electronically Signed   By: Jeb Levering M.D.   On: 06/03/2015 02:14   Pelvis Portable  06/03/2015  CLINICAL DATA:  Status post right hip replacement, history of subcapital femoral neck fracture EXAM: PORTABLE PELVIS 1-2 VIEWS COMPARISON:  Film from earlier in the same day FINDINGS: Pelvic ring is intact. A right hip replacement is noted. Surgical drain is noted in place. No acute bony abnormality is seen. No soft tissue abnormality is noted. IMPRESSION: Status post right hip replacement without acute abnormality. Electronically Signed   By: Inez Catalina M.D.   On: 06/03/2015 21:33   Dg C-arm 1-60 Min-no Report  06/03/2015  CLINICAL DATA: surgery C-ARM 1-60 MINUTES Fluoroscopy was utilized by the requesting physician.  No radiographic interpretation.   Dg Hip Unilat With Pelvis 2-3 Views Right  06/03/2015  CLINICAL DATA:  Acute onset of right hip pain, after falling down 2 steps. Felt pop, and unable to bear weight. Initial encounter. EXAM: DG HIP (WITH OR WITHOUT PELVIS) 2-3V RIGHT COMPARISON:  None. FINDINGS: There is a mildly displaced subcapital fracture through the right femoral neck. The right femoral head remains seated at the acetabulum. The left hip joint is grossly unremarkable in appearance. The sacroiliac joints are within normal limits. The visualized bowel gas pattern is grossly unremarkable. IMPRESSION: Mildly displaced subcapital fracture through the right femoral neck. Electronically Signed   By: Garald Balding M.D.   On: 06/03/2015 02:13    Scheduled Meds: . docusate sodium  100 mg Oral BID  . enoxaparin (LOVENOX) injection  40 mg Subcutaneous Q24H  . multivitamin with minerals  1 tablet Oral Daily  . nicotine  21 mg Transdermal Daily   Continuous Infusions: . sodium chloride 75  mL/hr at 06/03/15 2236    Principal Problem:   Fracture of hip, right, closed ([place]) Active Problems:   Lupus ([place])   Tobacco abuse   Sciatica of right side   Fall   Leukocytosis   Hip fracture, right ([place])    Time spent: 35 mins    [facility] MD [facility] Pager [phone]. If 7PM-7AM, please contact night-coverage at [email or web address], password [facility] 06/04/2015, 10:48 AM  LOS: 1 day

## 2015-06-05 DIAGNOSIS — S72001D Fracture of unspecified part of neck of right femur, subsequent encounter for closed fracture with routine healing: Secondary | ICD-10-CM

## 2015-06-05 DIAGNOSIS — D62 Acute posthemorrhagic anemia: Secondary | ICD-10-CM | POA: Diagnosis not present

## 2015-06-05 LAB — CBC
HCT: 27.5 % — ABNORMAL LOW (ref 36.0–46.0)
HEMOGLOBIN: 8.9 g/dL — AB (ref 12.0–15.0)
MCH: 32.4 pg (ref 26.0–34.0)
MCHC: 32.4 g/dL (ref 30.0–36.0)
MCV: 100 fL (ref 78.0–100.0)
Platelets: 237 10*3/uL (ref 150–400)
RBC: 2.75 MIL/uL — AB (ref 3.87–5.11)
RDW: 15.4 % (ref 11.5–15.5)
WBC: 7.3 10*3/uL (ref 4.0–10.5)

## 2015-06-05 LAB — IRON AND TIBC
IRON: 38 ug/dL (ref 28–170)
SATURATION RATIOS: 11 % (ref 10.4–31.8)
TIBC: 343 ug/dL (ref 250–450)
UIBC: 305 ug/dL

## 2015-06-05 LAB — VITAMIN B12: VITAMIN B 12: 241 pg/mL (ref 180–914)

## 2015-06-05 LAB — BASIC METABOLIC PANEL
Anion gap: 6 (ref 5–15)
BUN: 15 mg/dL (ref 6–20)
CHLORIDE: 104 mmol/L (ref 101–111)
CO2: 29 mmol/L (ref 22–32)
CREATININE: 0.77 mg/dL (ref 0.44–1.00)
Calcium: 8.7 mg/dL — ABNORMAL LOW (ref 8.9–10.3)
GFR calc non Af Amer: >60 mL/min (ref >60)
Glucose, Bld: 110 mg/dL — ABNORMAL HIGH (ref 65–99)
POTASSIUM: 4.2 mmol/L (ref 3.5–5.1)
SODIUM: 139 mmol/L (ref 135–145)

## 2015-06-05 LAB — FERRITIN: Ferritin: 196 ng/mL (ref 11–307)

## 2015-06-05 LAB — FOLATE: FOLATE: 13.7 ng/mL (ref >5.9)

## 2015-06-05 LAB — HEMOGLOBIN AND HEMATOCRIT, BLOOD
HCT: 30.6 % — ABNORMAL LOW (ref 36.0–46.0)
Hemoglobin: 9.9 g/dL — ABNORMAL LOW (ref 12.0–15.0)

## 2015-06-05 MED ORDER — OXYCODONE HCL 5 MG PO TABS: 5.0000 mg | ORAL_TABLET | ORAL | Status: DC | PRN

## 2015-06-05 MED ORDER — TRAMADOL HCL 50 MG PO TABS: 50.0000 mg | ORAL_TABLET | Freq: Four times a day (QID) | ORAL | Status: DC | PRN

## 2015-06-05 MED ORDER — NICOTINE 21 MG/24HR TD PT24: 21.0000 mg | MEDICATED_PATCH | Freq: Every day | TRANSDERMAL | Status: DC

## 2015-06-05 MED ORDER — CYCLOBENZAPRINE HCL 5 MG PO TABS: 5.0000 mg | ORAL_TABLET | Freq: Three times a day (TID) | ORAL | Status: DC | PRN

## 2015-06-05 MED ORDER — ENOXAPARIN SODIUM 40 MG/0.4ML ~~LOC~~ SOLN: 40.0000 mg | SUBCUTANEOUS | Status: DC

## 2015-06-05 MED ORDER — DOCUSATE SODIUM 100 MG PO CAPS: 100.0000 mg | ORAL_CAPSULE | Freq: Two times a day (BID) | ORAL | Status: DC
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
## 2015-06-05 NOTE — Progress Notes (Signed)
Pt's vitals are WNL, tolerating diet and pain is under control. Called to give report to [place] at [employer]. Answered all questions and concerns. Discharged with daughter driving pt to SNF.

## 2015-06-05 NOTE — Progress Notes (Signed)
Physical Therapy Treatment Patient Details Name: Andrea Kennedy MRN: [id number] DOB: [birth year] Today's Date: 06/05/2015    History of Present Illness right total hip arthroplasty s/p fall ( anterior)    PT Comments    Patient is progressing well, requiers cues for safe use og the rolling walker, tends to pick up the back  To slide.Recommend SNF.  Follow Up Recommendations  SNF;Supervision/Assistance - 24 hour     Equipment Recommendations  Rolling walker with 5" wheels    Recommendations for Other Services       Precautions / Restrictions Precautions Precautions: Fall    Mobility  Bed Mobility         Supine to sit: Supervision     General bed mobility comments: Vc for safety  Transfers   Equipment used: Rolling walker (2 wheeled) Transfers: Sit to/from Stand Sit to Stand: Min guard         General transfer comment: Vc for safety and hand placement, frequent multimodal safety cues,   Ambulation/Gait Ambulation/Gait assistance: Min assist Ambulation Distance (Feet): 50 Feet Assistive device: Rolling walker (2 wheeled) Gait Pattern/deviations: Step-to pattern;Decreased step length - right;Decreased stance time - right     General Gait Details: frequent multimodal cues for sequence and proper use of the rolling walker, tends to pick up the back and push it forward, therefore decreasing safety.   Stairs            Wheelchair Mobility    Modified Rankin (Stroke Patients Only)       Balance                                    Cognition Arousal/Alertness: Awake/alert Behavior During Therapy: Impulsive                        Exercises Total Joint Exercises Ankle Circles/Pumps: AROM;Both;10 reps Quad Sets: AROM;Both;10 reps Heel Slides: AAROM;Right;10 reps Hip ABduction/ADduction: AAROM;Right;10 reps Long Arc Quad: AROM;Right;10 reps    General Comments        Pertinent Vitals/Pain Pain Score: 2  Pain  Location: R thigh Pain Descriptors / Indicators: Tightness Pain Intervention(s): Monitored during session;Ice applied;Repositioned    Home Living                      Prior Function            PT Goals (current goals can now be found in the care plan section) Progress towards PT goals: Progressing toward goals    Frequency  Min 6X/week    PT Plan Current plan remains appropriate    Co-evaluation             End of Session   Activity Tolerance: Patient tolerated treatment well Patient left: in chair;with call bell/phone within reach;with family/visitor present;with chair alarm set     Time: 9373-4287 PT Time Calculation (min) (ACUTE ONLY): 27 min  Charges:  $Gait Training: 8-22 mins $Therapeutic Exercise: 8-22 mins                    G Codes:      Claretha Cooper 06/05/2015, 12:59 PM

## 2015-06-05 NOTE — Progress Notes (Addendum)
   Subjective: 2 Days Post-Op Procedure(s) (LRB): RIGHT TOTAL HIP ARTHROPLASTY ANTERIOR APPROACH (Right) Patient reports pain as mild.   Patient seen in rounds by Dr. Wynelle Link. Patient is well, and has had no acute complaints or problems Patient is ready to go to the SNF once cleared by Medicine service.  Objective: Vital signs in last 24 hours: Temp:  [98.2 F (36.8 C)-99 F (37.2 C)] 98.5 F (36.9 C) (10/21 0547) Pulse Rate:  [64-73] 67 (10/21 0547) Resp:  [18-19] 18 (10/21 0547) BP: (107-126)/(54-60) 126/56 mmHg (10/21 0547) SpO2:  [96 %-100 %] 98 % (10/21 0547)  Intake/Output from previous day:  Intake/Output Summary (Last 24 hours) at 06/05/15 1046 Last data filed at 06/05/15 0947  Gross per 24 hour  Intake    720 ml  Output    975 ml  Net   -255 ml    Intake/Output this shift: Total I/O In: 240 [P.O.:240] Out: 450 [Urine:450]  Labs:  Recent Labs  06/03/15 0248 06/03/15 0531 06/04/15 0447 06/05/15 0500  HGB 12.1 10.4* 10.4* 8.9*    Recent Labs  06/04/15 0447 06/05/15 0500  WBC 6.6 7.3  RBC 3.09* 2.75*  HCT 31.3* 27.5*  PLT 258 237    Recent Labs  06/04/15 0447 06/05/15 0500  NA 138 139  K 4.6 4.2  CL 107 104  CO2 27 29  BUN 10 15  CREATININE 0.86 0.77  GLUCOSE 165* 110*  CALCIUM 8.7* 8.7*    Recent Labs  06/03/15 0248  INR 1.06    EXAM: General - Patient is Alert, Appropriate and Oriented Extremity - Neurovascular intact Sensation intact distally Dorsiflexion/Plantar flexion intact Incision - clean, dry, no drainage, healing Motor Function - intact, moving foot and toes well on exam.   Assessment/Plan: 2 Days Post-Op Procedure(s) (LRB): RIGHT TOTAL HIP ARTHROPLASTY ANTERIOR APPROACH (Right) Procedure(s) (LRB): RIGHT TOTAL HIP ARTHROPLASTY ANTERIOR APPROACH (Right) Past Medical History  Diagnosis Date  . Lupus ([place])   . Tobacco abuse   . Sciatica of right side    Principal Problem:   Fracture of hip, right, closed  (HCC) Active Problems:   Lupus ([place])   Tobacco abuse   Sciatica of right side   Fall   Leukocytosis   Hip fracture, right (HCC)   Postoperative anemia due to acute blood loss  Estimated body mass index is 19.37 kg/(m^2) as calculated from the following:   Height as of this encounter: '5\' 10"'$  (1.778 m).   Weight as of this encounter: 61.236 kg (135 lb). Up with therapy Discharge to SNF once cleared by medicine Diet - Regular diet Follow up - in 2 weeks Activity - WBAT Disposition - Skilled nursing facility Condition Upon Discharge - Stable at the time of this note D/C Meds - See DC Summary DVT Prophylaxis - Lovenox for a total of 10 days and then Aspirin 81 mg daily for three additional weeks.  RX printed out for probable discharge today.  Arlee Muslim, PA-C Orthopaedic Surgery 06/05/2015, 10:46 AM

## 2015-06-05 NOTE — Discharge Instructions (Signed)
° °Dr. Frank Aluisio °Total Joint Specialist °[facility] °[address] °[place], [place] [address] °[phone] ° °ANTERIOR APPROACH TOTAL HIP REPLACEMENT POSTOPERATIVE DIRECTIONS ° ° °Hip Rehabilitation, Guidelines Following Surgery  °The results of a hip operation are greatly improved after range of motion and muscle strengthening exercises. Follow all safety measures which are given to protect your hip. If any of these exercises cause increased pain or swelling in your joint, decrease the amount until you are comfortable again. Then slowly increase the exercises. Call your caregiver if you have problems or questions.  ° °HOME CARE INSTRUCTIONS  °Remove items at home which could result in a fall. This includes throw rugs or furniture in walking pathways.  °· ICE to the affected hip every three hours for 30 minutes at a time and then as needed for pain and swelling.  Continue to use ice on the hip for pain and swelling from surgery. You may notice swelling that will progress down to the foot and ankle.  This is normal after surgery.  Elevate the leg when you are not up walking on it.   °· Continue to use the breathing machine which will help keep your temperature down.  It is common for your temperature to cycle up and down following surgery, especially at night when you are not up moving around and exerting yourself.  The breathing machine keeps your lungs expanded and your temperature down. ° ° °DIET °You may resume your previous home diet once your are discharged from the hospital. ° °DRESSING / WOUND CARE / SHOWERING °You may shower 3 days after surgery, but keep the wounds dry during showering.  You may use an occlusive plastic wrap (Press'n Seal for example), NO SOAKING/SUBMERGING IN THE BATHTUB.  If the bandage gets wet, change with a clean dry gauze.  If the incision gets wet, pat the wound dry with a clean towel. °You may start showering once you are discharged home but do not  submerge the incision under water. Just pat the incision dry and apply a dry gauze dressing on daily. °Change the surgical dressing daily and reapply a dry dressing each time. ° °ACTIVITY °Walk with your walker as instructed. °Use walker as long as suggested by your caregivers. °Avoid periods of inactivity such as sitting longer than an hour when not asleep. This helps prevent blood clots.  °You may resume a sexual relationship in one month or when given the OK by your doctor.  °You may return to work once you are cleared by your doctor.  °Do not drive a car for 6 weeks or until released by you surgeon.  °Do not drive while taking narcotics. ° °WEIGHT BEARING °Weight bearing as tolerated with assist device (walker, cane, etc) as directed, use it as long as suggested by your surgeon or therapist, typically at least 4-6 weeks. ° °POSTOPERATIVE CONSTIPATION PROTOCOL °Constipation - defined medically as fewer than three stools per week and severe constipation as less than one stool per week. ° °One of the most common issues patients have following surgery is constipation.  Even if you have a regular bowel pattern at home, your normal regimen is likely to be disrupted due to multiple reasons following surgery.  Combination of anesthesia, postoperative narcotics, change in appetite and fluid intake all can affect your bowels.  In order to avoid complications following surgery, here are some recommendations in order to help you during your recovery period. ° °Colace (docusate) - Pick up an over-the-counter   form of Colace or another stool softener and take twice a day as long as you are requiring postoperative pain medications.  Take with a full glass of water daily.  If you experience loose stools or diarrhea, hold the colace until you stool forms back up.  If your symptoms do not get better within 1 week or if they get worse, check with your doctor. ° °Dulcolax (bisacodyl) - Pick up over-the-counter and take as directed  by the product packaging as needed to assist with the movement of your bowels.  Take with a full glass of water.  Use this product as needed if not relieved by Colace only.  ° °MiraLax (polyethylene glycol) - Pick up over-the-counter to have on hand.  MiraLax is a solution that will increase the amount of water in your bowels to assist with bowel movements.  Take as directed and can mix with a glass of water, juice, soda, coffee, or tea.  Take if you go more than two days without a movement. °Do not use MiraLax more than once per day. Call your doctor if you are still constipated or irregular after using this medication for 7 days in a row. ° °If you continue to have problems with postoperative constipation, please contact the office for further assistance and recommendations.  If you experience "the worst abdominal pain ever" or develop nausea or vomiting, please contact the office immediatly for further recommendations for treatment. ° °ITCHING ° If you experience itching with your medications, try taking only a single pain pill, or even half a pain pill at a time.  You can also use Benadryl over the counter for itching or also to help with sleep.  ° °TED HOSE STOCKINGS °Wear the elastic stockings on both legs for three weeks following surgery during the day but you may remove then at night for sleeping. ° °MEDICATIONS °See your medication summary on the “After Visit Summary” that the nursing staff will review with you prior to discharge.  You may have some home medications which will be placed on hold until you complete the course of blood thinner medication.  It is important for you to complete the blood thinner medication as prescribed by your surgeon.  Continue your approved medications as instructed at time of discharge. ° °PRECAUTIONS °If you experience chest pain or shortness of breath - call 911 immediately for transfer to the hospital emergency department.  °If you develop a fever greater that 101 F,  purulent drainage from wound, increased redness or drainage from wound, foul odor from the wound/dressing, or calf pain - CONTACT YOUR SURGEON.   °                                                °FOLLOW-UP APPOINTMENTS °Make sure you keep all of your appointments after your operation with your surgeon and caregivers. You should call the office at the above phone number and make an appointment for approximately two weeks after the date of your surgery or on the date instructed by your surgeon outlined in the "After Visit Summary". ° °RANGE OF MOTION AND STRENGTHENING EXERCISES  °These exercises are designed to help you keep full movement of your hip joint. Follow your caregiver's or physical therapist's instructions. Perform all exercises about fifteen times, three times per day or as directed. Exercise both hips, even if you   have had only one joint replacement. These exercises can be done on a training (exercise) mat, on the floor, on a table or on a bed. Use whatever works the best and is most comfortable for you. Use music or television while you are exercising so that the exercises are a pleasant break in your day. This will make your life better with the exercises acting as a break in routine you can look forward to.  Lying on your back, slowly slide your foot toward your buttocks, raising your knee up off the floor. Then slowly slide your foot back down until your leg is straight again.  Lying on your back spread your legs as far apart as you can without causing discomfort.  Lying on your side, raise your upper leg and foot straight up from the floor as far as is comfortable. Slowly lower the leg and repeat.  Lying on your back, tighten up the muscle in the front of your thigh (quadriceps muscles). You can do this by keeping your leg straight and trying to raise your heel off the floor. This helps strengthen the largest muscle supporting your knee.  Lying on your back, tighten up the muscles of your  buttocks both with the legs straight and with the knee bent at a comfortable angle while keeping your heel on the floor.   IF YOU ARE TRANSFERRED TO A SKILLED REHAB FACILITY If the patient is transferred to a skilled rehab facility following release from the hospital, a list of the current medications will be sent to the facility for the patient to continue.  When discharged from the skilled rehab facility, please have the facility set up the patient's [place] prior to being released. Also, the skilled facility will be responsible for providing the patient with their medications at time of release from the facility to include their pain medication, the muscle relaxants, and their blood thinner medication. If the patient is still at the rehab facility at time of the two week follow up appointment, the skilled rehab facility will also need to assist the patient in arranging follow up appointment in our office and any transportation needs.  MAKE SURE YOU:  Understand these instructions.  Get help right away if you are not doing well or get worse.    Pick up stool softner and laxative for home use following surgery while on pain medications. Do not submerge incision under water. Please use good hand washing techniques while changing dressing each day. May shower starting three days after surgery. Please use a clean towel to pat the incision dry following showers. Continue to use ice for pain and swelling after surgery. Do not use any lotions or creams on the incision until instructed by your surgeon.  Continue Lovenox injections for eight more days and then can discontinue Lovenox injections. Once the patient has completed the blood thinner regimen, then take a Baby 81 mg Aspirin daily for three more weeks.

## 2015-06-05 NOTE — Progress Notes (Signed)
CSW assisting with d/c planning. Pt has a ST Rehab bed at [facility] [place] today if stable for d/c.  Werner Lean LCSW [phone]

## 2015-06-05 NOTE — Clinical Social Work Placement (Signed)
   CLINICAL SOCIAL WORK PLACEMENT  NOTE  Date:  06/05/2015  Patient Details  Name: Andrea Kennedy MRN: [id number] Date of Birth: 1946/03/18  Clinical Social Work is seeking post-discharge placement for this patient at the [place] level of care (*CSW will initial, date and re-position this form in  chart as items are completed):  Yes   Patient/family provided with [facility] Work Department's list of facilities offering this level of care within the geographic area requested by the patient (or if unable, by the patient's family).  Yes   Patient/family informed of their freedom to choose among providers that offer the needed level of care, that participate in Medicare, Medicaid or managed care program needed by the patient, have an available bed and are willing to accept the patient.  Yes   Patient/family informed of [facility]'s ownership interest in [facility] and [facility], as well as of the fact that they are under no obligation to receive care at these facilities.  PASRR submitted to EDS on 06/03/15     PASRR number received on 06/03/15     Existing PASRR number confirmed on       FL2 transmitted to all facilities in geographic area requested by pt/family on 06/04/15     FL2 transmitted to all facilities within larger geographic area on       Patient informed that his/her managed care company has contracts with or will negotiate with certain facilities, including the following:        Yes   Patient/family informed of bed offers received.  Patient chooses bed at [facility]     Physician recommends and patient chooses bed at      Patient to be transferred to [facility] on 06/05/15.  Patient to be transferred to facility by [facility]     Patient family notified on 06/05/15 of transfer.  Name of family member notified:  DAUGHTER     PHYSICIAN       Additional Comment: Pt / daughter are in agreement  with d/c to [facility] today. [employer] medicare has provided authorization. PT has approved transport by car. NSG has reviewed d/c summary, script, avs. Scripts included in d/c packet. DC Summary sent to SNF for review prior to d/c. D/C packet provided to pt prior to d/c.   _______________________________________________ Luretha Rued, LCSW  [phone] 06/05/2015, 3:45 PM

## 2015-06-05 NOTE — Discharge Summary (Signed)
Physician Discharge Summary  Andrea Kennedy IWP:[id number] DOB: Jul 05, 1946 DOA: 06/03/2015  PCP: [name].  Admit date: 06/03/2015 Discharge date: 06/05/2015  Time spent: 60 minutes  Recommendations for Outpatient Follow-up:  1. Follow-up with Dr. Wynelle Link, orthopedics in 2 weeks. 2. Follow-up with M.D. at skilled nursing facility. Patient will need a CBC done in 1 week to follow-up on H&H.  Discharge Diagnoses:  Principal Problem:   Fracture of hip, right, closed ([place]) Active Problems:   Lupus ([place])   Tobacco abuse   Sciatica of right side   Fall   Leukocytosis   Hip fracture, right (HCC)   Postoperative anemia due to acute blood loss   Discharge Condition: Stable and improved  Diet recommendation: Regular  Filed Weights   06/03/15 0131 06/04/15 0830  Weight: 61.236 kg (135 lb) 61.236 kg (135 lb)    History of present illness:  Per Dr Yves Dill is a 69 y.o. female with PMH of lupus on remission, tobacco abuse, right-sided sciatica, who presented with a fall and right hip pain.  Patient reported that she had fallen 2 days prior to admission,  down a short flight of steps and landing on the right side. Patient stated that the accident happened after she argued with her husband at home and was very emotional and tripped her steps at that moment. She did not have dizziness, chest pain, shortness of breath, unilateral weakness. She developed soreness over right hip. She stated it felt like a pulled muscle. She took ibuprofen and continued to work until the day of admission. She stated while at work earlier she twisted and felt a pop in R hip with subsequent excruciating pain. She felt like her right hip was swollen. She had been unable to ambulate since then. She stated that she has right sciatica with mild numbness in the right leg from knee down to feet chronically, which has been slightly worsening. She did not have weakness or decreased sensation  in her right leg. Patient did not have fever, chills, cough, shortness of breath, abdominal pain, diarrhea, symptoms of UTI, unilateral weakness.  In ED, patient was found to have right mild displaced subcapital fracture though femoral neck. WBC 12.5, temperature normal, no tachycardia, electrolytes okay. CXR showed mild hyperinflation with increased bronchitic markings centrally, likely chronic. Patient was admitted to inpatient for further evaluation and treatment. Orthopedic surgeon was consulted by ED.   Hospital Course:  #1 displaced right femoral neck fracture Secondary to mechanical fall. Patient was admitted and orthopedics consulted. Patient subsequently underwent  right total hip arthroplasty with no acute complications. Patient's pain was managed and she was seen by physical therapy. Patient will be discharged to a skilled nursing facility and will follow-up with orthopedics 2 weeks post discharge. Patient will be discharged on Lovenox and then subsequently aspirin for DVT prophylaxis.  #2 postoperative acute blood loss anemia Patient was noted to have a hemoglobin of 8.9 from 12.1 on admission postoperatively. Patient had no overt bleeding. Anemia panel was drawn with a iron level of 38 TIBC of 343 ferritin of 196 and a folate level of 13.7 and a B 12 level of 241. Repeat H&H was done and hemoglobin had stabilized at 9.9. Outpatient follow-up.  #3 leukocytosis Likely reactive leukocytosis. Chest x-ray negative for any acute infiltrate. Patient remained afebrile. Patient did not have any urinary or respiratory symptoms. Patient's leukocytosis resolved by day of discharge.   #4 Lupus In remission. Outpatient follow-up.  #5 tobacco  abuse  tobacco cessation. Nicotine patch placed during hospitalization.  #6 sciatica of the right side Pain management as needed with Tylenol and Percocet.   Procedures:  Plain films of the pelvis 06/03/2015,   chest x-ray 06/03/2015  Plain films  of the right hip and pelvis 06/03/2015  Right total hip arthroplasty per Dr. Wynelle Link 06/03/2015    Consultations:  Orthopedics: Dr. Wynelle Link 06/03/2015  Discharge Exam: Filed Vitals:   06/05/15 1503  BP: 130/64  Pulse: 70  Temp: 98.3 F (36.8 C)  Resp: 18    General: NAD Cardiovascular: RRR Respiratory: CTAB  Discharge Instructions   Discharge Instructions    Call MD / Call 911    Complete by:  As directed   If you experience chest pain or shortness of breath, CALL 911 and be transported to the hospital emergency room.  If you develope a fever above 101 F, pus (white drainage) or increased drainage or redness at the wound, or calf pain, call your surgeon's office.     Change dressing    Complete by:  As directed   You may change your dressing dressing daily with sterile 4 x 4 inch gauze dressing and paper tape.  Do not submerge the incision under water.     Constipation Prevention    Complete by:  As directed   Drink plenty of fluids.  Prune juice may be helpful.  You may use a stool softener, such as Colace (over the counter) 100 mg twice a day.  Use MiraLax (over the counter) for constipation as needed.     Diet general    Complete by:  As directed      Diet general    Complete by:  As directed      Discharge instructions    Complete by:  As directed   Pick up stool softner and laxative for home use following surgery while on pain medications. Do not submerge incision under water. Please use good hand washing techniques while changing dressing each day. May shower starting three days after surgery. Please use a clean towel to pat the incision dry following showers. Continue to use ice for pain and swelling after surgery. Do not use any lotions or creams on the incision until instructed by your surgeon.  Total Hip Protocol.  Continue Lovenox injections for eight more days and then can discontinue Lovenox injections. Once the patient has completed the blood thinner  regimen, then take a Baby 81 mg Aspirin daily for three more weeks.  Postoperative Constipation Protocol  Constipation - defined medically as fewer than three stools per week and severe constipation as less than one stool per week.  One of the most common issues patients have following surgery is constipation.  Even if you have a regular bowel pattern at home, your normal regimen is likely to be disrupted due to multiple reasons following surgery.  Combination of anesthesia, postoperative narcotics, change in appetite and fluid intake all can affect your bowels.  In order to avoid complications following surgery, here are some recommendations in order to help you during your recovery period.  Colace (docusate) - Pick up an over-the-counter form of Colace or another stool softener and take twice a day as long as you are requiring postoperative pain medications.  Take with a full glass of water daily.  If you experience loose stools or diarrhea, hold the colace until you stool forms back up.  If your symptoms do not get better within 1 week or if  they get worse, check with your doctor.  Dulcolax (bisacodyl) - Pick up over-the-counter and take as directed by the product packaging as needed to assist with the movement of your bowels.  Take with a full glass of water.  Use this product as needed if not relieved by Colace only.   MiraLax (polyethylene glycol) - Pick up over-the-counter to have on hand.  MiraLax is a solution that will increase the amount of water in your bowels to assist with bowel movements.  Take as directed and can mix with a glass of water, juice, soda, coffee, or tea.  Take if you go more than two days without a movement. Do not use MiraLax more than once per day. Call your doctor if you are still constipated or irregular after using this medication for 7 days in a row.  If you continue to have problems with postoperative constipation, please contact the office for further assistance  and recommendations.  If you experience "the worst abdominal pain ever" or develop nausea or vomiting, please contact the office immediatly for further recommendations for treatment.  When discharged from the skilled rehab facility, please have the facility set up the patient's [place] prior to being released.  Please make sure this gets set up prior to release in order to avoid any lapse of therapy following the rehab stay.  Also provide the patient with their medications at time of release from the facility to include their pain medication, the muscle relaxants, and their blood thinner medication.  If the patient is still at the rehab facility at time of follow up appointment, please also assist the patient in arranging follow up appointment in our office and any transportation needs. ICE to the affected knee or hip every three hours for 30 minutes at a time and then as needed for pain and swelling.     Do not sit on low chairs, stoools or toilet seats, as it may be difficult to get up from low surfaces    Complete by:  As directed      Driving restrictions    Complete by:  As directed   No driving until released by the physician.     Full weight bearing    Complete by:  As directed   Laterality:  right  Extremity:  Lower     Increase activity slowly as tolerated    Complete by:  As directed      Increase activity slowly    Complete by:  As directed      Lifting restrictions    Complete by:  As directed   No lifting until released by the physician.     Patient may shower    Complete by:  As directed   You may shower without a dressing once there is no drainage.  Do not wash over the wound.  If drainage remains, do not shower until drainage stops.     TED hose    Complete by:  As directed   Use stockings (TED hose) for 3 weeks on both leg(s).  You may remove them at night for sleeping.     Weight bearing as tolerated    Complete by:  As directed   Laterality:  right   Extremity:  Lower          Current Discharge Medication List    START taking these medications   Details  cyclobenzaprine (FLEXERIL) 5 MG tablet Take 1 tablet (5 mg total) by mouth  3 (three) times daily as needed for muscle spasms. Qty: 90 tablet, Refills: 0    docusate sodium (COLACE) 100 MG capsule Take 1 capsule (100 mg total) by mouth 2 (two) times daily. Qty: 10 capsule, Refills: 0    enoxaparin (LOVENOX) 40 MG/0.4ML injection Inject 0.4 mLs (40 mg total) into the skin daily. Continue Lovenox injections for eight more days and then discontinue the Lovenox. Once the patient has completed the blood thinner regimen, then take a Baby 81 mg Aspirin daily for three more weeks. Qty: 8 Syringe, Refills: 0    nicotine (NICODERM CQ - DOSED IN MG/24 HOURS) 21 mg/24hr patch Place 1 patch (21 mg total) onto the skin daily. Qty: 28 patch, Refills: 0    oxyCODONE (OXY IR/ROXICODONE) 5 MG immediate release tablet Take 1-2 tablets (5-10 mg total) by mouth every 4 (four) hours as needed for moderate pain or severe pain ((for MODERATE breakthrough pain)). Qty: 90 tablet, Refills: 0    traMADol (ULTRAM) 50 MG tablet Take 1-2 tablets (50-100 mg total) by mouth every 6 (six) hours as needed (mild pain). Qty: 80 tablet, Refills: 1      CONTINUE these medications which have NOT CHANGED   Details  acetaminophen (TYLENOL) 325 MG tablet Take 650 mg by mouth every 6 (six) hours as needed (for headache.).    diphenhydramine-acetaminophen (TYLENOL PM) 25-500 MG TABS tablet Take 1-2 tablets by mouth at bedtime as needed (for sleep).    Multiple Vitamins-Minerals (ALIVE WOMENS 50+ PO) Take 1 tablet by mouth daily.      STOP taking these medications     ibuprofen (ADVIL,MOTRIN) 200 MG tablet        Allergies  Allergen Reactions  . Floxin [Ofloxacin] Other (See Comments)    Scared/shaky/pain attacks/confusion   Follow-up Information    Follow up with Gearlean Alf, MD. Go on 06/16/2015.    Specialty:  Orthopedic Surgery   Why:  For wound re-check at 3:00 PM at [facility]   Contact information:   [address] [place] [address] [phone]       Please follow up.   Why:  f/u with MD at SNF      Follow up with Gearlean Alf, MD. Go on 06/18/2015.   Specialty:  Orthopedic Surgery   Why:  at 3:45 pm    Contact information:   [address] [place] [address] [place] [address] [phone]        The results of significant diagnostics from this hospitalization (including imaging, microbiology, ancillary and laboratory) are listed below for reference.    Significant Diagnostic Studies: Dg Chest 1 View  06/03/2015  CLINICAL DATA:  Preop right hip fracture. EXAM: CHEST 1 VIEW COMPARISON:  None. FINDINGS: The cardiomediastinal contours are normal. Mild hyperinflation with bronchitic markings. Pulmonary vasculature is normal. No consolidation, pleural effusion, or pneumothorax. No acute osseous abnormalities are seen. IMPRESSION: Mild hyperinflation with increased bronchitic markings centrally, likely chronic. Electronically Signed   By: Jeb Levering M.D.   On: 06/03/2015 02:14   Pelvis Portable  06/03/2015  CLINICAL DATA:  Status post right hip replacement, history of subcapital femoral neck fracture EXAM: PORTABLE PELVIS 1-2 VIEWS COMPARISON:  Film from earlier in the same day FINDINGS: Pelvic ring is intact. A right hip replacement is noted. Surgical drain is noted in place. No acute bony abnormality is seen. No soft tissue abnormality is noted. IMPRESSION: Status post right hip replacement without acute abnormality. Electronically Signed   By: Elta Guadeloupe  Lukens M.D.   On: 06/03/2015 21:33   Dg C-arm 1-60 Min-no Report  06/03/2015  CLINICAL DATA: surgery C-ARM 1-60 MINUTES Fluoroscopy was utilized by the requesting physician.  No radiographic interpretation.   Dg Hip Unilat With Pelvis 2-3 Views Right  06/03/2015  CLINICAL DATA:   Acute onset of right hip pain, after falling down 2 steps. Felt pop, and unable to bear weight. Initial encounter. EXAM: DG HIP (WITH OR WITHOUT PELVIS) 2-3V RIGHT COMPARISON:  None. FINDINGS: There is a mildly displaced subcapital fracture through the right femoral neck. The right femoral head remains seated at the acetabulum. The left hip joint is grossly unremarkable in appearance. The sacroiliac joints are within normal limits. The visualized bowel gas pattern is grossly unremarkable. IMPRESSION: Mildly displaced subcapital fracture through the right femoral neck. Electronically Signed   By: Garald Balding M.D.   On: 06/03/2015 02:13    Microbiology: Recent Results (from the past 240 hour(s))  Surgical pcr screen     Status: None   Collection Time: 06/03/15  1:00 PM  Result Value Ref Range Status   MRSA, PCR NEGATIVE NEGATIVE Final   Staphylococcus aureus NEGATIVE NEGATIVE Final    Comment:        The Xpert SA Assay (FDA approved for NASAL specimens in patients over 5 years of age), is one component of a comprehensive surveillance program.  Test performance has been validated by [facility] for patients greater than or equal to 16 year old. It is not intended to diagnose infection nor to guide or monitor treatment.      Labs: Basic Metabolic Panel:  Recent Labs Lab 06/03/15 0248 06/03/15 0531 06/04/15 0447 06/05/15 0500  NA 139 142 138 139  K 4.3 3.6 4.6 4.2  CL 107 110 107 104  CO2 '23 25 27 29  '$ GLUCOSE 127* 110* 165* 110*  BUN 25* '20 10 15  '$ CREATININE 1.10* 0.91 0.86 0.77  CALCIUM 9.4 8.3* 8.7* 8.7*   Liver Function Tests:  Recent Labs Lab 06/03/15 0531  AST 16  ALT 11*  ALKPHOS 43  BILITOT 0.6  PROT 6.0*  ALBUMIN 3.5   No results for input(s): LIPASE, AMYLASE in the last 168 hours. No results for input(s): AMMONIA in the last 168 hours. CBC:  Recent Labs Lab 06/03/15 0248 06/03/15 0531 06/04/15 0447 06/05/15 0500 06/05/15 1355  WBC 12.5* 8.9  6.6 7.3  --   NEUTROABS 10.8*  --   --   --   --   HGB 12.1 10.4* 10.4* 8.9* 9.9*  HCT 36.8 31.9* 31.3* 27.5* 30.6*  MCV 98.1 99.4 101.3* 100.0  --   PLT 282 261 258 237  --    Cardiac Enzymes: No results for input(s): CKTOTAL, CKMB, CKMBINDEX, TROPONINI in the last 168 hours. BNP: BNP (last 3 results) No results for input(s): BNP in the last 8760 hours.  ProBNP (last 3 results) No results for input(s): PROBNP in the last 8760 hours.  CBG:  Recent Labs Lab 06/03/15 0751 06/04/15 1049  GLUCAP 86 83       Signed:  THOMPSON,DANIEL MD [facility] 06/05/2015, 3:21 PM

## 2015-10-29 ENCOUNTER — Encounter (HOSPITAL_COMMUNITY): Payer: Self-pay | Admitting: Emergency Medicine

## 2015-10-29 ENCOUNTER — Emergency Department (INDEPENDENT_AMBULATORY_CARE_PROVIDER_SITE_OTHER)
Admission: EM | Admit: 2015-10-29 | Discharge: 2015-10-29 | Disposition: A | Discharge status: Home / Self Care | Payer: Medicare HMO | Source: Home / Self Care | Attending: Family Medicine | Admitting: Family Medicine

## 2015-10-29 DIAGNOSIS — G8929 Other chronic pain: Secondary | ICD-10-CM

## 2015-10-29 DIAGNOSIS — M25551 Pain in right hip: Secondary | ICD-10-CM | POA: Diagnosis not present

## 2015-10-29 MED ORDER — MELOXICAM 7.5 MG PO TABS: 7.5000 mg | ORAL_TABLET | Freq: Every day | ORAL | Status: DC

## 2015-10-29 NOTE — ED Notes (Signed)
Pt had Hip replacement surgery last October.  She was followed up by her surgeon in December with x-rays and was given Oxycodone for pain.  She was in rehab for a while and was taking Flexeril, Oxycodone and Tramadol.  She is now out of her pain medication and her surgeon prescribed her Robaxin by phone.  Pt states she is 8-9/10 pain with ambulation.

## 2015-10-29 NOTE — ED Provider Notes (Signed)
CSN: [id number]     Arrival date & time 10/29/15  1303 History   First MD Initiated Contact with Patient 10/29/15 1424     Chief Complaint  Patient presents with  . Back Pain   (Consider location/radiation/quality/duration/timing/severity/associated sxs/prior Treatment) Patient is a 70 y.o. female presenting with back pain. The history is provided by the patient. No language interpreter was used.  Back Pain Associated symptoms: no dysuria and no fever   Patient presents with complaint of pain in R buttock/hip, present since R hip replacement in October 2016 after fall and fx.  She reports she was in rehab at [facility], discharged and had been on oxycodone until mid-January. Pain worsened after she went off oxycodone, is requesting this for pain.  Has been taking Robaxin '500mg'$  tid for muscle spasms.  Has taken Tramadol in the past, does not help.  Reports pain worse when she bends over, also getting up from sitting to standing and initiating ambulation. Does not have cane or walker with her today.  Reports that she had seen her orthopedic surgeon in December for this pain, had repeat films and reportedly without clear findings to explain pain. Pain stays in R buttock, does not radiate down leg or up torso/flank.   Past Medical History  Diagnosis Date  . Lupus ([place])   . Tobacco abuse   . Sciatica of right side    Past Surgical History  Procedure Laterality Date  . Tubal ligation    . Total hip arthroplasty Right 06/03/2015    Procedure: RIGHT TOTAL HIP ARTHROPLASTY ANTERIOR APPROACH;  Surgeon: Gaynelle Arabian, MD;  Location: [facility]S;  Service: Orthopedics;  Laterality: Right;   Family History  Problem Relation Age of Onset  . Breast cancer Mother   . Diabetes Mother   . Heart attack Father   . Fibromyalgia Sister    Social History  Substance Use Topics  . Smoking status: Former Smoker    Quit date: 07/01/2015  . Smokeless tobacco: None  . Alcohol Use: No   OB History    No data  available     Review of Systems  Constitutional: Negative for fever, chills and appetite change.  Genitourinary: Negative for dysuria and difficulty urinating.  Musculoskeletal: Positive for back pain.  Neurological:       No loss of control of bowels or bladder (no incontinence), no saddle anesthesia. No lower extremity weakness or falls.   All other systems reviewed and are negative.   Allergies  Floxin  Home Medications   Prior to Admission medications   Medication Sig Start Date End Date Taking? Authorizing Provider  diphenhydramine-acetaminophen (TYLENOL PM) 25-500 MG TABS tablet Take 1-2 tablets by mouth at bedtime as needed (for sleep).   Yes Historical Provider, MD  methocarbamol (ROBAXIN) 500 MG tablet Take 500 mg by mouth 3 (three) times daily.   Yes Historical Provider, MD  Multiple Vitamins-Minerals (ALIVE WOMENS 50+ PO) Take 1 tablet by mouth daily.   Yes Historical Provider, MD  acetaminophen (TYLENOL) 325 MG tablet Take 650 mg by mouth every 6 (six) hours as needed (for headache.).    Historical Provider, MD  cyclobenzaprine (FLEXERIL) 5 MG tablet Take 1 tablet (5 mg total) by mouth 3 (three) times daily as needed for muscle spasms. 06/05/15   Arlee Muslim, PA-C  docusate sodium (COLACE) 100 MG capsule Take 1 capsule (100 mg total) by mouth 2 (two) times daily. 06/05/15   Eugenie Filler, MD  enoxaparin (LOVENOX) 40 MG/0.4ML injection  Inject 0.4 mLs (40 mg total) into the skin daily. Continue Lovenox injections for eight more days and then discontinue the Lovenox. Once the patient has completed the blood thinner regimen, then take a Baby 81 mg Aspirin daily for three more weeks. 06/05/15   Arlee Muslim, PA-C  meloxicam (MOBIC) 7.5 MG tablet Take 1 tablet (7.5 mg total) by mouth daily. 10/29/15   Willeen Niece, MD  nicotine (NICODERM CQ - DOSED IN MG/24 HOURS) 21 mg/24hr patch Place 1 patch (21 mg total) onto the skin daily. 06/05/15   Eugenie Filler, MD  oxyCODONE (OXY  IR/ROXICODONE) 5 MG immediate release tablet Take 1-2 tablets (5-10 mg total) by mouth every 4 (four) hours as needed for moderate pain or severe pain ((for MODERATE breakthrough pain)). 06/05/15   Arlee Muslim, PA-C  traMADol (ULTRAM) 50 MG tablet Take 1-2 tablets (50-100 mg total) by mouth every 6 (six) hours as needed (mild pain). 06/05/15   Arlee Muslim, PA-C   Meds Ordered and Administered this Visit  Medications - No data to display  BP 160/79 mmHg  Pulse 67  Temp(Src) 98.3 F (36.8 C) (Oral)  Resp 20  SpO2 97% No data found.   Physical Exam  Constitutional: She appears well-developed and well-nourished. No distress.  Musculoskeletal:  Able to ambulate independently without assistance.   Able to stand on toes and heels, maintains balance well.     Neurological:  Heel and toe-walking intact.  No clonus.  Strength in ankle dorsi/plantar flexion, hip flexion, and knee flexion/extension full and symmetric.   DTRs patellar 2+ symmetrically.   Sensation in feet full and symmetric. Palpable dp pulses bilaterally.  Brisk cap refill in toes of both feet.   SLR greater than 60 degrees bilaterally.   Skin: She is not diaphoretic.    ED Course  Procedures (including critical care time)  Labs Review Labs Reviewed - No data to display  Imaging Review No results found.   Visual Acuity Review  Right Eye Distance:   Left Eye Distance:   Bilateral Distance:    Right Eye Near:   Left Eye Near:    Bilateral Near:         MDM   1. Chronic right hip pain    R hip and buttock pain. Full passive ROM both hips; no neuro deficits on exam.  Trial Meloxicam 7.'5mg'$  daily; she is advised to establish with a primary care doctor, and to return to her orthopedist for further evaluation of the pain in the R hip six months after her surgery.    Dalbert Mayotte, MD    Willeen Niece, MD 10/29/15 [phone]

## 2015-10-29 NOTE — Discharge Instructions (Signed)
It is a pleasure to see you today for the pain in your right buttock/hip.   As we discussed, it is unclear why you continue to have this degree of pain several months after your surgery.  I recommend following up with your surgeon, and to establish care with a primary care doctor.   I am prescribing you MELOXICAM 7.'5mg'$  tablets, take 1 tablet by mouth one time daily for pain.

## 2016-07-20 ENCOUNTER — Emergency Department (HOSPITAL_COMMUNITY): Payer: Medicare HMO

## 2016-07-20 ENCOUNTER — Encounter (HOSPITAL_COMMUNITY): Payer: Self-pay

## 2016-07-20 ENCOUNTER — Inpatient Hospital Stay (HOSPITAL_COMMUNITY): Payer: Medicare HMO

## 2016-07-20 ENCOUNTER — Inpatient Hospital Stay (HOSPITAL_COMMUNITY)
Admission: EM | Admit: 2016-07-20 | Discharge: 2016-07-25 | DRG: 062 | Disposition: A | Discharge status: Rehabilitation Facility | Payer: Medicare HMO | Attending: Neurology | Admitting: Neurology

## 2016-07-20 DIAGNOSIS — R29715 NIHSS score 15: Secondary | ICD-10-CM | POA: Diagnosis present

## 2016-07-20 DIAGNOSIS — E7800 Pure hypercholesterolemia, unspecified: Secondary | ICD-10-CM | POA: Diagnosis present

## 2016-07-20 DIAGNOSIS — R471 Dysarthria and anarthria: Secondary | ICD-10-CM | POA: Diagnosis present

## 2016-07-20 DIAGNOSIS — F17210 Nicotine dependence, cigarettes, uncomplicated: Secondary | ICD-10-CM | POA: Diagnosis present

## 2016-07-20 DIAGNOSIS — I639 Cerebral infarction, unspecified: Secondary | ICD-10-CM | POA: Diagnosis present

## 2016-07-20 DIAGNOSIS — I69322 Dysarthria following cerebral infarction: Secondary | ICD-10-CM

## 2016-07-20 DIAGNOSIS — G8194 Hemiplegia, unspecified affecting left nondominant side: Secondary | ICD-10-CM | POA: Diagnosis not present

## 2016-07-20 DIAGNOSIS — I69320 Aphasia following cerebral infarction: Secondary | ICD-10-CM | POA: Diagnosis not present

## 2016-07-20 DIAGNOSIS — G8191 Hemiplegia, unspecified affecting right dominant side: Secondary | ICD-10-CM

## 2016-07-20 DIAGNOSIS — R4701 Aphasia: Secondary | ICD-10-CM | POA: Diagnosis present

## 2016-07-20 DIAGNOSIS — N179 Acute kidney failure, unspecified: Secondary | ICD-10-CM | POA: Diagnosis present

## 2016-07-20 DIAGNOSIS — I6340 Cerebral infarction due to embolism of unspecified cerebral artery: Principal | ICD-10-CM | POA: Diagnosis present

## 2016-07-20 DIAGNOSIS — I69398 Other sequelae of cerebral infarction: Secondary | ICD-10-CM | POA: Diagnosis not present

## 2016-07-20 DIAGNOSIS — R41844 Frontal lobe and executive function deficit: Secondary | ICD-10-CM | POA: Diagnosis not present

## 2016-07-20 DIAGNOSIS — I10 Essential (primary) hypertension: Secondary | ICD-10-CM | POA: Diagnosis present

## 2016-07-20 DIAGNOSIS — E785 Hyperlipidemia, unspecified: Secondary | ICD-10-CM

## 2016-07-20 DIAGNOSIS — F1410 Cocaine abuse, uncomplicated: Secondary | ICD-10-CM | POA: Diagnosis present

## 2016-07-20 DIAGNOSIS — Z23 Encounter for immunization: Secondary | ICD-10-CM | POA: Diagnosis present

## 2016-07-20 DIAGNOSIS — R32 Unspecified urinary incontinence: Secondary | ICD-10-CM | POA: Diagnosis present

## 2016-07-20 DIAGNOSIS — Z96641 Presence of right artificial hip joint: Secondary | ICD-10-CM | POA: Diagnosis present

## 2016-07-20 DIAGNOSIS — M5431 Sciatica, right side: Secondary | ICD-10-CM | POA: Diagnosis present

## 2016-07-20 DIAGNOSIS — M329 Systemic lupus erythematosus, unspecified: Secondary | ICD-10-CM | POA: Diagnosis present

## 2016-07-20 DIAGNOSIS — F1490 Cocaine use, unspecified, uncomplicated: Secondary | ICD-10-CM | POA: Diagnosis not present

## 2016-07-20 DIAGNOSIS — R29810 Facial weakness: Secondary | ICD-10-CM | POA: Diagnosis present

## 2016-07-20 DIAGNOSIS — R252 Cramp and spasm: Secondary | ICD-10-CM | POA: Diagnosis not present

## 2016-07-20 DIAGNOSIS — Z720 Tobacco use: Secondary | ICD-10-CM

## 2016-07-20 DIAGNOSIS — I63419 Cerebral infarction due to embolism of unspecified middle cerebral artery: Secondary | ICD-10-CM | POA: Diagnosis not present

## 2016-07-20 DIAGNOSIS — R2990 Unspecified symptoms and signs involving the nervous system: Secondary | ICD-10-CM

## 2016-07-20 DIAGNOSIS — I6789 Other cerebrovascular disease: Secondary | ICD-10-CM

## 2016-07-20 DIAGNOSIS — R1312 Dysphagia, oropharyngeal phase: Secondary | ICD-10-CM | POA: Diagnosis not present

## 2016-07-20 DIAGNOSIS — I63032 Cerebral infarction due to thrombosis of left carotid artery: Secondary | ICD-10-CM | POA: Diagnosis not present

## 2016-07-20 DIAGNOSIS — I480 Paroxysmal atrial fibrillation: Secondary | ICD-10-CM | POA: Diagnosis present

## 2016-07-20 DIAGNOSIS — F17200 Nicotine dependence, unspecified, uncomplicated: Secondary | ICD-10-CM | POA: Diagnosis not present

## 2016-07-20 DIAGNOSIS — I638 Other cerebral infarction: Secondary | ICD-10-CM | POA: Diagnosis not present

## 2016-07-20 DIAGNOSIS — R269 Unspecified abnormalities of gait and mobility: Secondary | ICD-10-CM | POA: Diagnosis not present

## 2016-07-20 DIAGNOSIS — I63412 Cerebral infarction due to embolism of left middle cerebral artery: Secondary | ICD-10-CM | POA: Diagnosis not present

## 2016-07-20 LAB — RAPID URINE DRUG SCREEN, HOSP PERFORMED
Amphetamines: NOT DETECTED
BARBITURATES: NOT DETECTED
Benzodiazepines: NOT DETECTED
COCAINE: POSITIVE — AB
Opiates: NOT DETECTED
Tetrahydrocannabinol: NOT DETECTED

## 2016-07-20 LAB — I-STAT CHEM 8, ED
BUN: 23 mg/dL — ABNORMAL HIGH (ref 6–20)
CHLORIDE: 105 mmol/L (ref 101–111)
Calcium, Ion: 1.17 mmol/L (ref 1.15–1.40)
Creatinine, Ser: 1 mg/dL (ref 0.44–1.00)
Glucose, Bld: 112 mg/dL — ABNORMAL HIGH (ref 65–99)
HEMATOCRIT: 38 % (ref 36.0–46.0)
HEMOGLOBIN: 12.9 g/dL (ref 12.0–15.0)
POTASSIUM: 3.9 mmol/L (ref 3.5–5.1)
SODIUM: 140 mmol/L (ref 135–145)
TCO2: 25 mmol/L (ref 0–100)

## 2016-07-20 LAB — CBC
HEMATOCRIT: 37.9 % (ref 36.0–46.0)
HEMOGLOBIN: 12.4 g/dL (ref 12.0–15.0)
MCH: 33.2 pg (ref 26.0–34.0)
MCHC: 32.7 g/dL (ref 30.0–36.0)
MCV: 101.3 fL — ABNORMAL HIGH (ref 78.0–100.0)
Platelets: 270 10*3/uL (ref 150–400)
RBC: 3.74 MIL/uL — ABNORMAL LOW (ref 3.87–5.11)
RDW: 15.1 % (ref 11.5–15.5)
WBC: 7.3 10*3/uL (ref 4.0–10.5)

## 2016-07-20 LAB — COMPREHENSIVE METABOLIC PANEL
ALK PHOS: 48 U/L (ref 38–126)
ALT: 12 U/L — ABNORMAL LOW (ref 14–54)
ANION GAP: 9 (ref 5–15)
AST: 24 U/L (ref 15–41)
Albumin: 4 g/dL (ref 3.5–5.0)
BILIRUBIN TOTAL: 0.8 mg/dL (ref 0.3–1.2)
BUN: 18 mg/dL (ref 6–20)
CALCIUM: 9.5 mg/dL (ref 8.9–10.3)
CO2: 23 mmol/L (ref 22–32)
Chloride: 106 mmol/L (ref 101–111)
Creatinine, Ser: 1.1 mg/dL — ABNORMAL HIGH (ref 0.44–1.00)
GFR calc Af Amer: 58 mL/min — ABNORMAL LOW (ref >60)
GFR, EST NON AFRICAN AMERICAN: 50 mL/min — AB (ref >60)
Glucose, Bld: 113 mg/dL — ABNORMAL HIGH (ref 65–99)
POTASSIUM: 3.8 mmol/L (ref 3.5–5.1)
Sodium: 138 mmol/L (ref 135–145)
TOTAL PROTEIN: 6.6 g/dL (ref 6.5–8.1)

## 2016-07-20 LAB — DIFFERENTIAL
Basophils Absolute: 0 10*3/uL (ref 0.0–0.1)
Basophils Relative: 1 %
EOS ABS: 0.2 10*3/uL (ref 0.0–0.7)
EOS PCT: 3 %
LYMPHS ABS: 1.7 10*3/uL (ref 0.7–4.0)
LYMPHS PCT: 24 %
MONOS PCT: 7 %
Monocytes Absolute: 0.5 10*3/uL (ref 0.1–1.0)
Neutro Abs: 4.8 10*3/uL (ref 1.7–7.7)
Neutrophils Relative %: 65 %

## 2016-07-20 LAB — CBG MONITORING, ED: GLUCOSE-CAPILLARY: 107 mg/dL — AB (ref 65–99)

## 2016-07-20 LAB — LIPID PANEL
CHOLESTEROL: 181 mg/dL (ref 0–200)
HDL: 62 mg/dL (ref >40)
LDL Cholesterol: 102 mg/dL — ABNORMAL HIGH (ref 0–99)
Total CHOL/HDL Ratio: 2.9 RATIO
Triglycerides: 85 mg/dL (ref <150)
VLDL: 17 mg/dL (ref 0–40)

## 2016-07-20 LAB — URINALYSIS, ROUTINE W REFLEX MICROSCOPIC
Bilirubin Urine: NEGATIVE
GLUCOSE, UA: NEGATIVE mg/dL
Hgb urine dipstick: NEGATIVE
KETONES UR: NEGATIVE mg/dL
LEUKOCYTES UA: NEGATIVE
NITRITE: NEGATIVE
PROTEIN: NEGATIVE mg/dL
Specific Gravity, Urine: 1.012 (ref 1.005–1.030)
pH: 7 (ref 5.0–8.0)

## 2016-07-20 LAB — I-STAT TROPONIN, ED: TROPONIN I, POC: 0.01 ng/mL (ref 0.00–0.08)

## 2016-07-20 LAB — ETHANOL: Alcohol, Ethyl (B): <5 mg/dL (ref <5)

## 2016-07-20 LAB — ECHOCARDIOGRAM COMPLETE
Height: 66 in
Weight: 2190.49 oz

## 2016-07-20 LAB — APTT: aPTT: 29 seconds (ref 24–36)

## 2016-07-20 LAB — PROTIME-INR
INR: 1.05
Prothrombin Time: 13.7 seconds (ref 11.4–15.2)

## 2016-07-20 LAB — MRSA PCR SCREENING: MRSA by PCR: NEGATIVE

## 2016-07-20 MED ORDER — OXYCODONE HCL 5 MG PO TABS: 5.0000 mg | ORAL_TABLET | ORAL | Status: DC | PRN

## 2016-07-20 MED ORDER — ACETAMINOPHEN 325 MG PO TABS: 650.0000 mg | ORAL_TABLET | ORAL | Status: DC | PRN

## 2016-07-20 MED ORDER — ZOLPIDEM TARTRATE 5 MG PO TABS: 5.0000 mg | ORAL_TABLET | Freq: Every evening | ORAL | Status: DC | PRN

## 2016-07-20 MED ORDER — ALTEPLASE (STROKE) FULL DOSE INFUSION
0.9000 mg/kg | Freq: Once | INTRAVENOUS | Status: AC
  Administered 2016-07-20: 57 mg via INTRAVENOUS
  Filled 2016-07-20: qty 100

## 2016-07-20 MED ORDER — SENNOSIDES-DOCUSATE SODIUM 8.6-50 MG PO TABS: 1.0000 | ORAL_TABLET | Freq: Every evening | ORAL | Status: DC | PRN

## 2016-07-20 MED ORDER — MELOXICAM 7.5 MG PO TABS
7.5000 mg | ORAL_TABLET | Freq: Every day | ORAL | Status: DC
  Filled 2016-07-20: qty 1

## 2016-07-20 MED ORDER — TRAMADOL HCL 50 MG PO TABS
50.0000 mg | ORAL_TABLET | Freq: Four times a day (QID) | ORAL | Status: DC | PRN
  Administered 2016-07-21: 50 mg via ORAL
  Administered 2016-07-22 – 2016-07-24 (×3): 100 mg via ORAL
  Filled 2016-07-20: qty 2
  Filled 2016-07-20: qty 1
  Filled 2016-07-20 (×3): qty 2

## 2016-07-20 MED ORDER — SODIUM CHLORIDE 0.9 % IV SOLN
INTRAVENOUS | Status: DC
  Administered 2016-07-20 – 2016-07-25 (×4): via INTRAVENOUS

## 2016-07-20 MED ORDER — ADULT MULTIVITAMIN W/MINERALS CH
1.0000 | ORAL_TABLET | Freq: Every day | ORAL | Status: DC
  Administered 2016-07-20 – 2016-07-25 (×6): 1 via ORAL
  Filled 2016-07-20 (×6): qty 1

## 2016-07-20 MED ORDER — LABETALOL HCL 5 MG/ML IV SOLN
5.0000 mg | Freq: Once | INTRAVENOUS | Status: AC
  Administered 2016-07-20: 5 mg via INTRAVENOUS

## 2016-07-20 MED ORDER — SODIUM CHLORIDE 0.9 % IV SOLN
50.0000 mL | Freq: Once | INTRAVENOUS | Status: AC
  Administered 2016-07-20: 50 mL via INTRAVENOUS

## 2016-07-20 MED ORDER — INFLUENZA VAC SPLIT QUAD 0.5 ML IM SUSY: 0.5000 mL | PREFILLED_SYRINGE | INTRAMUSCULAR | Status: DC | PRN

## 2016-07-20 MED ORDER — IOPAMIDOL (ISOVUE-370) INJECTION 76%
INTRAVENOUS | Status: AC
  Filled 2016-07-20: qty 50

## 2016-07-20 MED ORDER — NICARDIPINE HCL IN NACL 20-0.86 MG/200ML-% IV SOLN
3.0000 mg/h | INTRAVENOUS | Status: DC
  Administered 2016-07-20: 5 mg/h via INTRAVENOUS

## 2016-07-20 MED ORDER — METHOCARBAMOL 500 MG PO TABS
500.0000 mg | ORAL_TABLET | Freq: Three times a day (TID) | ORAL | Status: DC
  Administered 2016-07-20 – 2016-07-25 (×16): 500 mg via ORAL
  Filled 2016-07-20 (×16): qty 1

## 2016-07-20 MED ORDER — FENTANYL CITRATE (PF) 100 MCG/2ML IJ SOLN
25.0000 ug | INTRAMUSCULAR | Status: DC | PRN
  Administered 2016-07-20 (×3): 25 ug via INTRAVENOUS
  Administered 2016-07-21: 50 ug via INTRAVENOUS
  Administered 2016-07-21 – 2016-07-22 (×2): 25 ug via INTRAVENOUS
  Filled 2016-07-20 (×6): qty 2

## 2016-07-20 MED ORDER — ORAL CARE MOUTH RINSE
15.0000 mL | Freq: Two times a day (BID) | OROMUCOSAL | Status: DC
  Administered 2016-07-20 – 2016-07-25 (×10): 15 mL via OROMUCOSAL

## 2016-07-20 MED ORDER — CYCLOBENZAPRINE HCL 10 MG PO TABS
5.0000 mg | ORAL_TABLET | Freq: Three times a day (TID) | ORAL | Status: DC | PRN
  Administered 2016-07-23: 5 mg via ORAL
  Filled 2016-07-20: qty 1

## 2016-07-20 MED ORDER — STROKE: EARLY STAGES OF RECOVERY BOOK
Freq: Once | Status: AC
  Administered 2016-07-20: 1
  Filled 2016-07-20: qty 1

## 2016-07-20 MED ORDER — NICOTINE 21 MG/24HR TD PT24
21.0000 mg | MEDICATED_PATCH | Freq: Every day | TRANSDERMAL | Status: DC
  Administered 2016-07-20 – 2016-07-24 (×5): 21 mg via TRANSDERMAL
  Filled 2016-07-20 (×6): qty 1

## 2016-07-20 MED ORDER — PANTOPRAZOLE SODIUM 40 MG IV SOLR
40.0000 mg | Freq: Every day | INTRAVENOUS | Status: DC
  Administered 2016-07-20: 40 mg via INTRAVENOUS
  Filled 2016-07-20: qty 40

## 2016-07-20 MED ORDER — NICARDIPINE HCL IN NACL 20-0.86 MG/200ML-% IV SOLN
INTRAVENOUS | Status: AC
  Filled 2016-07-20: qty 200

## 2016-07-20 MED ORDER — ACETAMINOPHEN 325 MG PO TABS: 650.0000 mg | ORAL_TABLET | Freq: Four times a day (QID) | ORAL | Status: DC | PRN

## 2016-07-20 MED ORDER — ACETAMINOPHEN 650 MG RE SUPP: 650.0000 mg | RECTAL | Status: DC | PRN

## 2016-07-20 MED ORDER — LABETALOL HCL 5 MG/ML IV SOLN
10.0000 mg | INTRAVENOUS | Status: DC | PRN
  Filled 2016-07-20: qty 4

## 2016-07-20 MED ORDER — IOPAMIDOL (ISOVUE-370) INJECTION 76%
50.0000 mL | Freq: Once | INTRAVENOUS | Status: AC | PRN
  Administered 2016-07-20: 50 mL via INTRAVENOUS

## 2016-07-20 MED ORDER — DOCUSATE SODIUM 100 MG PO CAPS
100.0000 mg | ORAL_CAPSULE | Freq: Two times a day (BID) | ORAL | Status: DC
  Administered 2016-07-20 – 2016-07-25 (×10): 100 mg via ORAL
  Filled 2016-07-20 (×11): qty 1

## 2016-07-20 NOTE — Progress Notes (Signed)
Inpatient Rehabilitation  Speech therapy has recommended that pt. will benefit from CIR.  PT and OT consults are pending as pt. remains on bedrest following tPA. Will follow along for recommendations.    [place] Admissions Coordinator Cell [phone] Office [phone]

## 2016-07-20 NOTE — ED Provider Notes (Signed)
[facility] DEPT Provider Note   CSN: [id number] Arrival date & time: 07/20/16  9381  By signing my name below, I, Arianna Nassar, attest that this documentation has been prepared under the direction and in the presence of Merryl Hacker, MD.  Electronically Signed: Julien Nordmann, ED Scribe. 07/20/16. 3:13 AM.    History   Chief Complaint Chief Complaint  Patient presents with  . Code Stroke    The history is provided by the EMS personnel and the spouse. The history is limited by the condition of the patient. No language interpreter was used.   HPI Comments: Andrea Kennedy is a 70 y.o. female who presents to the Emergency Department presenting with code stroke. Last seen normal was 0115. She has right sided weakness, right sided facial droop, right neglect and aphasia. Per husband, pt began to have sudden onset right sided facial droop and aphasia this evening per husband. He states that pt began to start shaking her left hand for about twenty minutes and began to have to bladder incontinence. Per husband, pt has no hx of cva or seizures, brain tumor head trauma or recent surgeries.  Level 5 caveat.  Past Medical History:  Diagnosis Date  . Lupus   . Sciatica of right side   . Tobacco abuse     Patient Active Problem List   Diagnosis Date Noted  . Postoperative anemia due to acute blood loss 06/05/2015  . Hip fracture, right ([place])   . Fracture of hip, right, closed ([place]) 06/03/2015  . Fall 06/03/2015  . Leukocytosis 06/03/2015  . Lupus   . Tobacco abuse   . Sciatica of right side     Past Surgical History:  Procedure Laterality Date  . TOTAL HIP ARTHROPLASTY Right 06/03/2015   Procedure: RIGHT TOTAL HIP ARTHROPLASTY ANTERIOR APPROACH;  Surgeon: Gaynelle Arabian, MD;  Location: [facility];  Service: Orthopedics;  Laterality: Right;  . TUBAL LIGATION      OB History    No data available       Home Medications    Prior to Admission medications   Medication Sig  Start Date End Date Taking? Authorizing Provider  acetaminophen (TYLENOL) 325 MG tablet Take 650 mg by mouth every 6 (six) hours as needed (for headache.).    Historical Provider, MD  cyclobenzaprine (FLEXERIL) 5 MG tablet Take 1 tablet (5 mg total) by mouth 3 (three) times daily as needed for muscle spasms. 06/05/15   Alexzandrew L Perkins, PA-C  diphenhydramine-acetaminophen (TYLENOL PM) 25-500 MG TABS tablet Take 1-2 tablets by mouth at bedtime as needed (for sleep).    Historical Provider, MD  docusate sodium (COLACE) 100 MG capsule Take 1 capsule (100 mg total) by mouth 2 (two) times daily. 06/05/15   Eugenie Filler, MD  enoxaparin (LOVENOX) 40 MG/0.4ML injection Inject 0.4 mLs (40 mg total) into the skin daily. Continue Lovenox injections for eight more days and then discontinue the Lovenox. Once the patient has completed the blood thinner regimen, then take a Baby 81 mg Aspirin daily for three more weeks. 06/05/15   Alexzandrew L Perkins, PA-C  meloxicam (MOBIC) 7.5 MG tablet Take 1 tablet (7.5 mg total) by mouth daily. 10/29/15   Willeen Niece, MD  methocarbamol (ROBAXIN) 500 MG tablet Take 500 mg by mouth 3 (three) times daily.    Historical Provider, MD  Multiple Vitamins-Minerals (ALIVE WOMENS 50+ PO) Take 1 tablet by mouth daily.    Historical Provider, MD  nicotine (NICODERM CQ - DOSED  IN MG/24 HOURS) 21 mg/24hr patch Place 1 patch (21 mg total) onto the skin daily. 06/05/15   Eugenie Filler, MD  oxyCODONE (OXY IR/ROXICODONE) 5 MG immediate release tablet Take 1-2 tablets (5-10 mg total) by mouth every 4 (four) hours as needed for moderate pain or severe pain ((for MODERATE breakthrough pain)). 06/05/15   Alexzandrew L Perkins, PA-C  traMADol (ULTRAM) 50 MG tablet Take 1-2 tablets (50-100 mg total) by mouth every 6 (six) hours as needed (mild pain). 06/05/15   Alexzandrew Monika Salk, PA-C    Family History Family History  Problem Relation Age of Onset  . Breast cancer Mother   .  Diabetes Mother   . Heart attack Father   . Fibromyalgia Sister     Social History Social History  Substance Use Topics  . Smoking status: Former Smoker    Quit date: 07/01/2015  . Smokeless tobacco: Never Used  . Alcohol use No     Allergies   Floxin [ofloxacin]   Review of Systems Review of Systems  Unable to perform ROS: Acuity of condition  Neurological: Positive for facial asymmetry, speech difficulty and weakness.  All other systems reviewed and are negative.  LEVEL 5 CAVEAT FOR STROKE  Physical Exam Updated Vital Signs BP 170/85   Pulse 71   Resp 14   Wt 140 lb 3.4 oz (63.6 kg)   SpO2 100%   BMI 20.12 kg/m   Physical Exam  Constitutional:  Ill-appearing anxious, ABC's intact  HENT:  Head: Normocephalic and atraumatic.  Eyes: Pupils are equal, round, and reactive to light.  Pupils 3 mm reactive bilaterally, gaze deviated to the left but can cross midline to the right, right-sided neglect noted  Cardiovascular: Normal rate, regular rhythm and normal heart sounds.   No murmur heard. Pulmonary/Chest: Effort normal and breath sounds normal. No respiratory distress. She has no wheezes.  Abdominal: Soft. Bowel sounds are normal. There is no tenderness. There is no guarding.  Neurological: She is alert.  Alert, unable to assess orientation, and aphasia noted, right-sided facial droop, right upper greater than lower extremity weakness, 0 out of 5 right upper extremity strength, 3 out of 5 right lower extremity strength with drift  Skin: Skin is warm and dry.  Psychiatric: She has a normal mood and affect.  Nursing note and vitals reviewed.    ED Treatments / Results  DIAGNOSTIC STUDIES: Oxygen Saturation is 94% on RA, low by my interpretation.  COORDINATION OF CARE:  3:13 AM Discussed treatment plan with pt at bedside and pt agreed to plan.  Labs (all labs ordered are listed, but only abnormal results are displayed) Labs Reviewed  CBC - Abnormal;  Notable for the following:       Result Value   RBC 3.74 (*)    MCV 101.3 (*)    All other components within normal limits  COMPREHENSIVE METABOLIC PANEL - Abnormal; Notable for the following:    Glucose, Bld 113 (*)    Creatinine, Ser 1.10 (*)    ALT 12 (*)    GFR calc non Af Amer 50 (*)    GFR calc Af Amer 58 (*)    All other components within normal limits  I-STAT CHEM 8, ED - Abnormal; Notable for the following:    BUN 23 (*)    Glucose, Bld 112 (*)    All other components within normal limits  CBG MONITORING, ED - Abnormal; Notable for the following:    Glucose-Capillary 107 (*)  All other components within normal limits  ETHANOL  PROTIME-INR  APTT  DIFFERENTIAL  RAPID URINE DRUG SCREEN, HOSP PERFORMED  URINALYSIS, ROUTINE W REFLEX MICROSCOPIC  I-STAT TROPOININ, ED    EKG  EKG Interpretation None       Radiology Ct Head Code Stroke Wo Contrast`  Result Date: 07/20/2016 CLINICAL DATA:  Code stroke. Last seen normal at 1 15 a.m.: RIGHT-sided weakness, RIGHT facial droop and aphasia. EXAM: CT HEAD WITHOUT CONTRAST TECHNIQUE: Contiguous axial images were obtained from the base of the skull through the vertex without intravenous contrast. COMPARISON:  None. FINDINGS: BRAIN: The ventricles and sulci are normal for age. No intraparenchymal hemorrhage, mass effect nor midline shift. Patchy supratentorial white matter hypodensities within normal range for patient's age, though non-specific are most compatible with chronic small vessel ischemic disease. Small area of focal blurring LEFT posterior frontal lobe/ precentral gyrus. No abnormal extra-axial fluid collections. Basal cisterns are patent. VASCULAR: Moderate calcific atherosclerosis of the carotid siphons. SKULL: No skull fracture. No significant scalp soft tissue swelling. SINUSES/ORBITS: The mastoid air-cells and included paranasal sinuses are well-aerated.The included ocular globes and orbital contents are non-suspicious.  OTHER: None. ASPECTS [facility] Stroke Program Early CT Score) - Ganglionic level infarction (caudate, lentiform nuclei, internal capsule, insula, M1-M3 cortex): 7 - Supraganglionic infarction (M4-M6 cortex): 6 Total score (0-10 with 10 being normal): 9 IMPRESSION: 1. Small suspected acute LEFT posterior frontal lobe infarct. Moderate chronic small vessel ischemic disease. 2. ASPECTS is 9. Critical Value/emergent results were called by telephone at the time of interpretation on 07/20/2016 at 2:58 am to Dr. Cheral Marker, Neurology, who verbally acknowledged these results. Electronically Signed   By: Elon Alas M.D.   On: 07/20/2016 02:59    Procedures Procedures (including critical care time)  CRITICAL CARE Performed by: Merryl Hacker   Total critical care time: 30 minutes  Critical care time was exclusive of separately billable procedures and treating other patients.  Critical care was necessary to treat or prevent imminent or life-threatening deterioration.  Critical care was time spent personally by me on the following activities: development of treatment plan with patient and/or surrogate as well as nursing, discussions with consultants, evaluation of patient's response to treatment, examination of patient, obtaining history from patient or surrogate, ordering and performing treatments and interventions, ordering and review of laboratory studies, ordering and review of radiographic studies, pulse oximetry and re-evaluation of patient's condition.   Medications Ordered in ED Medications  alteplase (ACTIVASE) 1 mg/mL infusion 57 mg (57 mg Intravenous New Bag/Given 07/20/16 0312)    Followed by  0.9 %  sodium chloride infusion (not administered)  iopamidol (ISOVUE-370) 76 % injection 50 mL (50 mLs Intravenous Contrast Given 07/20/16 0254)  labetalol (NORMODYNE,TRANDATE) injection 5 mg (5 mg Intravenous Given 07/20/16 0310)     Initial Impression / Assessment and Plan / ED Course  I  have reviewed the triage vital signs and the nursing notes.  Pertinent labs & imaging results that were available during my care of the patient were reviewed by me and considered in my medical decision making (see chart for details).  Clinical Course     Patient presents as a code stroke. She has evidence of right-sided deficits and aphasia and neglect. NIH stroke scale of 16. Neurology is at bedside. CT head negative. Patient's husband is at the bedside and consents for TPA. She was given 5 mg of labetalol for blood pressure 540 systolic. She will be given TPA and admitted to the neurology  service.  After history, exam, and medical workup I feel the patient has been appropriately medically screened and is safe for discharge home. Pertinent diagnoses were discussed with the patient. Patient was given return precautions.  I personally performed the services described in this documentation, which was scribed in my presence. The recorded information has been reviewed and is accurate.  Final Clinical Impressions(s) / ED Diagnoses   Final diagnoses:  Stroke [facility])  Stroke [facility])    New Prescriptions New Prescriptions   No medications on file     Merryl Hacker, MD 07/20/16 [phone]

## 2016-07-20 NOTE — Progress Notes (Signed)
OT Cancellation Note  Patient Details Name: Andrea Kennedy MRN: [id number] DOB: 29-Apr-1946   Cancelled Treatment:    Reason Eval/Treat Not Completed: Patient not medically ready (strict bedrest)  [place], OTR/L  [phone] 07/20/2016 07/20/2016, 7:42 AM

## 2016-07-20 NOTE — Evaluation (Signed)
Speech Language Pathology Evaluation Patient Details Name: Andrea Kennedy MRN: [id number] DOB: 01-Dec-1945 Today's Date: 07/20/2016 Time: 0973-5329 SLP Time Calculation (min) (ACUTE ONLY): 15 min  Problem List:  Patient Active Problem List   Diagnosis Date Noted  . Stroke ([place]) 07/20/2016  . Stroke (cerebrum) ([place]) 07/20/2016  . Postoperative anemia due to acute blood loss 06/05/2015  . Hip fracture, right ([place])   . Fracture of hip, right, closed ([place]) 06/03/2015  . Fall 06/03/2015  . Leukocytosis 06/03/2015  . Lupus   . Tobacco abuse   . Sciatica of right side    Past Medical History:  Past Medical History:  Diagnosis Date  . Lupus   . Sciatica of right side   . Tobacco abuse    Past Surgical History:  Past Surgical History:  Procedure Laterality Date  . TOTAL HIP ARTHROPLASTY Right 06/03/2015   Procedure: RIGHT TOTAL HIP ARTHROPLASTY ANTERIOR APPROACH;  Surgeon: Gaynelle Arabian, MD;  Location: [facility] ORS;  Service: Orthopedics;  Laterality: Right;  . TUBAL LIGATION     HPI:  Andrea Kennedy a 70 y.o.femalewho presents to the Emergency Department with right sided weakness, right sided facial droop, right neglect and aphasia. CT minimal propagation of LEFT frontal lobe acute infarct. No hemorrhagic conversion.   Assessment / Plan / Recommendation Clinical Impression  Pt exhibits Broca's aphasia demonstrating primarily intermittent vocalizations/various phonemes, difficulty repeating, anomia, difficulty initating vocalizations. Comprehension for basic information approximately 75% accurate; impairments for more complex information. Good approximation of tune and words during singing. Right inattention present. Pt would benefit from ST to communicative independence.      SLP Assessment  Patient needs continued Speech Lanaguage Pathology Services    Follow Up Recommendations  Inpatient Rehab    Frequency and Duration min 2x/week  2 weeks      SLP Evaluation Cognition  Overall Cognitive Status: Impaired/Different from baseline Arousal/Alertness: Awake/alert Orientation Level: Oriented to person;Oriented to place Attention: Sustained Sustained Attention: Impaired Sustained Attention Impairment: Verbal basic Memory:  (will assess further in therapy) Awareness: Appears intact Problem Solving: Appears intact Behaviors: Lability Safety/Judgment: Appears intact       Comprehension  Auditory Comprehension Overall Auditory Comprehension: Impaired Yes/No Questions: Impaired Basic Immediate Environment Questions: 50-74% accurate (60%) Commands: Within Functional Limits Interfering Components: Attention Visual Recognition/Discrimination Discrimination: Not tested Reading Comprehension Reading Status:  (TBA)    Expression Expression Primary Mode of Expression: Nonverbal - gestures Verbal Expression Overall Verbal Expression: Impaired Initiation: Impaired Automatic Speech:  (good tune and word approximation) Level of Generative/Spontaneous Verbalization:  (sounds and intermittent words) Repetition: Impaired Level of Impairment: Word level Naming: Impairment Responsive: 0-25% accurate Confrontation: Impaired Convergent: Not tested Divergent: Not tested Verbal Errors: Aware of errors (mostly nonverbal, phonemes mostly) Pragmatics: No impairment Interfering Components: Attention Written Expression Dominant Hand: Right   Oral / Motor  Motor Speech Overall Motor Speech: Impaired Respiration: Within functional limits Phonation: Hoarse;Low vocal intensity Articulation: Impaired Level of Impairment: Word Intelligibility: Unable to assess (comment) Motor Planning: Impaired Level of Impairment: Word Motor Speech Errors: Aware   GO                    Houston Siren 07/20/2016, 9:45 AM  Andrea Kennedy Andrea Kennedy.Ed [employer] [phone]

## 2016-07-20 NOTE — Progress Notes (Signed)
Andrea Kennedy is a 71 yo female came into ED via [facility]. LKW 0115 when pt attempted to go to bathroom. Pt presented with Right side flaccid, Right side facial droop, and aphasic. Spouse reports pt was having all over body tremors, incontinent of urine, EMS denies seeing any tremors or shaking. NIHSS 16, CBG 113 TPA started 0312 admitted to neuro

## 2016-07-20 NOTE — Progress Notes (Signed)
PT Cancellation Note  Patient Details Name: Andrea Kennedy MRN: [id number] DOB: May 26, 1946   Cancelled Treatment:    Reason Eval/Treat Not Completed: Patient not medically ready.  Pt currently on strict bedrest.  Please advance activity order once appropriate for PT and mobility.     Thornton Papas [place] 07/20/2016, 8:48 AM

## 2016-07-20 NOTE — Progress Notes (Signed)
EEG Completed; Results Pending  

## 2016-07-20 NOTE — Consult Note (Signed)
Referring Physician: Dr. Dina Rich    Chief Complaint: Acute onset of right hemiplegia and aphasia  HPI: Andrea Kennedy is an 70 y.o. female who presents with acute onset of right hemiplegia and aphasia. LKN 1:15 AM per EMS. Husband states first symptom was jerking of her left side and aphasia, with urinary incontinence. He states she was shaking when EMS arrive, but EMS states no shaking or other seizure like activity seen. No prior history of stroke. NIHSS of 15 after CT head. Not taking a blood thinner at home.   No prior history of stroke.   LSN: 1:15 AM tPA Given: Yes  Past Medical History:  Diagnosis Date  . Lupus   . Sciatica of right side   . Tobacco abuse     Past Surgical History:  Procedure Laterality Date  . TOTAL HIP ARTHROPLASTY Right 06/03/2015   Procedure: RIGHT TOTAL HIP ARTHROPLASTY ANTERIOR APPROACH;  Surgeon: Gaynelle Arabian, MD;  Location: [facility];  Service: Orthopedics;  Laterality: Right;  . TUBAL LIGATION      Family History  Problem Relation Age of Onset  . Breast cancer Mother   . Diabetes Mother   . Heart attack Father   . Fibromyalgia Sister    Social History:  reports that she quit smoking about 12 months ago. She has never used smokeless tobacco. She reports that she does not drink alcohol or use drugs.  Allergies:  Allergies  Allergen Reactions  . Floxin [Ofloxacin] Other (See Comments)    Scared/shaky/pain attacks/confusion    Home Medications:  acetaminophen (TYLENOL) 325 MG tablet Take 650 mg by mouth every 6 (six) hours as needed (for headache.). Historical Provider, MD Needs Review  cyclobenzaprine (FLEXERIL) 5 MG tablet Take 1 tablet (5 mg total) by mouth 3 (three) times daily as needed for muscle spasms. Alexzandrew L Perkins, PA-C Needs Review  diphenhydramine-acetaminophen (TYLENOL PM) 25-500 MG TABS tablet Take 1-2 tablets by mouth at bedtime as needed (for sleep). Historical Provider, MD Needs Review  docusate sodium (COLACE) 100 MG  capsule Take 1 capsule (100 mg total) by mouth 2 (two) times daily. Eugenie Filler, MD Needs Review  enoxaparin (LOVENOX) 40 MG/0.4ML injection Inject 0.4 mLs (40 mg total) into the skin daily. Continue Lovenox injections for eight more days and then discontinue the Lovenox.  Once the patient has completed the blood thinner regimen, then take a Baby 81 mg Aspirin daily for three more weeks. Alexzandrew L Perkins, PA-C Needs Review  meloxicam (MOBIC) 7.5 MG tablet Take 1 tablet (7.5 mg total) by mouth daily. Willeen Niece, MD Needs Review  methocarbamol (ROBAXIN) 500 MG tablet Take 500 mg by mouth 3 (three) times daily. Historical Provider, MD Needs Review  Multiple Vitamins-Minerals (ALIVE WOMENS 50+ PO) Take 1 tablet by mouth daily. Historical Provider, MD Needs Review  nicotine (NICODERM CQ - DOSED IN MG/24 HOURS) 21 mg/24hr patch Place 1 patch (21 mg total) onto the skin daily. Eugenie Filler, MD Needs Review  oxyCODONE (OXY IR/ROXICODONE) 5 MG immediate release tablet Take 1-2 tablets (5-10 mg total) by mouth every 4 (four) hours as needed for moderate pain or severe pain ((for MODERATE breakthrough pain)). Alexzandrew L Perkins, PA-C Needs Review  traMADol (ULTRAM) 50 MG tablet Take 1-2 tablets (50-100 mg total) by mouth every 6 (six) hours as needed (mild pain). Alexzandrew L Perkins, PA-C Needs Review     ROS: Unable to obtain from patient due to aphasia. Husband states no significant recent symptoms.   General  Examination: Weight 63.6 kg (140 lb 3.4 oz), SpO2 94 %.  Anicteric, noncyanotic, NAD, respirations unlabored, extremities warm and well-perfused.   Neurologic Examination: Ment: Dense expressive aphasia. Unable to follow most verbal commands. Good eye contact and attempts to cooperate.  CN: Right visual field cut. Pupils round, left 4 mm, right 3 mm. Able to fixate. Left gaze preference, gaze able to cross volitionally to right. Decreased sensation to noxious right side of  face. Right facial droop. Tongue grossly midline. Head deviated to left.  Motor: RUE 0/5 to noxious with increased tone.  RLE 4/5 LUE 5/5 LLE 5/5 Sensory: Right hemineglect, decreased sensation to RUE and RLE.  Cerebellar: No gross ataxia disproportionate to weakness.  Reflexes: Mild symmetric hyperactivity in upper and lower extremities.  Gait: Unable to assess.   Results for orders placed or performed during the hospital encounter of 07/20/16 (from the past 48 hour(s))  Protime-INR     Status: None   Collection Time: 07/20/16  2:35 AM  Result Value Ref Range   Prothrombin Time 13.7 11.4 - 15.2 seconds   INR 1.05   APTT     Status: None   Collection Time: 07/20/16  2:35 AM  Result Value Ref Range   aPTT 29 24 - 36 seconds  CBC     Status: Abnormal   Collection Time: 07/20/16  2:35 AM  Result Value Ref Range   WBC 7.3 4.0 - 10.5 K/uL   RBC 3.74 (L) 3.87 - 5.11 MIL/uL   Hemoglobin 12.4 12.0 - 15.0 g/dL   HCT 37.9 36.0 - 46.0 %   MCV 101.3 (H) 78.0 - 100.0 fL   MCH 33.2 26.0 - 34.0 pg   MCHC 32.7 30.0 - 36.0 g/dL   RDW 15.1 11.5 - 15.5 %   Platelets 270 150 - 400 K/uL  Differential     Status: None   Collection Time: 07/20/16  2:35 AM  Result Value Ref Range   Neutrophils Relative % 65 %   Neutro Abs 4.8 1.7 - 7.7 K/uL   Lymphocytes Relative 24 %   Lymphs Abs 1.7 0.7 - 4.0 K/uL   Monocytes Relative 7 %   Monocytes Absolute 0.5 0.1 - 1.0 K/uL   Eosinophils Relative 3 %   Eosinophils Absolute 0.2 0.0 - 0.7 K/uL   Basophils Relative 1 %   Basophils Absolute 0.0 0.0 - 0.1 K/uL  Comprehensive metabolic panel     Status: Abnormal   Collection Time: 07/20/16  2:35 AM  Result Value Ref Range   Sodium 138 135 - 145 mmol/L   Potassium 3.8 3.5 - 5.1 mmol/L   Chloride 106 101 - 111 mmol/L   CO2 23 22 - 32 mmol/L   Glucose, Bld 113 (H) 65 - 99 mg/dL   BUN 18 6 - 20 mg/dL   Creatinine, Ser 1.10 (H) 0.44 - 1.00 mg/dL   Calcium 9.5 8.9 - 10.3 mg/dL   Total Protein 6.6 6.5 - 8.1  g/dL   Albumin 4.0 3.5 - 5.0 g/dL   AST 24 15 - 41 U/L   ALT 12 (L) 14 - 54 U/L   Alkaline Phosphatase 48 38 - 126 U/L   Total Bilirubin 0.8 0.3 - 1.2 mg/dL   GFR calc non Af Amer 50 (L) >60 mL/min   GFR calc Af Amer 58 (L) >60 mL/min    Comment: (NOTE) The eGFR has been calculated using the CKD EPI equation. This calculation has not been validated in all clinical  situations. eGFR's persistently <60 mL/min signify possible Chronic Kidney Disease.    Anion gap 9 5 - 15  CBG monitoring, ED     Status: Abnormal   Collection Time: 07/20/16  2:35 AM  Result Value Ref Range   Glucose-Capillary 107 (H) 65 - 99 mg/dL  Ethanol     Status: None   Collection Time: 07/20/16  2:36 AM  Result Value Ref Range   Alcohol, Ethyl (B) <5 <5 mg/dL    Comment:        LOWEST DETECTABLE LIMIT FOR SERUM ALCOHOL IS 5 mg/dL FOR MEDICAL PURPOSES ONLY   I-stat troponin, ED (not at [facility], [facility])     Status: None   Collection Time: 07/20/16  2:39 AM  Result Value Ref Range   Troponin i, poc 0.01 0.00 - 0.08 ng/mL   Comment 3            Comment: Due to the release kinetics of cTnI, a negative result within the first hours of the onset of symptoms does not rule out myocardial infarction with certainty. If myocardial infarction is still suspected, repeat the test at appropriate intervals.   I-Stat Chem 8, ED  (not at [facility], [facility])     Status: Abnormal   Collection Time: 07/20/16  2:41 AM  Result Value Ref Range   Sodium 140 135 - 145 mmol/L   Potassium 3.9 3.5 - 5.1 mmol/L   Chloride 105 101 - 111 mmol/L   BUN 23 (H) 6 - 20 mg/dL   Creatinine, Ser 1.00 0.44 - 1.00 mg/dL   Glucose, Bld 112 (H) 65 - 99 mg/dL   Calcium, Ion 1.17 1.15 - 1.40 mmol/L   TCO2 25 0 - 100 mmol/L   Hemoglobin 12.9 12.0 - 15.0 g/dL   HCT 38.0 36.0 - 46.0 %   Ct Head Code Stroke Wo Contrast`  Result Date: 07/20/2016 CLINICAL DATA:  Code stroke. Last seen normal at 1 15 a.m.: RIGHT-sided weakness, RIGHT facial droop and aphasia.  EXAM: CT HEAD WITHOUT CONTRAST TECHNIQUE: Contiguous axial images were obtained from the base of the skull through the vertex without intravenous contrast. COMPARISON:  None. FINDINGS: BRAIN: The ventricles and sulci are normal for age. No intraparenchymal hemorrhage, mass effect nor midline shift. Patchy supratentorial white matter hypodensities within normal range for patient's age, though non-specific are most compatible with chronic small vessel ischemic disease. Small area of focal blurring LEFT posterior frontal lobe/ precentral gyrus. No abnormal extra-axial fluid collections. Basal cisterns are patent. VASCULAR: Moderate calcific atherosclerosis of the carotid siphons. SKULL: No skull fracture. No significant scalp soft tissue swelling. SINUSES/ORBITS: The mastoid air-cells and included paranasal sinuses are well-aerated.The included ocular globes and orbital contents are non-suspicious. OTHER: None. ASPECTS [facility] Stroke Program Early CT Score) - Ganglionic level infarction (caudate, lentiform nuclei, internal capsule, insula, M1-M3 cortex): 7 - Supraganglionic infarction (M4-M6 cortex): 6 Total score (0-10 with 10 being normal): 9 IMPRESSION: 1. Small suspected acute LEFT posterior frontal lobe infarct. Moderate chronic small vessel ischemic disease. 2. ASPECTS is 9. Critical Value/emergent results were called by telephone at the time of interpretation on 07/20/2016 at 2:58 am to Dr. Cheral Marker, Neurology, who verbally acknowledged these results. Electronically Signed   By: Elon Alas M.D.   On: 07/20/2016 02:59    Assessment: 70 y.o. female with acute onset of right hemiplegia and aphasia.  1. Exam findings best localize as a large left MCA territory lesion, most likely acute ischemic stroke. CT head without hemorrhage; small  left posterior frontal lobe hypodensity noted, in addition to moderate chronic small vessel ischemic changes.  2. CTA reveals no LVO.  3. History of lupus. In addition to  cardioembolic stroke and artery to artery embolism, DDx for underlying etiology of her stroke includes CNS lupus vasculitis.   Plan: 1. The patient is an IV tPA candidate. Exclusion criteria reviewed with husband, with no absolute contraindications. Although questionable seizure activity at onset, overall clinical picture more consistent with stroke than seizure with Todd's paralysis. Overall benefits of IV tPA administration outweigh overall risks. Discussed risks/benefits with husband and my recommendation to administer tPA. The patient's husband expressed understanding and agreement with the plan.   2. Admit to MICU for post-tPA protocol, including frequent neuro checks and BP management.  3. DVT prophylaxis with SCDs. No anticoagluants or antiplatelet agents for at least 24 hours following tPA.  4. Repeat CT head at 4 AM on 12/7. If no hemorrhagic conversion, can start an antiplatelet agent.  5. Carotid dopplers 6. Echocardiogram 7. MRI, MRA  of the brain without contrast 8. Telemetry monitoring 9. PT consult, OT consult, Speech consult 10. Risk factor modification  11. Fasting lipids, hemoglobin A1c.    '@Electronically'  signed: Dr. Kerney Elbe   '@strokeconsult' @ 07/20/2016, 3:17 AM

## 2016-07-20 NOTE — Procedures (Signed)
ELECTROENCEPHALOGRAM REPORT  Date of Study: 07/20/2016  Patient's Name: Andrea Kennedy MRN: [id number] Date of Birth: [birth year]  Referring Provider: Dr. Kerney Elbe   Clinical History: This is a 70 year old woman with right-sided weakness and aphasia, which started with jerking on the left side, urinary incontinence.  Medications: acetaminophen (TYLENOL) tablet 650 mg  cyclobenzaprine (FLEXERIL) tablet 5 mg  docusate sodium (COLACE) capsule 100 mg  nicardipine (CARDENE) '20mg'$  in 0.86% saline 256m IV infusion (0.1 mg/ml)  nicotine (NICODERM CQ - dosed in mg/24 hours) patch 21 mg  oxyCODONE (Oxy IR/ROXICODONE) immediate release tablet 5-10 mg  pantoprazole (PROTONIX) injection 40 mg  senna-docusate (Senokot-S) tablet 1 tablet  traMADol (ULTRAM) tablet 50-100 mg  zolpidem (AMBIEN) tablet 5 mg   Technical Summary: A multichannel digital EEG recording measured by the international 10-20 system with electrodes applied with paste and impedances below 5000 ohms performed in our laboratory with EKG monitoring in an awake and asleep patient.  Hyperventilation and photic stimulation were not performed.  The digital EEG was referentially recorded, reformatted, and digitally filtered in a variety of bipolar and referential montages for optimal display.    Description: The patient is awake and asleep during the recording.  During maximal wakefulness, there is a symmetric, medium voltage 8.5 Hz posterior dominant rhythm that attenuates with eye opening.  There is focal polymorphic 4-5 Hz theta slowing over the left hemisphere, at times sharply contoured without clear epileptogenic potential.  During drowsiness and sleep, there is an increase in theta slowing of the background with poorly formed vertex waves and sleep spindles seen.  Hyperventilation and photic stimulation were not performed.  There were no clear epileptiform discharges or electrographic seizures seen.    EKG lead was  unremarkable.  Impression: This awake and asleep EEG is abnormal due to focal slowing over the left hemisphere.  Clinical Correlation of the above findings indicates focal cerebral dysfunction over the left hemisphere suggestive of underlying structural or physiologic abnormality. The absence of epileptiform discharges does not exclude a clinical diagnosis of epilepsy.  Clinical correlation is advised.   KEllouise Newer M.D.

## 2016-07-20 NOTE — Progress Notes (Signed)
STROKE TEAM PROGRESS NOTE   HISTORY OF PRESENT ILLNESS (per record) Andrea Kennedy an 70 y.o.femalewho presents with acute onset of right hemiplegia and aphasia. LKN 1:15 AM on 07/20/2016 per EMS. Husband states first symptom was jerking of her left side and aphasia, with urinary incontinence. He states she was shaking when EMS arrive, but EMS states no shaking or other seizure like activity seen. No prior history of stroke. NIHSS of 15 after CT head. Not taking a blood thinner at home. No prior history of stroke. Patient was administered IV t-PA 07/20/2016 at 312a. She was admitted to the neuro ICU for further evaluation and treatment.   SUBJECTIVE (INTERVAL HISTORY) Her RN is at the bedside. No family is at bedside. As per nurse, neuro status stable without significant change. No seizure activities. EEG and MRI pending. Still has right facial droop and right hemiparesis.   OBJECTIVE Temp:  [98 F (36.7 C)-98.5 F (36.9 C)] 98 F (36.7 C) (12/06 0800) Pulse Rate:  [61-78] 66 (12/06 0930) Cardiac Rhythm: Normal sinus rhythm (12/06 0745) Resp:  [14-28] 14 (12/06 0930) BP: (107-186)/(24-123) 150/72 (12/06 0930) SpO2:  [93 %-100 %] 94 % (12/06 0930) Weight:  [136 lb 14.5 oz (62.1 kg)-140 lb 3.4 oz (63.6 kg)] 136 lb 14.5 oz (62.1 kg) (12/06 0400)  CBC:  Recent Labs Lab 07/20/16 0235 07/20/16 0241  WBC 7.3  --   NEUTROABS 4.8  --   HGB 12.4 12.9  HCT 37.9 38.0  MCV 101.3*  --   PLT 270  --     Basic Metabolic Panel:  Recent Labs Lab 07/20/16 0235 07/20/16 0241  NA 138 140  K 3.8 3.9  CL 106 105  CO2 23  --   GLUCOSE 113* 112*  BUN 18 23*  CREATININE 1.10* 1.00  CALCIUM 9.5  --     Lipid Panel:    Component Value Date/Time   CHOL 181 07/20/2016 0440   TRIG 85 07/20/2016 0440   HDL 62 07/20/2016 0440   CHOLHDL 2.9 07/20/2016 0440   VLDL 17 07/20/2016 0440   LDLCALC 102 (H) 07/20/2016 0440   HgbA1c: No results found for: HGBA1C Urine Drug Screen: No results  found for: LABOPIA, COCAINSCRNUR, LABBENZ, AMPHETMU, THCU, LABBARB    IMAGING I have personally reviewed the radiological images below and agree with the radiology interpretations.  Ct Head Code Stroke Wo Contrast` 07/20/2016 1. Small suspected acute LEFT posterior frontal lobe infarct. Moderate chronic small vessel ischemic disease. 2. ASPECTS is 9.   Ct Head Wo Contrast 07/20/2016 Minimal propagation of LEFT frontal lobe acute infarct. No hemorrhagic conversion. Otherwise stable examination including moderate chronic small vessel ischemic disease.  CTA NECK 07/20/2016 Moderate stenosis LEFT vertebral artery origin without hemodynamically significant stenosis by NASCET criteria.   CTA HEAD 07/20/2016 No emergent large vessel occlusion or high-grade stenosis. Intracranial atherosclerosis, most apparent within the posterior circulation resulting in moderate luminal irregularity.  MRI pending  EEG pending  TTE pending   PHYSICAL EXAM  Temp:  [98 F (36.7 C)-98.5 F (36.9 C)] 98 F (36.7 C) (12/06 0800) Pulse Rate:  [61-78] 66 (12/06 0930) Resp:  [14-28] 14 (12/06 0930) BP: (107-186)/(24-123) 150/72 (12/06 0930) SpO2:  [93 %-100 %] 94 % (12/06 0930) Weight:  [136 lb 14.5 oz (62.1 kg)-140 lb 3.4 oz (63.6 kg)] 136 lb 14.5 oz (62.1 kg) (12/06 0400)  General - Well nourished, well developed, in no apparent distress.  Ophthalmologic - Fundi not visualized due to noncooperation.  Cardiovascular -  Regular rate and rhythm.  Mental Status -  Level of arousal and orientation to self were intact, however not able to answer other orientation questions. Language exam showed significant dysarthria, able to follow simple commands, paucity of speech but able to repeat one simple sentence also dysarthric, but not able to name. No effective communication. Right neglect.   Cranial Nerves II - XII - II - did not blinking to visual threat but able to track objects. III, IV, VI -  Extraocular movements intact, however with left  Gaze preference but able to move to left gaze. V - Facial sensation intact. VII - right facial droop VIII - Hearing & vestibular intact bilaterally. X - Palate elevates symmetrically, severe dysarthria. XI - Chin turning & shoulder shrug intact bilaterally. XII - Tongue protrusion to the right.  Motor Strength - The patient's strength was 4/5 LUE and LLE, RUE 0/5 and RLE 2+/5 on pain stimulation.  Bulk was normal and fasciculations were absent.   Motor Tone - Muscle tone was assessed at the neck and appendages and was increased on the RUE and RLE.  Reflexes - The patient's reflexes were 1+ in all extremities and she had no pathological reflexes.  Sensory - Light touch, temperature/pinprick were assessed and were symmetrical.    Coordination - not cooperative on exam.  Tremor was absent.  Gait and Station - not tested.   ASSESSMENT/PLAN [name] is a 70 y.o. female with history of lupus and tobacco abuse presenting with R hemiplegia and aphasia. She received IV t-PA 07/20/2016 at 0312.   Stroke:  Dominant left frontal infarct s/p tPA, secondary to unknown source. Need further work up due to [place] of lupus  Resultant  R hemiparesis, aphasia, dysarthria  CTA head  No significant stenosis  CTA neck  Mod L VA stenosis, b/l siphon nonflow limiting athero  S/p tPA with neuro worsening - CT head L frontal infarct, no hemorrhage  MRI  pending  2D Echo  pending    EEG pending  LDL 102  HgbA1c pending  SCDsfor VTE prophylaxis  Diet NPO time specified  aspirin 81 mg daily prior to admission, now on No antithrombotic as within 24h post TPA. For now, plan aspirin if imaging at 24h neg for hemorrhage.  Ongoing aggressive stroke risk factor management  Therapy recommendations:  pending    Disposition:  pending   Blood Pressure  No hx Hypertension  Stable 140s now  Permissive hypertension (OK if < 180/105) but  gradually normalize in 5-7 days  Long-term BP goal normotensive  Possible seizure  Inconsistent story from family vs. EMS  No further acitivity  EEG pending  ? Lupus  Listed as PMH  Will need further clarification from family  Will order autoimmune work up, lupus anticoagulant, may consider TEE to rule out marantic endocarditis, or cerebral angio to rule out CNS vasculitis.   Will check LE venous doppler to rule out DVT.   Hyperlipidemia  Home meds:  No statin  LDL 102, goal < 70  Add statin once able to swallow  Continue statin at discharge  Dysphagia  Speech following  MBS today  NPO for now  IVF  Tobacco abuse  Current smoker  Smoking cessation counseling will be provided  Nicotine patch   Other Stroke Risk Factors  Advanced age  Cigarette smoker, advised to stop smoking  Other Active Problems  R sciatica  Hospital day # 0  This patient is critically ill due to left brain  stroke s/p tPA, aphasia, ? lupus and at significant risk of neurological worsening, death form recurrent stroke, hemorrhagic transformation, heart failure, brain herniation. This patient's care requires constant monitoring of vital signs, hemodynamics, respiratory and cardiac monitoring, review of multiple databases, neurological assessment, discussion with family, other specialists and medical decision making of high complexity. I spent 40 minutes of neurocritical care time in the care of this patient.  Rosalin Hawking, MD PhD Stroke Neurology 07/20/2016 2:17 PM    To contact Stroke Continuity provider, please refer to [email or web address] After hours, contact General Neurology

## 2016-07-20 NOTE — Progress Notes (Signed)
Responded to consult to create advanced directive, but pt's nurse advised check back later, since pt can now respond to simple Q's but not necessarily understand abstract concepts. Pt's daughter reportedly here but did not find her yet. Chaplain available for follow-up.    07/20/16 1000  Clinical Encounter Type  Visited With Patient  Visit Type Initial;Other (Comment) (consult for advanced directive)  Referral From Nurse  Stress Factors  Patient Stress Factors Health changes;Loss of control   Gerrit Heck, Chaplain

## 2016-07-20 NOTE — Progress Notes (Signed)
Patient had a neuro change of unequal pupils and not able to lift right leg when previously was able to. Neurology doctor notified. STAT head CT verbal order put in. Took patient to STAT CT. Will continue to monitor.

## 2016-07-20 NOTE — ED Provider Notes (Signed)
MSE was initiated and I personally evaluated the patient and placed orders (if any) at  2:36 AM on July 20, 2016.  The patient appears stable so that the remainder of the MSE may be completed by another provider.  Patient arrived from via EMS as a code stroke. Last seen normal at 1:15. Presented with right-sided weakness, right facial droop, aphasia. Airway is stable and she is taken to CT scan.   Delora Fuel, MD [id number]

## 2016-07-20 NOTE — Progress Notes (Signed)
Modified Barium Swallow Progress Note  Patient Details  Name: Aemilia Dedrick MRN: [id number] Date of Birth: 1946-03-15  Today's Date: 07/20/2016  Modified Barium Swallow completed.  Full report located under Chart Review in the Imaging Section.  Brief recommendations include the following:  Clinical Impression  Mild oropharyngeal dysphagia marked by decreased bolus cohesion resulting in premature spill to valleculae with thin x 1 and once with oral residue to pyriform sinuses. Trace and flash penetration x 1 with straw. Recommend Dys 3 texture, thin liquids, small straw sips allowed, pills whole in applesauce.     Swallow Evaluation Recommendations       SLP Diet Recommendations: Dysphagia 3 (Mech soft) solids;Thin liquid   Liquid Administration via: Cup;Straw   Medication Administration: Whole meds with puree   Supervision: Patient able to self feed;Intermittent supervision to cue for compensatory strategies   Compensations: Slow rate;Small sips/bites   Postural Changes: Seated upright at 90 degrees   Oral Care Recommendations: Oral care BID        Houston Siren 07/20/2016,3:09 PM   Orbie Pyo Colvin Caroli.Ed [employer] [phone]

## 2016-07-20 NOTE — ED Triage Notes (Signed)
Pt from home by [facility] EMS sudden onset of right sided facial droop and aphasia, and pt has sensation but cannot move the right side

## 2016-07-20 NOTE — Progress Notes (Signed)
  Echocardiogram 2D Echocardiogram has been performed.  Nicholis Stepanek L Androw 07/20/2016, 3:59 PM

## 2016-07-21 ENCOUNTER — Inpatient Hospital Stay (HOSPITAL_COMMUNITY): Payer: Medicare HMO

## 2016-07-21 ENCOUNTER — Encounter (HOSPITAL_COMMUNITY): Payer: Self-pay | Admitting: *Deleted

## 2016-07-21 DIAGNOSIS — I63419 Cerebral infarction due to embolism of unspecified middle cerebral artery: Secondary | ICD-10-CM

## 2016-07-21 DIAGNOSIS — I639 Cerebral infarction, unspecified: Secondary | ICD-10-CM

## 2016-07-21 DIAGNOSIS — G8191 Hemiplegia, unspecified affecting right dominant side: Secondary | ICD-10-CM

## 2016-07-21 LAB — BASIC METABOLIC PANEL
Anion gap: 10 (ref 5–15)
BUN: 6 mg/dL (ref 6–20)
CALCIUM: 9.2 mg/dL (ref 8.9–10.3)
CO2: 24 mmol/L (ref 22–32)
CREATININE: 0.85 mg/dL (ref 0.44–1.00)
Chloride: 105 mmol/L (ref 101–111)
GFR calc Af Amer: >60 mL/min (ref >60)
Glucose, Bld: 105 mg/dL — ABNORMAL HIGH (ref 65–99)
Potassium: 3.8 mmol/L (ref 3.5–5.1)
SODIUM: 139 mmol/L (ref 135–145)

## 2016-07-21 LAB — CBC
HCT: 36.4 % (ref 36.0–46.0)
Hemoglobin: 11.9 g/dL — ABNORMAL LOW (ref 12.0–15.0)
MCH: 32.7 pg (ref 26.0–34.0)
MCHC: 32.7 g/dL (ref 30.0–36.0)
MCV: 100 fL (ref 78.0–100.0)
PLATELETS: 278 10*3/uL (ref 150–400)
RBC: 3.64 MIL/uL — ABNORMAL LOW (ref 3.87–5.11)
RDW: 14.7 % (ref 11.5–15.5)
WBC: 6 10*3/uL (ref 4.0–10.5)

## 2016-07-21 LAB — TSH: TSH: 2.223 u[IU]/mL (ref 0.350–4.500)

## 2016-07-21 LAB — VITAMIN B12: Vitamin B-12: 236 pg/mL (ref 180–914)

## 2016-07-21 LAB — HEMOGLOBIN A1C
HEMOGLOBIN A1C: 5.2 % (ref 4.8–5.6)
MEAN PLASMA GLUCOSE: 103 mg/dL

## 2016-07-21 LAB — HIV ANTIBODY (ROUTINE TESTING W REFLEX): HIV Screen 4th Generation wRfx: NONREACTIVE

## 2016-07-21 LAB — RPR: RPR Ser Ql: NONREACTIVE

## 2016-07-21 MED ORDER — LISINOPRIL 20 MG PO TABS
20.0000 mg | ORAL_TABLET | Freq: Every day | ORAL | Status: DC
  Administered 2016-07-21 – 2016-07-25 (×4): 20 mg via ORAL
  Filled 2016-07-21 (×5): qty 1

## 2016-07-21 MED ORDER — ENOXAPARIN SODIUM 40 MG/0.4ML ~~LOC~~ SOLN
40.0000 mg | SUBCUTANEOUS | Status: DC
  Administered 2016-07-21 – 2016-07-22 (×2): 40 mg via SUBCUTANEOUS
  Filled 2016-07-21 (×2): qty 0.4

## 2016-07-21 MED ORDER — ASPIRIN EC 325 MG PO TBEC
325.0000 mg | DELAYED_RELEASE_TABLET | Freq: Every day | ORAL | Status: DC
  Administered 2016-07-21 – 2016-07-22 (×2): 325 mg via ORAL
  Filled 2016-07-21 (×2): qty 1

## 2016-07-21 MED ORDER — ASPIRIN 300 MG RE SUPP
300.0000 mg | Freq: Every day | RECTAL | Status: DC
  Filled 2016-07-21: qty 1

## 2016-07-21 MED ORDER — ATORVASTATIN CALCIUM 10 MG PO TABS
20.0000 mg | ORAL_TABLET | Freq: Every day | ORAL | Status: DC
  Administered 2016-07-22 – 2016-07-24 (×3): 20 mg via ORAL
  Filled 2016-07-21 (×3): qty 2

## 2016-07-21 MED ORDER — DILTIAZEM HCL 25 MG/5ML IV SOLN
5.0000 mg | Freq: Once | INTRAVENOUS | Status: DC
  Filled 2016-07-21: qty 5

## 2016-07-21 MED ORDER — HYDRALAZINE HCL 20 MG/ML IJ SOLN
2.0000 mg | Freq: Once | INTRAMUSCULAR | Status: DC
  Filled 2016-07-21: qty 1

## 2016-07-21 MED ORDER — AMLODIPINE BESYLATE 5 MG PO TABS: 5.0000 mg | ORAL_TABLET | Freq: Every day | ORAL | Status: DC

## 2016-07-21 MED ORDER — PANTOPRAZOLE SODIUM 40 MG PO TBEC
40.0000 mg | DELAYED_RELEASE_TABLET | Freq: Every day | ORAL | Status: DC
  Administered 2016-07-21 – 2016-07-25 (×5): 40 mg via ORAL
  Filled 2016-07-21 (×5): qty 1

## 2016-07-21 NOTE — Care Management Note (Signed)
Case Management Note  Patient Details  Name: Linzy Laury MRN: [id number] Date of Birth: [birth year]  Subjective/Objective:  Pt admitted on 07/20/16 s/p stroke with TPA given.  PTA, pt resided home and was independent.  She states she cares for her husband and mother in law at home.                      Action/Plan: PT/OT recommending CIR; awaiting rehab MD to see.  Will follow for discharge planning as pt progresses.     Expected Discharge Date:                  Expected Discharge Plan:  [place]  In-House Referral:     Discharge planning Services  CM Consult  Post Acute Care Choice:    Choice offered to:     DME Arranged:    DME Agency:     HH Arranged:    [place] Agency:     Status of Service:  In process, will continue to follow  If discussed at Long Length of Stay Meetings, dates discussed:    Additional Comments:  Reinaldo Raddle, RN, BSN  Trauma/Neuro ICU Case Manager [phone]

## 2016-07-21 NOTE — Progress Notes (Signed)
Speech Language Pathology Treatment: Dysphagia;Cognitive-Linquistic  Patient Details Name: Andrea Kennedy MRN: [id number] DOB: [birth year] Today's Date: 07/21/2016 Time: 5436-0677 SLP Time Calculation (min) (ACUTE ONLY): 17 min  Assessment / Plan / Recommendation Clinical Impression  Pt's expressive language has significantly improved from yesterday's eval. Intermittent mild anomia but primarily intelligibility decreased by severe dysarthria marked by distorted phonemes and imprecise articulation. Introduced pt to speech strategies with demonstration. Mild po residue in right buccal cavity with verbal/visual cues to remove with toothette. No s/s aspiration with straw sips liquid. Recommend continue Dys 3, thin liquids and check for pocketed food. ST will revise pt's goals.   HPI HPI: Andrea Kennedy a 70 y.o.femalewho presents to the Emergency Department with right sided weakness, right sided facial droop, right neglect and aphasia. CT minimal propagation of LEFT frontal lobe acute infarct. No hemorrhagic conversion.      SLP Plan  Continue with current plan of care;Goals updated     Recommendations  Diet recommendations: Dysphagia 2 (fine chop);Thin liquid Liquids provided via: Cup;Straw Medication Administration: Whole meds with puree Supervision: Patient able to self feed;Intermittent supervision to cue for compensatory strategies Compensations: Slow rate;Small sips/bites Postural Changes and/or Swallow Maneuvers: Seated upright 90 degrees                General recommendations: Rehab consult Oral Care Recommendations: Oral care BID Follow up Recommendations: Inpatient Rehab Plan: Continue with current plan of care;Goals updated       GO                Andrea Kennedy 07/21/2016, 3:19 PM   Andrea Kennedy M.Ed [employer] [phone]

## 2016-07-21 NOTE — Consult Note (Signed)
Physical Medicine and Rehabilitation Consult Reason for Consult: Acute moderate nonhemorrhagic left frontal lobe infarct Referring Physician: Dr.Xu   HPI: Andrea Kennedy is a 70 y.o. right handed female with history of lupus, tobacco abuse, right total hip arthroplasty 06/03/2015. Per chart review patient lives with spouse independent prior to admission. One level home with 3 steps to entry. Presented 07/20/2016 with right-sided weakness and aphasia. CT/MRI of the head showed acute moderate nonhemorrhagic left frontal lobe infarct. Patient did receive TPA.. CTA of head and neck with no emergent large vessel occlusion or stenosis. EEG showed focal cerebral dysfunction over the left hemisphere suggestive of underlying structural or physiologic abnormality. No seizure activity noted. Venous Dopplers lower extremities negative for DVT. Neurology consulted maintain on aspirin 325 mg daily for CVA prophylaxis. Subcutaneous Lovenox for DVT prophylaxis. Tolerating a mechanical soft diet. Patient with ongoing bouts of hypertension received intravenous hydralazine. Physical therapy evaluation completed with recommendations of physical medicine rehabilitation consult.  Patient has severe expressive aphasia as well as dysarthria.  Review of Systems  Unable to perform ROS: Language   Past Medical History:  Diagnosis Date  . Lupus   . Sciatica of right side   . Tobacco abuse    Past Surgical History:  Procedure Laterality Date  . TOTAL HIP ARTHROPLASTY Right 06/03/2015   Procedure: RIGHT TOTAL HIP ARTHROPLASTY ANTERIOR APPROACH;  Surgeon: Gaynelle Arabian, MD;  Location: [facility];  Service: Orthopedics;  Laterality: Right;  . TUBAL LIGATION     Family History  Problem Relation Age of Onset  . Breast cancer Mother   . Diabetes Mother   . Heart attack Father   . Fibromyalgia Sister    Social History:  reports that she quit smoking about 12 months ago. She has never used smokeless tobacco. She  reports that she does not drink alcohol or use drugs. Allergies:  Allergies  Allergen Reactions  . Floxin [Ofloxacin] Other (See Comments)    Scared/shaky/pain attacks/confusion   Medications Prior to Admission  Medication Sig Dispense Refill  . cyclobenzaprine (FLEXERIL) 5 MG tablet Take 1 tablet (5 mg total) by mouth 3 (three) times daily as needed for muscle spasms. 90 tablet 0  . acetaminophen (TYLENOL) 325 MG tablet Take 650 mg by mouth every 6 (six) hours as needed (for headache.).    Marland Kitchen diphenhydramine-acetaminophen (TYLENOL PM) 25-500 MG TABS tablet Take 1-2 tablets by mouth at bedtime as needed (for sleep).    . docusate sodium (COLACE) 100 MG capsule Take 1 capsule (100 mg total) by mouth 2 (two) times daily. (Patient not taking: Reported on 07/20/2016) 10 capsule 0  . enoxaparin (LOVENOX) 40 MG/0.4ML injection Inject 0.4 mLs (40 mg total) into the skin daily. Continue Lovenox injections for eight more days and then discontinue the Lovenox. Once the patient has completed the blood thinner regimen, then take a Baby 81 mg Aspirin daily for three more weeks. (Patient not taking: Reported on 07/20/2016) 8 Syringe 0  . meloxicam (MOBIC) 7.5 MG tablet Take 1 tablet (7.5 mg total) by mouth daily. (Patient not taking: Reported on 07/20/2016) 30 tablet 0  . Multiple Vitamins-Minerals (ALIVE WOMENS 50+ PO) Take 1 tablet by mouth daily.    . nicotine (NICODERM CQ - DOSED IN MG/24 HOURS) 21 mg/24hr patch Place 1 patch (21 mg total) onto the skin daily. (Patient not taking: Reported on 07/20/2016) 28 patch 0  . oxyCODONE (OXY IR/ROXICODONE) 5 MG immediate release tablet Take 1-2 tablets (5-10 mg total) by  mouth every 4 (four) hours as needed for moderate pain or severe pain ((for MODERATE breakthrough pain)). (Patient not taking: Reported on 07/20/2016) 90 tablet 0  . traMADol (ULTRAM) 50 MG tablet Take 1-2 tablets (50-100 mg total) by mouth every 6 (six) hours as needed (mild pain). 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  Lives With: Spouse  Functional History: Prior Function Level of Independence: Independent Comments: Drives, cooks, cleans, caregiver Functional Status:  Mobility: Bed Mobility Overal bed mobility: Needs Assistance Bed Mobility: Supine to Sit Supine to sit: Supervision, HOB elevated General bed mobility comments: Able to get to EOB without assist, increased time. Not using RUE. Transfers Overall transfer level: Needs assistance Equipment used: None Transfers: Sit to/from Stand, Stand Pivot Transfers Sit to Stand: Min assist Stand pivot transfers: Min assist General transfer comment: Assist to power to standing and for SPT x2 from bed to [facility] and from [facility] to chair. RIght knee instability noted but no buckling. Ambulation/Gait Ambulation/Gait assistance: Min assist Ambulation Distance (Feet): 4 Feet Assistive device: 1 person hand held assist Gait Pattern/deviations: Step-to pattern, Decreased stride length General Gait Details: Guarded gait- Able to take a few steps in room but reports dizziness so needed to sit. Right knee instability but no buckling. Gait velocity: decreased Gait velocity interpretation: Below normal speed for age/gender    ADL:    Cognition: Cognition Overall Cognitive Status: Difficult to assess Arousal/Alertness: Awake/alert Orientation Level: Oriented X4 Attention: Sustained Sustained Attention: Impaired Sustained Attention Impairment: Verbal  basic Memory:  (will assess further in therapy) Awareness: Appears intact Problem Solving: Appears intact Behaviors: Lability Safety/Judgment: Appears intact Cognition Arousal/Alertness: Awake/alert Behavior During Therapy: WFL for tasks assessed/performed Overall Cognitive Status: Difficult to assess Area of Impairment: Following commands Following Commands: Follows multi-step commands consistently General Comments: A&O x4; able to follow simple 1-2 step commands. Some words intelligible, others not. Some right inattention noted.  Difficult to assess due to: Impaired communication  Blood pressure (!) 175/65, pulse (!) 55, temperature 97.4 F (36.3 C), temperature source Oral, resp. rate 14, height '5\' 6"'$  (1.676 m), weight 62.1 kg (136 lb 14.5 oz), SpO2 94 %. Physical Exam  Constitutional: She appears well-developed.  HENT:  Head: Normocephalic.  Eyes: EOM are normal.  Neck: Normal range of motion. Neck supple. No thyromegaly present.  Cardiovascular: Normal rate and regular rhythm.   Respiratory: Effort normal and breath sounds normal. No respiratory distress.  GI: Soft. Bowel sounds are normal. She exhibits no distension. There is no tenderness.  Neurological: She is alert.  Patient aphasic appears to be primarily expressive. She did follow simple commands.  Skin: Skin is warm and dry.  Is able to answer yes no questions. She is able to follow simple commands. She attempts speaking in longer sentences. This is intelligible Motor strength is 3 minus in the right deltoid, biceps, triceps, grip, 4 minus in the right hip flexor, knee extensor, 3 at the ankle dorsiflexor, plantar flexor. Right hip range of motion is good internal and external rotation is normal. Left-sided strength is 5/5 in the upper and lower limb  Sensation cannot assess secondary to communication issues Results for orders placed or performed during the hospital encounter of 07/20/16 (from the past 24 hour(s))  Urine  rapid drug screen (hosp performed)not at [facility]     Status: Abnormal   Collection Time: 07/20/16  3:03 PM  Result Value Ref Range   Opiates NONE DETECTED NONE DETECTED   Cocaine POSITIVE (A) NONE DETECTED   Benzodiazepines NONE DETECTED NONE DETECTED   Amphetamines NONE DETECTED NONE DETECTED   Tetrahydrocannabinol NONE DETECTED NONE DETECTED   Barbiturates NONE DETECTED NONE DETECTED  Urinalysis, Routine w reflex microscopic     Status: Abnormal   Collection Time: 07/20/16  3:03 PM  Result Value Ref Range   Color, Urine STRAW (A) YELLOW   APPearance CLEAR CLEAR   Specific Gravity, Urine 1.012 1.005 - 1.030   pH 7.0 5.0 - 8.0   Glucose, UA NEGATIVE NEGATIVE mg/dL   Hgb urine dipstick NEGATIVE NEGATIVE   Bilirubin Urine NEGATIVE NEGATIVE   Ketones, ur NEGATIVE NEGATIVE mg/dL   Protein, ur NEGATIVE NEGATIVE mg/dL   Nitrite NEGATIVE NEGATIVE   Leukocytes, UA NEGATIVE NEGATIVE  CBC     Status: Abnormal   Collection Time: 07/21/16  8:17 AM  Result Value Ref Range   WBC 6.0 4.0 - 10.5 K/uL   RBC 3.64 (L) 3.87 - 5.11 MIL/uL   Hemoglobin 11.9 (L) 12.0 - 15.0 g/dL   HCT 36.4 36.0 - 46.0 %   MCV 100.0 78.0 - 100.0 fL   MCH 32.7 26.0 - 34.0 pg   MCHC 32.7 30.0 - 36.0 g/dL   RDW 14.7 11.5 - 15.5 %   Platelets 278 150 - 400 K/uL  Basic metabolic panel     Status: Abnormal   Collection Time: 07/21/16  8:17 AM  Result Value Ref Range   Sodium 139 135 - 145 mmol/L   Potassium 3.8 3.5 - 5.1 mmol/L   Chloride 105 101 - 111 mmol/L   CO2 24 22 - 32 mmol/L   Glucose, Bld 105 (H) 65 - 99 mg/dL   BUN 6 6 - 20 mg/dL   Creatinine, Ser 0.85 0.44 - 1.00 mg/dL   Calcium 9.2 8.9 - 10.3 mg/dL   GFR calc non Af Amer >60 >60 mL/min   GFR calc Af Amer >60 >60 mL/min   Anion gap 10 5 - 15  TSH     Status: None   Collection Time: 07/21/16  8:17 AM  Result Value Ref Range   TSH 2.223 0.350 - 4.500 uIU/mL   Ct Angio Head W Or Wo Contrast  Result Date: 07/20/2016 CLINICAL DATA:  Code stroke. Last  seen normal at 1 15 a.m. RIGHT-sided weakness, RIGHT facial droop and aphasia. EXAM: CT ANGIOGRAPHY HEAD AND NECK TECHNIQUE: Multidetector CT imaging of the head and neck was performed using the standard protocol during bolus administration of intravenous contrast. Multiplanar CT image reconstructions and MIPs were obtained to evaluate the vascular anatomy. Carotid stenosis measurements (when applicable) are obtained utilizing NASCET criteria, using the distal internal carotid diameter as the denominator. CONTRAST:  50 cc Isovue 370 COMPARISON:  CT HEAD July 20, 2016 at 0245 hours FINDINGS: CTA NECK AORTIC ARCH: Normal appearance of the thoracic arch, 2 vessel arch is a normal variant. Mild calcific atherosclerosis. The origins of the innominate, left Common carotid artery and subclavian artery are widely patent. RIGHT CAROTID SYSTEM: Common carotid artery is widely patent, coursing in a straight line fashion. Normal appearance of the carotid bifurcation without  hemodynamically significant stenosis by NASCET criteria. Mild eccentric calcific atherosclerosis. Normal appearance of the included internal carotid artery. LEFT CAROTID SYSTEM: Common carotid artery is widely patent, coursing in a straight line fashion. Focal eccentric luminal regularity LEFT Common carotid artery origin of attributed to atherosclerosis. Normal appearance of the carotid bifurcation without hemodynamically significant stenosis by NASCET criteria. Moderate eccentric calcific atherosclerosis. Normal appearance of the included internal carotid artery. VERTEBRAL ARTERIES:Left vertebral artery is dominant. Moderate stenosis LEFT vertebral artery origin due to eccentric calcific atherosclerosis. Codominant vertebral arteries. Mild extrinsic deformity due to degenerative cervical spine. SKELETON: No acute osseous process though bone windows have not been submitted. Scattered dental caries. Severe C5-6 and C6-7 degenerative discs. Severe mid  cervical facet arthropathy. OTHER NECK: Soft tissues of the neck are non-acute though, not tailored for evaluation. Moderate centrilobular emphysema. CTA HEAD ANTERIOR CIRCULATION: Normal appearance of the cervical internal carotid arteries, petrous, cavernous and supra clinoid internal carotid arteries. Widely patent anterior communicating artery. Mild stenosis mid LEFT M1 segment and mild stenosis proximal LEFT M2 segments. Mild luminal regularity of the anterior and middle cerebral arteries. No large vessel occlusion, hemodynamically significant stenosis, dissection, luminal irregularity, contrast extravasation or aneurysm. POSTERIOR CIRCULATION: Normal appearance of the vertebral arteries, vertebrobasilar junction and basilar artery, as well as main branch vessels. Patent posterior cerebral arteries with moderate luminal irregularity. No large vessel occlusion, hemodynamically significant stenosis, dissection, contrast extravasation or aneurysm. VENOUS SINUSES: Major dural venous sinuses are patent though not tailored for evaluation on this angiographic examination. ANATOMIC VARIANTS: None. DELAYED PHASE: Not performed. IMPRESSION: CTA NECK: Moderate stenosis LEFT vertebral artery origin without hemodynamically significant stenosis by NASCET criteria. CTA HEAD: No emergent large vessel occlusion or high-grade stenosis. Intracranial atherosclerosis, most apparent within the posterior circulation resulting in moderate luminal irregularity. Acute findings discussed with and reconfirmed by [facility], Neurology on 07/20/2016 at 3:25 am. Electronically Signed   By: Elon Alas M.D.   On: 07/20/2016 03:26   Ct Head Wo Contrast  Result Date: 07/20/2016 CLINICAL DATA:  Unequal pupils, unable to kick RIGHT leg. Previous code stroke with RIGHT-sided weakness. Altered mental status. EXAM: CT HEAD WITHOUT CONTRAST TECHNIQUE: Contiguous axial images were obtained from the base of the skull through the vertex without  intravenous contrast. COMPARISON:  CT HEAD July 20, 2016 at 0258 hours FINDINGS: BRAIN: The ventricles and sulci are normal for age. No intraparenchymal hemorrhage, mass effect nor midline shift. Patchy supratentorial white matter hypodensities within normal range for patient's age, though non-specific are most compatible with chronic small vessel ischemic disease. Focal blurring of LEFT frontal gray-white matter differentiation, slightly propagated from prior CT. No abnormal extra-axial fluid collections. Basal cisterns are patent. VASCULAR: Moderate calcific atherosclerosis of the carotid siphons. SKULL: No skull fracture. No significant scalp soft tissue swelling. SINUSES/ORBITS: The mastoid air-cells and included paranasal sinuses are well-aerated.The included ocular globes and orbital contents are non-suspicious. OTHER: None. IMPRESSION: Minimal propagation of LEFT frontal lobe acute infarct. No hemorrhagic conversion. Otherwise stable examination including moderate chronic small vessel ischemic disease. Electronically Signed   By: Elon Alas M.D.   On: 07/20/2016 06:28   Ct Angio Neck W Or Wo Contrast  Result Date: 07/20/2016 CLINICAL DATA:  Code stroke. Last seen normal at 1 15 a.m. RIGHT-sided weakness, RIGHT facial droop and aphasia. EXAM: CT ANGIOGRAPHY HEAD AND NECK TECHNIQUE: Multidetector CT imaging of the head and neck was performed using the standard protocol during bolus administration of intravenous contrast. Multiplanar CT image reconstructions and MIPs were  obtained to evaluate the vascular anatomy. Carotid stenosis measurements (when applicable) are obtained utilizing NASCET criteria, using the distal internal carotid diameter as the denominator. CONTRAST:  50 cc Isovue 370 COMPARISON:  CT HEAD July 20, 2016 at 0245 hours FINDINGS: CTA NECK AORTIC ARCH: Normal appearance of the thoracic arch, 2 vessel arch is a normal variant. Mild calcific atherosclerosis. The origins of the  innominate, left Common carotid artery and subclavian artery are widely patent. RIGHT CAROTID SYSTEM: Common carotid artery is widely patent, coursing in a straight line fashion. Normal appearance of the carotid bifurcation without hemodynamically significant stenosis by NASCET criteria. Mild eccentric calcific atherosclerosis. Normal appearance of the included internal carotid artery. LEFT CAROTID SYSTEM: Common carotid artery is widely patent, coursing in a straight line fashion. Focal eccentric luminal regularity LEFT Common carotid artery origin of attributed to atherosclerosis. Normal appearance of the carotid bifurcation without hemodynamically significant stenosis by NASCET criteria. Moderate eccentric calcific atherosclerosis. Normal appearance of the included internal carotid artery. VERTEBRAL ARTERIES:Left vertebral artery is dominant. Moderate stenosis LEFT vertebral artery origin due to eccentric calcific atherosclerosis. Codominant vertebral arteries. Mild extrinsic deformity due to degenerative cervical spine. SKELETON: No acute osseous process though bone windows have not been submitted. Scattered dental caries. Severe C5-6 and C6-7 degenerative discs. Severe mid cervical facet arthropathy. OTHER NECK: Soft tissues of the neck are non-acute though, not tailored for evaluation. Moderate centrilobular emphysema. CTA HEAD ANTERIOR CIRCULATION: Normal appearance of the cervical internal carotid arteries, petrous, cavernous and supra clinoid internal carotid arteries. Widely patent anterior communicating artery. Mild stenosis mid LEFT M1 segment and mild stenosis proximal LEFT M2 segments. Mild luminal regularity of the anterior and middle cerebral arteries. No large vessel occlusion, hemodynamically significant stenosis, dissection, luminal irregularity, contrast extravasation or aneurysm. POSTERIOR CIRCULATION: Normal appearance of the vertebral arteries, vertebrobasilar junction and basilar artery, as  well as main branch vessels. Patent posterior cerebral arteries with moderate luminal irregularity. No large vessel occlusion, hemodynamically significant stenosis, dissection, contrast extravasation or aneurysm. VENOUS SINUSES: Major dural venous sinuses are patent though not tailored for evaluation on this angiographic examination. ANATOMIC VARIANTS: None. DELAYED PHASE: Not performed. IMPRESSION: CTA NECK: Moderate stenosis LEFT vertebral artery origin without hemodynamically significant stenosis by NASCET criteria. CTA HEAD: No emergent large vessel occlusion or high-grade stenosis. Intracranial atherosclerosis, most apparent within the posterior circulation resulting in moderate luminal irregularity. Acute findings discussed with and reconfirmed by [facility], Neurology on 07/20/2016 at 3:25 am. Electronically Signed   By: Elon Alas M.D.   On: 07/20/2016 03:26   Mr Brain Wo Contrast  Result Date: 07/21/2016 CLINICAL DATA:  Acute onset RIGHT hemiplegia and aphasia. Follow-up stroke. EXAM: MRI HEAD WITHOUT CONTRAST TECHNIQUE: Multiplanar, multiecho pulse sequences of the brain and surrounding structures were obtained without intravenous contrast. COMPARISON:  CT HEAD July 20, 2016 FINDINGS: BRAIN: Moderate at least 4 cm area of reduced diffusion LEFT posterior frontal lobe larger distribution on prior CT. Discrete small area mesial LEFT frontal lobe cortical reduced diffusion, all regions demonstrate low ADC values. No susceptibility artifact to suggest hemorrhage. The ventricles and sulci are normal for patient's age. Patchy FLAIR T2 hyperintense signal exclusive of the aforementioned abnormality. No suspicious parenchymal signal, masses or mass effect. No abnormal extra-axial fluid collections. No extra-axial masses though, contrast enhanced sequences would be more sensitive. VASCULAR: Normal major intracranial vascular flow voids present at skull base. SKULL AND UPPER CERVICAL SPINE: No abnormal  sellar expansion. No suspicious calvarial bone marrow signal. Craniocervical junction maintained. SINUSES/ORBITS:  Small LEFT mastoid effusion. The included ocular globes and orbital contents are non-suspicious. OTHER: None. IMPRESSION: Acute moderate nonhemorrhagic LEFT frontal lobe infarct (predominantly MCA and minimal ACA territories). Mild to moderate chronic small vessel ischemic disease. Electronically Signed   By: Elon Alas M.D.   On: 07/21/2016 04:35   Dg Swallowing Func-speech Pathology  Result Date: 07/20/2016 Objective Swallowing Evaluation: Type of Study: MBS-Modified Barium Swallow Study Patient Details Name: Andrea Kennedy MRN: [id number] Date of Birth: 1946/02/12 Today's Date: 07/20/2016 Time: SLP Start Time (ACUTE ONLY): 1305-SLP Stop Time (ACUTE ONLY): 1322 SLP Time Calculation (min) (ACUTE ONLY): 17 min Past Medical History: Past Medical History: Diagnosis Date . Lupus  . Sciatica of right side  . Tobacco abuse  Past Surgical History: Past Surgical History: Procedure Laterality Date . TOTAL HIP ARTHROPLASTY Right 06/03/2015  Procedure: RIGHT TOTAL HIP ARTHROPLASTY ANTERIOR APPROACH;  Surgeon: Gaynelle Arabian, MD;  Location: [facility];  Service: Orthopedics;  Laterality: Right; . TUBAL LIGATION   HPI: Renada Cronin a 70 y.o.femalewho presents to the Emergency Department with right sided weakness, right sided facial droop, right neglect and aphasia. CT minimal propagation of LEFT frontal lobe acute infarct. No hemorrhagic conversion. No Data Recorded Assessment / Plan / Recommendation CHL IP CLINICAL IMPRESSIONS 07/20/2016 Therapy Diagnosis -- Clinical Impression Mild oropharyngeal dysphagia marked by decreased bolus cohesion resulting in premature spill to valleculae with thin x 1 and once with oral residue to pyriform sinuses. Trace and flash penetration x 1 with straw. Recommend Dys 3 texture, thin liquids, small straw sips allowed, pills whole in applesauce.   Impact on safety and  function --   CHL IP TREATMENT RECOMMENDATION 07/20/2016 Treatment Recommendations Therapy as outlined in treatment plan below   Prognosis 07/20/2016 Prognosis for Safe Diet Advancement Good Barriers to Reach Goals -- Barriers/Prognosis Comment -- CHL IP DIET RECOMMENDATION 07/20/2016 SLP Diet Recommendations Dysphagia 3 (Mech soft) solids;Thin liquid Liquid Administration via Cup;Straw Medication Administration Whole meds with puree Compensations Slow rate;Small sips/bites Postural Changes Seated upright at 90 degrees   CHL IP OTHER RECOMMENDATIONS 07/20/2016 Recommended Consults -- Oral Care Recommendations Oral care BID Other Recommendations --   CHL IP FOLLOW UP RECOMMENDATIONS 07/20/2016 Follow up Recommendations Inpatient Rehab   CHL IP FREQUENCY AND DURATION 07/20/2016 Speech Therapy Frequency (ACUTE ONLY) min 2x/week Treatment Duration 2 weeks      CHL IP ORAL PHASE 07/20/2016 Oral Phase Impaired Oral - Pudding Teaspoon -- Oral - Pudding Cup -- Oral - Honey Teaspoon -- Oral - Honey Cup -- Oral - Nectar Teaspoon -- Oral - Nectar Cup -- Oral - Nectar Straw -- Oral - Thin Teaspoon -- Oral - Thin Cup Premature spillage;Piecemeal swallowing Oral - Thin Straw Piecemeal swallowing Oral - Puree Piecemeal swallowing Oral - Mech Soft -- Oral - Regular Delayed oral transit Oral - Multi-Consistency -- Oral - Pill -- Oral Phase - Comment --  CHL IP PHARYNGEAL PHASE 07/20/2016 Pharyngeal Phase Impaired Pharyngeal- Pudding Teaspoon -- Pharyngeal -- Pharyngeal- Pudding Cup -- Pharyngeal -- Pharyngeal- Honey Teaspoon -- Pharyngeal -- Pharyngeal- Honey Cup -- Pharyngeal -- Pharyngeal- Nectar Teaspoon -- Pharyngeal -- Pharyngeal- Nectar Cup -- Pharyngeal -- Pharyngeal- Nectar Straw -- Pharyngeal -- Pharyngeal- Thin Teaspoon -- Pharyngeal -- Pharyngeal- Thin Cup WFL Pharyngeal -- Pharyngeal- Thin Straw Penetration/Aspiration during swallow;Delayed swallow initiation-pyriform sinuses Pharyngeal Material enters airway, remains ABOVE  vocal cords then ejected out Pharyngeal- Puree Pharyngeal residue - valleculae;Reduced tongue base retraction Pharyngeal -- Pharyngeal- Mechanical Soft -- Pharyngeal -- Pharyngeal- Regular WFL Pharyngeal --  Pharyngeal- Multi-consistency -- Pharyngeal -- Pharyngeal- Pill -- Pharyngeal -- Pharyngeal Comment --  CHL IP CERVICAL ESOPHAGEAL PHASE 07/20/2016 Cervical Esophageal Phase WFL Pudding Teaspoon -- Pudding Cup -- Honey Teaspoon -- Honey Cup -- Nectar Teaspoon -- Nectar Cup -- Nectar Straw -- Thin Teaspoon -- Thin Cup -- Thin Straw -- Puree -- Mechanical Soft -- Regular -- Multi-consistency -- Pill -- Cervical Esophageal Comment -- No flowsheet data found. [place] Siren 07/20/2016, 3:08 PM Orbie Pyo Colvin Caroli.Ed CCC-SLP Pager [phone]              Ct Head Code Stroke Wo Contrast`  Result Date: 07/20/2016 CLINICAL DATA:  Code stroke. Last seen normal at 1 15 a.m.: RIGHT-sided weakness, RIGHT facial droop and aphasia. EXAM: CT HEAD WITHOUT CONTRAST TECHNIQUE: Contiguous axial images were obtained from the base of the skull through the vertex without intravenous contrast. COMPARISON:  None. FINDINGS: BRAIN: The ventricles and sulci are normal for age. No intraparenchymal hemorrhage, mass effect nor midline shift. Patchy supratentorial white matter hypodensities within normal range for patient's age, though non-specific are most compatible with chronic small vessel ischemic disease. Small area of focal blurring LEFT posterior frontal lobe/ precentral gyrus. No abnormal extra-axial fluid collections. Basal cisterns are patent. VASCULAR: Moderate calcific atherosclerosis of the carotid siphons. SKULL: No skull fracture. No significant scalp soft tissue swelling. SINUSES/ORBITS: The mastoid air-cells and included paranasal sinuses are well-aerated.The included ocular globes and orbital contents are non-suspicious. OTHER: None. ASPECTS [facility] Stroke Program Early CT Score) - Ganglionic level infarction  (caudate, lentiform nuclei, internal capsule, insula, M1-M3 cortex): 7 - Supraganglionic infarction (M4-M6 cortex): 6 Total score (0-10 with 10 being normal): 9 IMPRESSION: 1. Small suspected acute LEFT posterior frontal lobe infarct. Moderate chronic small vessel ischemic disease. 2. ASPECTS is 9. Critical Value/emergent results were called by telephone at the time of interpretation on 07/20/2016 at 2:58 am to Dr. Cheral Marker, Neurology, who verbally acknowledged these results. Electronically Signed   By: Elon Alas M.D.   On: 07/20/2016 02:59    Assessment/Plan: Diagnosis: Right hemiparesis and aphasia secondary to left frontal infarct 1. Does the need for close, 24 hr/day medical supervision in concert with the patient's rehab needs make it unreasonable for this patient to be served in a less intensive setting? Yes 2. Co-Morbidities requiring supervision/potential complications: Cocaine abuse, tobacco abuse, lupus, uncontrolled hypertension 3. Due to bladder management, bowel management, safety, skin/wound care, disease management, medication administration, pain management and patient education, does the patient require 24 hr/day rehab nursing? Yes 4. Does the patient require coordinated care of a physician, rehab nurse, PT (1-2 hrs/day, 5 days/week), OT (1-2 hrs/day, 5 days/week) and SLP (.5-1 hrs/day, 5 days/week) to address physical and functional deficits in the context of the above medical diagnosis(es)? Yes Addressing deficits in the following areas: balance, endurance, locomotion, strength, transferring, bowel/bladder control, bathing, dressing, feeding, grooming, toileting, cognition, speech, language and psychosocial support 5. Can the patient actively participate in an intensive therapy program of at least 3 hrs of therapy per day at least 5 days per week? Yes 6. The potential for patient to make measurable gains while on inpatient rehab is excellent 7. Anticipated functional outcomes  upon discharge from inpatient rehab are supervision  with PT, supervision with OT, supervision with SLP. 8. Estimated rehab length of stay to reach the above functional goals is: 10-12 d 9. Does the patient have adequate social supports and living environment to accommodate these discharge functional goals? Yes 10. Anticipated D/C setting:  Home 11. Anticipated post D/C treatments: Outpatient therapy 12. Overall Rehab/Functional Prognosis: excellent  RECOMMENDATIONS: This patient's condition is appropriate for continued rehabilitative care in the following setting: CIR Patient has agreed to participate in recommended program. Potentially Note that insurance prior authorization may be required for reimbursement for recommended care.  Comment: Needs to control blood pressure off IV hydralazine  Dan Chapman PA  07/21/2016

## 2016-07-21 NOTE — Progress Notes (Signed)
*  PRELIMINARY RESULTS* Vascular Ultrasound Bilateral lower extremity venous duplex has been completed.  Preliminary findings: No evidence of deep vein thrombosis or baker's cysts bilaterally.   Everrett Coombe 07/21/2016, 10:57 AM

## 2016-07-21 NOTE — Progress Notes (Addendum)
Called regarding elevated BP. Has mild bradycardia and therefore labetalol not a good option. Order placed for 2 mg IV hydralazine x 1. Lisinopril scheduled dose has been started by Dr. Erlinda Hong.    Electronically signed: Dr. Kerney Elbe

## 2016-07-21 NOTE — Progress Notes (Addendum)
STROKE TEAM PROGRESS NOTE   SUBJECTIVE (INTERVAL HISTORY) PT is at pt beside for therapy. She is sitting in chair, much improved from yesterday. She still has right facial droop and dysarthria but mild to moderate aphasia. She still has right arm weakness. UDS showed positive for cocaine.    OBJECTIVE Temp:  [97.4 F (36.3 C)-99 F (37.2 C)] 97.4 F (36.3 C) (12/07 0800) Pulse Rate:  [49-65] 55 (12/07 0800) Cardiac Rhythm: Normal sinus rhythm (12/07 0800) Resp:  [12-21] 14 (12/07 0800) BP: (109-184)/(65-101) 175/65 (12/07 0800) SpO2:  [93 %-97 %] 94 % (12/07 0800)  CBC:   Recent Labs Lab 07/20/16 0235 07/20/16 0241 07/21/16 0817  WBC 7.3  --  6.0  NEUTROABS 4.8  --   --   HGB 12.4 12.9 11.9*  HCT 37.9 38.0 36.4  MCV 101.3*  --  100.0  PLT 270  --  426    Basic Metabolic Panel:   Recent Labs Lab 07/20/16 0235 07/20/16 0241 07/21/16 0817  NA 138 140 139  K 3.8 3.9 3.8  CL 106 105 105  CO2 23  --  24  GLUCOSE 113* 112* 105*  BUN 18 23* 6  CREATININE 1.10* 1.00 0.85  CALCIUM 9.5  --  9.2    Lipid Panel:     Component Value Date/Time   CHOL 181 07/20/2016 0440   TRIG 85 07/20/2016 0440   HDL 62 07/20/2016 0440   CHOLHDL 2.9 07/20/2016 0440   VLDL 17 07/20/2016 0440   LDLCALC 102 (H) 07/20/2016 0440   HgbA1c:  Lab Results  Component Value Date   HGBA1C 5.2 07/20/2016   Urine Drug Screen:     Component Value Date/Time   LABOPIA NONE DETECTED 07/20/2016 1503   COCAINSCRNUR POSITIVE (A) 07/20/2016 1503   LABBENZ NONE DETECTED 07/20/2016 1503   AMPHETMU NONE DETECTED 07/20/2016 1503   THCU NONE DETECTED 07/20/2016 1503   LABBARB NONE DETECTED 07/20/2016 1503      IMAGING I have personally reviewed the radiological images below and agree with the radiology interpretations.  Ct Head Code Stroke Wo Contrast` 07/20/2016 1. Small suspected acute LEFT posterior frontal lobe infarct. Moderate chronic small vessel ischemic disease. 2. ASPECTS is 9.   Ct  Head Wo Contrast 07/20/2016 Minimal propagation of LEFT frontal lobe acute infarct. No hemorrhagic conversion. Otherwise stable examination including moderate chronic small vessel ischemic disease.  CTA NECK 07/20/2016 Moderate stenosis LEFT vertebral artery origin without hemodynamically significant stenosis by NASCET criteria.   CTA HEAD 07/20/2016 No emergent large vessel occlusion or high-grade stenosis. Intracranial atherosclerosis, most apparent within the posterior circulation resulting in moderate luminal irregularity.  MRI  07/21/2016 Acute moderate nonhemorrhagic LEFT frontal lobe infarct (predominantly MCA and minimal ACA territories). Mild to moderate chronic small vessel ischemic disease.  EEG  07/20/2016 This awake and asleep EEG is abnormal due to focal slowing over the left hemisphere.  TTE 07/20/2016 Left ventricle:  The cavity size was normal. Wall thickness was normal. Systolic function was normal. The estimated ejection fraction was in the range of 50% to 55%. Wall motion was normal; there were no regional wall motion abnormalities.  LE venous Doppler  07/21/2016 No evidence of deep vein thrombosis or baker's cysts bilaterally.   PHYSICAL EXAM  Temp:  [97.4 F (36.3 C)-99 F (37.2 C)] 97.4 F (36.3 C) (12/07 0800) Pulse Rate:  [49-65] 55 (12/07 0800) Resp:  [12-21] 14 (12/07 0800) BP: (109-184)/(65-101) 175/65 (12/07 0800) SpO2:  [93 %-97 %] 94 % (  12/07 0800)  General - Well nourished, well developed, in no apparent distress.  Ophthalmologic - Fundi not visualized due to noncooperation.  Cardiovascular - Regular rate and rhythm.  Mental Status -  Level of arousal and orientation to self were intact, however not able to answer other orientation questions due to aphasia. Language exam showed moderate to severe dysarthria, able to follow simple commands, speech hesitation and word finding difficulties, able to repeat one simple sentence also  dysarthric, but not able to name. Right neglect resolved.   Cranial Nerves II - XII - II - blinking to visual threat bilaterally. III, IV, VI - Extraocular movements intact V - Facial sensation intact. VII - right facial droop VIII - Hearing & vestibular intact bilaterally. X - Palate elevates symmetrically, severe dysarthria. XI - Chin turning & shoulder shrug intact bilaterally. XII - Tongue protrusion midline.  Motor Strength - The patient's strength was 5/5 LUE and LLE, RUE 3-/5 proximal and 0/5 distal and RLE 4/5.  Bulk was normal and fasciculations were absent.   Motor Tone - Muscle tone was assessed at the neck and appendages and was increased on the RUE and RLE.  Reflexes - The patient's reflexes were 1+ in all extremities and she had no pathological reflexes.  Sensory - Light touch, temperature/pinprick were assessed and were symmetrical.    Coordination - not cooperative on exam.  Tremor was absent.  Gait and Station - not tested.   ASSESSMENT/PLAN Ms. Harjit Douds is a 70 y.o. female with history of lupus and tobacco abuse presenting with R hemiplegia and aphasia. She received IV t-PA 07/20/2016 at 0312.   Stroke:  Dominant left frontal infarct s/p tPA, secondary to unknown source. UDS positive for cocaine. Need further work up due to [place] of lupus  Resultant  R hemiparesis, aphasia, dysarthria  CTA head  No significant stenosis  CTA neck  Mod L VA stenosis, b/l siphon nonflow limiting athero  S/p tPA with neuro worsening - CT head L frontal infarct, no hemorrhage  MRI  left MCA frontal lobe infarct  2D Echo  EF 50-55 %. No source of embolus    EEG left-sided slowing. No seizure activity  LE venous doppler negative for DVT  Pt need TEE to rule out mar[place] endocarditis given hx of lupus   LDL 102  HgbA1c 5.2  UDS positive for cocaine  Hypercoagulable and autoimmune work up pending  lovenox for VTE prophylaxis DIET DYS 3 Room service appropriate? Yes;  Fluid consistency: Thin  aspirin 81 mg daily prior to admission, now on ASA '325mg'$ .  Ongoing aggressive stroke risk factor management  Therapy recommendations:  CIR  Disposition:  pending   Cocaine abuse  UDS positive for cocaine  Pt admitted cocaine use  Cessation counseling provided  Pt is willing to quit  Hypertension  No hx Hypertension  Elevated during the night, as high as 182/77   Labetalol avoided given bradycardia in the 50s  Permissive hypertension (OK if < 180/105) but gradually normalize in 5-7 days Long-term BP goal normotensive  Possible seizure  Inconsistent story from family vs. EMS  No further acitivity  EEG no seizures. Left-sided slowing  ? Lupus  Listed as PMH  Will need further clarification from family  autoimmune work up, lupus anticoagulant, need TEE to rule out mar[place] endocarditis. So far lupus CNS vasculitis less likely.   LE venous doppler negative for DVT.   Hyperlipidemia  Home meds:  No statin  LDL 102, goal < 70  Add lipitor '20mg'$   Continue statin at discharge  Tobacco abuse  Current smoker  Smoking cessation counseling will be provided  Nicotine patch   Other Stroke Risk Factors  Advanced age  Other Active Problems  R sciatica  Bradycardia, 48s  Hospital day # 1  This patient is critically ill due to left MCA stroke s/p tPA, aphasia, dysarthria, ? lupus and at significant risk of neurological worsening, death form recurrent stroke, hemorrhagic transformation, heart failure, brain herniation. This patient's care requires constant monitoring of vital signs, hemodynamics, respiratory and cardiac monitoring, review of multiple databases, neurological assessment, discussion with family, other specialists and medical decision making of high complexity. I spent 35 minutes of neurocritical care time in the care of this patient.  Rosalin Hawking, MD PhD Stroke Neurology 07/21/2016 8:31 PM   To contact Stroke  Continuity provider, please refer to [email or web address] After hours, contact General Neurology

## 2016-07-21 NOTE — Progress Notes (Signed)
Checked pt's status with health care provider, confirming pt still not able to do advanced directive. Offered silent prayer for pt.   07/21/16 1600  Clinical Encounter Type  Visited With Patient;Health care provider  Visit Type Follow-up;Critical Care  Referral From [place] Farrin Shadle, Chaplain

## 2016-07-21 NOTE — Progress Notes (Signed)
Occupational Therapy Evaluation Patient Details Name: Andrea Kennedy MRN: [id number] DOB: March 14, 1946 Today's Date: 07/21/2016    History of Present Illness Patient is a 70 y/o female with hx of R THA, tobacco abuse and lupus presents with right-sided weakness and aphasia, which started with jerking on the left side, urinary incontinence. MRI-Acute moderate nonhemorrhagic LEFT frontal lobe infarct. s/p tPA; + cocaine.  NIHSS of 15 upon arrival.   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 Pt currently min A for mobility and mod A for ADL. Pt is excellent CIR candidate. Feel pt can reach mod I level of independence to facilitate safe DC home. Will follow acutely to address established goals.    Follow Up Recommendations  CIR;Supervision/Assistance - 24 hour    Equipment Recommendations  3 in 1 bedside commode;Other (comment) (TBA)    Recommendations for Other Services       Precautions / Restrictions Precautions Precautions: Fall Restrictions Weight Bearing Restrictions: No      Mobility Bed Mobility Overal bed mobility: Needs Assistance Bed Mobility: Sit to Supine       Sit to supine: Min assist   General bed mobility comments: Difficulty managin RUE in bed. Good trunk control  Transfers Overall transfer level: Needs assistance   Transfers: Sit to/from Stand;Stand Pivot Transfers Sit to Stand: Min assist Stand pivot transfers: Min assist            Balance     Sitting balance-Leahy Scale: Good     Standing balance support: During functional activity Standing balance-Leahy Scale: Fair                              ADL Overall ADL's : Needs assistance/impaired Eating/Feeding: Minimal assistance Eating/Feeding Details (indicate cue type and reason): difficulty manipulating utensil with L hand. Difficulty scooping food onto fork. Minimal pocketing R cheek, but clearing it  independently Grooming: Moderate assistance   Upper Body Bathing: Minimal assistance;Bed level   Lower Body Bathing: Minimal assistance;Sit to/from stand   Upper Body Dressing : Moderate assistance;Sitting   Lower Body Dressing: Moderate assistance;Sit to/from stand   Toilet Transfer: Minimal assistance;Stand-pivot   Toileting- Clothing Manipulation and Hygiene: Minimal assistance;Sit to/from stand       Functional mobility during ADLs: Minimal assistance General ADL Comments: Very motivated to complete her own self care. Poor awraeness of RUE during tasks     Vision Additional Comments: Will further assess. ? R field deficit   Perception Perception Comments: will further assess. inattention to RUE   Praxis      Pertinent Vitals/Pain Pain Assessment: Faces Faces Pain Scale: No hurt     Hand Dominance Right   Extremity/Trunk Assessment Upper Extremity Assessment Upper Extremity Assessment: RUE deficits/detail RUE Deficits / Details: Brunstrom level II hand; IV arm - deviating from synergy. able to extend RUE against gravity. Minimal spasticity RUE. minimal active finger flexion. likely imparied sensation.    Lower Extremity Assessment Lower Extremity Assessment: Defer to PT evaluation RLE Deficits / Details: Grossly ~4/5 throughout.   Cervical / Trunk Assessment Cervical / Trunk Assessment: Normal   Communication Communication Communication: Expressive difficulties   Cognition Arousal/Alertness: Awake/alert Behavior During Therapy: Impulsive (at times) Overall Cognitive Status: Difficult to assess Area of Impairment: Awareness           Awareness: Emergent   General Comments: Not aware of needing to bring her  R impaired arm with her. 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  Lives With: Spouse    Prior Functioning/Environment Level of Independence: Independent                 OT Problem List: Decreased strength;Decreased range of motion;Decreased activity tolerance;Impaired balance (sitting and/or standing);Impaired vision/perception;Decreased coordination;Decreased safety awareness;Decreased cognition;Decreased knowledge of use of DME or AE;Impaired sensation;Impaired tone;Impaired UE functional use   OT Treatment/Interventions: Self-care/ADL training;Therapeutic exercise;Neuromuscular education;DME and/or AE instruction;Therapeutic activities;Cognitive remediation/compensation;Visual/perceptual remediation/compensation;Patient/family education;Balance training    OT Goals(Current goals can be found in the care plan section) Acute Rehab OT Goals Patient Stated Goal: to get better OT Goal Formulation: With patient Time For Goal Achievement: 08/04/16 Potential to Achieve Goals: Good ADL Goals Pt Will Perform Eating: with modified independence;with adaptive utensils;sitting Pt Will Perform Grooming: with modified independence;sitting Pt Will Perform Upper Body Bathing: with modified independence;sitting Pt Will Perform Lower Body Bathing: with modified independence;sit to/from stand Pt Will Perform Upper Body Dressing: with modified independence;sitting Pt Will Perform Lower Body Dressing: with modified independence;sit to/from stand Pt Will Transfer to Toilet: with modified independence;bedside commode;ambulating Additional ADL Goal #1: Pt will use RUE as funcitonal assist during ADL tasks  OT Frequency: Min  2X/week   Barriers to D/C:            Co-evaluation              End of Session Nurse Communication: Mobility status  Activity Tolerance: Patient tolerated treatment well Patient left: in bed;with call bell/phone within reach   Time: 1401-1431 OT Time Calculation (min): 30 min Charges:  OT General Charges $OT Visit: 1 Procedure OT Evaluation $OT Eval Moderate Complexity: 1 Procedure OT Treatments $Self Care/Home Management : 8-22 mins G-Codes:    Aletheia Tangredi,HILLARY 08/17/16, 3:01 PM   [facility], OTR/L  [phone] 08-17-2016

## 2016-07-21 NOTE — Evaluation (Signed)
Physical Therapy Evaluation Patient Details Name: Andrea Kennedy MRN: [id number] DOB: [birth year] Today's Date: 07/21/2016   History of Present Illness  Patient is a 70 y/o female with hx of R THA, tobacco abuse and lupus presents with right-sided weakness and aphasia, which started with jerking on the left side, urinary incontinence. MRI-Acute moderate nonhemorrhagic LEFT frontal lobe infarct. s/p tPA; + cocaine.  NIHSS of 15 upon arrival.  Clinical Impression  Patient presents with right hemiparesis (RUE>RLE), expressive aphasia, right inattention, and impaired mobility s/p above. Tolerated SPT x2 with Min A for balance/safety. Pt with some intelligible words during session. 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 Would benefit from CIR to maximize independence and mobility prior to return home. Will follow acutely.     Follow Up Recommendations CIR    Equipment Recommendations  Other (comment) (TBA)    Recommendations for Other Services Rehab consult;OT consult     Precautions / Restrictions Precautions Precautions: Fall Precaution Comments: right hemiparesis and aphasia Restrictions Weight Bearing Restrictions: No      Mobility  Bed Mobility Overal bed mobility: Needs Assistance Bed Mobility: Supine to Sit     Supine to sit: Supervision;HOB elevated     General bed mobility comments: Able to get to EOB without assist, increased time. Not using RUE.  Transfers Overall transfer level: Needs assistance Equipment used: None Transfers: Sit to/from [facility] Sit to Stand: Min assist Stand pivot transfers: Min assist       General transfer comment: Assist to power to standing and for SPT x2 from bed to [facility] and from [facility] to chair. RIght knee instability noted but no buckling.  Ambulation/Gait Ambulation/Gait assistance: Min assist Ambulation Distance (Feet): 4 Feet Assistive device: 1 person hand held assist Gait  Pattern/deviations: Step-to pattern;Decreased stride length Gait velocity: decreased Gait velocity interpretation: Below normal speed for age/gender General Gait Details: Guarded gait- Able to take a few steps in room but reports dizziness so needed to sit. Right knee instability but no buckling.  Stairs            Wheelchair Mobility    Modified Rankin (Stroke Patients Only) Modified Rankin (Stroke Patients Only) Pre-Morbid Rankin Score: No symptoms Modified Rankin: Moderately severe disability     Balance Overall balance assessment: Needs assistance Sitting-balance support: Feet supported;No upper extremity supported Sitting balance-Leahy Scale: Fair Sitting balance - Comments: Able to perform pericare sitting on BSC without LOB.   Standing balance support: During functional activity Standing balance-Leahy Scale: Fair Standing balance comment: Able to stand statically without UE support but requires Min A for dynamic standing.                             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    Prior Function Level of Independence: Independent         Comments: Drives, cooks, cleans, caregiver     Hand Dominance   Dominant Hand: Right    Extremity/Trunk Assessment   Upper Extremity Assessment: Defer to OT evaluation (No active movement right hand, wrist, able to  initiate contraction triceps>biceps. Use of traps to initiate shoulder elevation.)           Lower  Extremity Assessment: RLE deficits/detail RLE Deficits / Details: Grossly ~4/5 throughout.    Cervical / Trunk Assessment: Normal  Communication   Communication: Expressive difficulties  Cognition Arousal/Alertness: Awake/alert Behavior During Therapy: WFL for tasks assessed/performed Overall Cognitive Status: Difficult to assess Area of Impairment: Following commands       Following Commands: Follows multi-step commands consistently       General Comments: A&O x4; able to follow simple 1-2 step commands. Some words intelligible, others not. Some right inattention noted.     General Comments      Exercises     Assessment/Plan    PT Assessment Patient needs continued PT services  PT Problem List Decreased strength;Decreased mobility;Impaired tone;Decreased range of motion;Decreased cognition;Decreased activity tolerance;Decreased balance;Decreased knowledge of use of DME          PT Treatment Interventions Therapeutic activities;DME instruction;Cognitive remediation;Therapeutic exercise;Gait training;Patient/family education;Balance training;Functional mobility training;Stair training;Neuromuscular re-education    PT Goals (Current goals can be found in the Care Plan section)  Acute Rehab PT Goals Patient Stated Goal: to return to independence PT Goal Formulation: With patient Time For Goal Achievement: 08/04/16 Potential to Achieve Goals: Good    Frequency Min 4X/week   Barriers to discharge Decreased caregiver support;Inaccessible home environment stairs to enter home and not sure of level of support spouse can provide    Co-evaluation               End of Session Equipment Utilized During Treatment: Gait belt Activity Tolerance: Patient tolerated treatment well Patient left: in chair;with call bell/phone within reach;Other (comment) (Dr. Erlinda Hong present at end of session) Nurse Communication: Mobility status         Time: [phone] PT Time Calculation  (min) (ACUTE ONLY): 23 min   Charges:   PT Evaluation $PT Eval Moderate Complexity: 1 Procedure PT Treatments $Therapeutic Activity: 8-22 mins   PT G Codes:        Kevante Lunt A Wen Munford 07/21/2016, 10:35 AM Wray Kearns, PT, DPT [phone]

## 2016-07-22 LAB — MPO/PR-3 (ANCA) ANTIBODIES: Myeloperoxidase Abs: <9 U/mL (ref 0.0–9.0)

## 2016-07-22 LAB — BETA-2-GLYCOPROTEIN I ABS, IGG/M/A
BETA-2-GLYCOPROTEIN I IGM: 16 GPI IgM units (ref 0–32)
Beta-2 Glyco I IgG: <9 GPI IgG units (ref 0–20)

## 2016-07-22 LAB — GLUCOSE, CAPILLARY: GLUCOSE-CAPILLARY: 197 mg/dL — AB (ref 65–99)

## 2016-07-22 LAB — BASIC METABOLIC PANEL
ANION GAP: 8 (ref 5–15)
BUN: 7 mg/dL (ref 6–20)
CHLORIDE: 104 mmol/L (ref 101–111)
CO2: 29 mmol/L (ref 22–32)
Calcium: 9.3 mg/dL (ref 8.9–10.3)
Creatinine, Ser: 0.95 mg/dL (ref 0.44–1.00)
Glucose, Bld: 104 mg/dL — ABNORMAL HIGH (ref 65–99)
POTASSIUM: 4.2 mmol/L (ref 3.5–5.1)
SODIUM: 141 mmol/L (ref 135–145)

## 2016-07-22 LAB — ANCA TITERS: C-ANCA: <1:20 {titer}

## 2016-07-22 LAB — CBC
HCT: 36.5 % (ref 36.0–46.0)
HEMOGLOBIN: 11.8 g/dL — AB (ref 12.0–15.0)
MCH: 32.7 pg (ref 26.0–34.0)
MCHC: 32.3 g/dL (ref 30.0–36.0)
MCV: 101.1 fL — ABNORMAL HIGH (ref 78.0–100.0)
PLATELETS: 265 10*3/uL (ref 150–400)
RBC: 3.61 MIL/uL — AB (ref 3.87–5.11)
RDW: 15 % (ref 11.5–15.5)
WBC: 4.9 10*3/uL (ref 4.0–10.5)

## 2016-07-22 LAB — C3 COMPLEMENT: C3 COMPLEMENT: 104 mg/dL (ref 82–167)

## 2016-07-22 LAB — RHEUMATOID FACTOR

## 2016-07-22 LAB — SJOGRENS SYNDROME-B EXTRACTABLE NUCLEAR ANTIBODY: SSB (La) (ENA) Antibody, IgG: <0.2 AI (ref 0.0–0.9)

## 2016-07-22 LAB — LUPUS ANTICOAGULANT PANEL
DRVVT: 31.2 s (ref 0.0–47.0)
PTT Lupus Anticoagulant: 30.8 s (ref 0.0–51.9)

## 2016-07-22 LAB — ANTI-DNA ANTIBODY, DOUBLE-STRANDED

## 2016-07-22 LAB — ANTINUCLEAR ANTIBODIES, IFA: ANTINUCLEAR ANTIBODIES, IFA: NEGATIVE

## 2016-07-22 LAB — HOMOCYSTEINE: Homocysteine: 14.1 umol/L (ref 0.0–15.0)

## 2016-07-22 LAB — SJOGRENS SYNDROME-A EXTRACTABLE NUCLEAR ANTIBODY

## 2016-07-22 LAB — C4 COMPLEMENT: Complement C4, Body Fluid: 24 mg/dL (ref 14–44)

## 2016-07-22 LAB — ANTI-SMITH ANTIBODY: ENA SM Ab Ser-aCnc: <0.2 AI (ref 0.0–0.9)

## 2016-07-22 MED ORDER — CLOPIDOGREL BISULFATE 75 MG PO TABS
75.0000 mg | ORAL_TABLET | Freq: Every day | ORAL | Status: DC
Start: 2016-07-23 — End: 2016-07-23

## 2016-07-22 MED ORDER — VITAMIN B-12 1000 MCG PO TABS
1000.0000 ug | ORAL_TABLET | Freq: Every day | ORAL | Status: DC
  Administered 2016-07-22 – 2016-07-25 (×4): 1000 ug via ORAL
  Filled 2016-07-22 (×5): qty 1

## 2016-07-22 NOTE — Progress Notes (Signed)
    [facility] has been requested to perform a transesophageal echocardiogram on Andrea Kennedy for CVA on 07/25/16.  After careful review of history and examination, the risks and benefits of transesophageal echocardiogram have been explained including risks of esophageal damage, perforation (1:10,000 risk), bleeding, pharyngeal hematoma as well as other potential complications associated with conscious sedation including aspiration, arrhythmia, respiratory failure and death. Alternatives to treatment were discussed, questions were answered. Patient is willing to proceed.   Angelena Form, PA-C  07/22/2016 12:19 PM

## 2016-07-22 NOTE — Progress Notes (Signed)
Pt admitted to the unit at 1215. Pt mental status is alert and oruiented. Pt oriented to room, staff, and call bell. Skin is intact. Husband at bedside. Call bell within reach.

## 2016-07-22 NOTE — Progress Notes (Signed)
STROKE TEAM PROGRESS NOTE   SUBJECTIVE (INTERVAL HISTORY) Husband is at pt beside today. She is sitting in bed, still has right facial droop and dysarthria and mild to moderate aphasia. Right arm weakness much improved. Patient and husband admitted that she had lupus history, and was on prednisone treatment, patient stopped prednisone by her own.    OBJECTIVE Temp:  [97.8 F (36.6 C)-99 F (37.2 C)] 98.1 F (36.7 C) (12/08 0800) Pulse Rate:  [47-63] 47 (12/08 0800) Cardiac Rhythm: Sinus bradycardia (12/08 0800) Resp:  [13-21] 15 (12/08 0800) BP: (105-158)/(51-84) 137/84 (12/08 0800) SpO2:  [90 %-98 %] 98 % (12/08 0800)  CBC:   Recent Labs Lab 07/20/16 0235  07/21/16 0817 07/22/16 0317  WBC 7.3  --  6.0 4.9  NEUTROABS 4.8  --   --   --   HGB 12.4  < > 11.9* 11.8*  HCT 37.9  < > 36.4 36.5  MCV 101.3*  --  100.0 101.1*  PLT 270  --  278 265  < > = values in this interval not displayed.  Basic Metabolic Panel:   Recent Labs Lab 07/21/16 0817 07/22/16 0317  NA 139 141  K 3.8 4.2  CL 105 104  CO2 24 29  GLUCOSE 105* 104*  BUN 6 7  CREATININE 0.85 0.95  CALCIUM 9.2 9.3    Lipid Panel:     Component Value Date/Time   CHOL 181 07/20/2016 0440   TRIG 85 07/20/2016 0440   HDL 62 07/20/2016 0440   CHOLHDL 2.9 07/20/2016 0440   VLDL 17 07/20/2016 0440   LDLCALC 102 (H) 07/20/2016 0440   HgbA1c:  Lab Results  Component Value Date   HGBA1C 5.2 07/20/2016   Urine Drug Screen:     Component Value Date/Time   LABOPIA NONE DETECTED 07/20/2016 1503   COCAINSCRNUR POSITIVE (A) 07/20/2016 1503   LABBENZ NONE DETECTED 07/20/2016 1503   AMPHETMU NONE DETECTED 07/20/2016 1503   THCU NONE DETECTED 07/20/2016 1503   LABBARB NONE DETECTED 07/20/2016 1503      IMAGING I have personally reviewed the radiological images below and agree with the radiology interpretations.  Ct Head Code Stroke Wo Contrast` 07/20/2016 1. Small suspected acute LEFT posterior frontal lobe  infarct. Moderate chronic small vessel ischemic disease.  2. ASPECTS is 9.    Ct Head Wo Contrast 07/20/2016 Minimal propagation of LEFT frontal lobe acute infarct. No hemorrhagic conversion. Otherwise stable examination including moderate chronic small vessel ischemic disease.  CTA NECK 07/20/2016 Moderate stenosis LEFT vertebral artery origin without hemodynamically significant stenosis by NASCET criteria.   CTA HEAD 07/20/2016 No emergent large vessel occlusion or high-grade stenosis. Intracranial atherosclerosis, most apparent within the posterior circulation resulting in moderate luminal irregularity.  MRI  07/21/2016 Acute moderate nonhemorrhagic LEFT frontal lobe infarct (predominantly MCA and minimal ACA territories). Mild to moderate chronic small vessel ischemic disease.  EEG  07/20/2016 This awake and asleep EEG is abnormal due to focal slowing over the left hemisphere.  TTE 07/20/2016 Left ventricle:  The cavity size was normal. Wall thickness was normal. Systolic function was normal. The estimated ejection fraction was in the range of 50% to 55%. Wall motion was normal; there were no regional wall motion abnormalities.  LE venous Doppler  07/21/2016 No evidence of deep vein thrombosis or baker's cysts bilaterally.   PHYSICAL EXAM  Temp:  [97.8 F (36.6 C)-99 F (37.2 C)] 98.1 F (36.7 C) (12/08 0800) Pulse Rate:  [47-63] 47 (12/08 0800) Resp:  [  13-21] 15 (12/08 0800) BP: (105-158)/(51-84) 137/84 (12/08 0800) SpO2:  [90 %-98 %] 98 % (12/08 0800)  General - Well nourished, well developed, in no apparent distress.  Ophthalmologic - Fundi not visualized due to noncooperation.  Cardiovascular - Regular rate and rhythm.  Mental Status -  Level of arousal and orientation to self were intact, however not able to answer other orientation questions due to aphasia. Language exam showed moderate dysarthria, able to follow simple commands, expressive aphasia, able  to repeat one simple sentence also dysarthric, but not able to name. No neglect  Cranial Nerves II - XII - II - blinking to visual threat bilaterally. III, IV, VI - Extraocular movements intact V - Facial sensation intact. VII - right facial droop VIII - Hearing & vestibular intact bilaterally. X - Palate elevates symmetrically, moderate dysarthria. XI - Chin turning & shoulder shrug intact bilaterally. XII - Tongue protrusion midline.  Motor Strength - The patient's strength was 5/5 LUE and LLE, RUE 4-/5 proximal and 2/5 distal and RLE 5-/5.  Bulk was normal and fasciculations were absent.   Motor Tone - Muscle tone was assessed at the neck and appendages and was increased on the RUE and RLE.  Reflexes - The patient's reflexes were 1+ in all extremities and she had no pathological reflexes.  Sensory - Light touch, temperature/pinprick were assessed and were symmetrical.    Coordination - not cooperative on exam.  Tremor was absent.  Gait and Station - not tested.   ASSESSMENT/PLAN [name] is a 70 y.o. female with history of lupus and tobacco abuse presenting with R hemiplegia and aphasia. She received IV t-PA 07/20/2016 at 0312.   Stroke:  Dominant left frontal infarct s/p tPA, secondary to unknown source. UDS positive for cocaine. Hypercoagulable and autoimmune workup so far negative. TEE pending  Resultant  R hemiparesis, aphasia, dysarthria  CTA head  No significant stenosis  CTA neck  Mod L VA stenosis, b/l siphon nonflow limiting athero  S/p tPA with neuro worsening - CT head L frontal infarct, no hemorrhage  MRI  left MCA frontal lobe infarct  2D Echo  EF 50-55 %. No source of embolus    EEG left-sided slowing. No seizure activity  LE venous doppler negative for DVT  TEE Monday to rule out marantic endocarditis given hx of lupus   LDL 102  HgbA1c 5.2  UDS positive for cocaine  Hypercoagulable and autoimmune work up so far negative  lovenox for  VTE prophylaxis DIET DYS 3 Room service appropriate? Yes; Fluid consistency: Thin  aspirin 81 mg daily prior to admission, now on ASA '325mg'$ . switched to Plavix for stroke prevention  Ongoing aggressive stroke risk factor management  Therapy recommendations:  CIR  Disposition:  pending   Cocaine abuse  UDS positive for cocaine  Pt admitted cocaine use  Cessation counseling provided  Pt is willing to quit  Hypertension  No hx Hypertension  Elevated during the night, as high as 182/77   Labetalol avoided given bradycardia in the 50s  Permissive hypertension (OK if < 180/105) but gradually normalize in 5-7 days Long-term BP goal normotensive  Possible seizure  Inconsistent story from family vs. EMS  No further acitivity  EEG no seizures. Left-sided slowing  ? Lupus  Listed as PMH  Will need further clarification from family  Autoimmune work up, lupus anticoagulant so far negative   TEE Monday to rule out marantic endocarditis. So far lupus CNS vasculitis less likely.   LE  venous doppler negative for DVT.   Hyperlipidemia  Home meds:  No statin  LDL 102, goal < 70  Add lipitor '20mg'$   Continue statin at discharge  Tobacco abuse  Current smoker  Smoking cessation counseling will be provided  Nicotine patch   Other Stroke Risk Factors  Advanced age  Other Active Problems  R sciatica  Bradycardia, 52s - 55s  PLAN   TEE Monday. Cardiology notified. NPO order written.  Hospital day # 2  This patient is critically ill due to left MCA stroke s/p tPA, aphasia, dysarthria, ? lupus and at significant risk of neurological worsening, death form recurrent stroke, hemorrhagic transformation, heart failure, brain herniation. This patient's care requires constant monitoring of vital signs, hemodynamics, respiratory and cardiac monitoring, review of multiple databases, neurological assessment, discussion with family, other specialists and medical decision  making of high complexity. I spent 35 minutes of neurocritical care time in the care of this patient.  Rosalin Hawking, MD PhD Stroke Neurology 07/22/2016 5:29 PM    To contact Stroke Continuity provider, please refer to [email or web address] After hours, contact General Neurology

## 2016-07-22 NOTE — Progress Notes (Signed)
Rehab admissions - I met briefly with patient at the bedside and left rehab booklets at the bedside.  Patient is aphasic and has difficulty communicating with me.  She did respond yes or no to my questions.  I tried to call significant other, but no answer.  I will open the case with [employer] to request acute inpatient rehab admission.  I will follow up again on Monday.  Call me for questions.  #[phone]

## 2016-07-22 NOTE — Progress Notes (Signed)
Physical Therapy Treatment Patient Details Name: Andrea Kennedy MRN: [id number] DOB: 22-Mar-1946 Today's Date: 07/22/2016    History of Present Illness Patient is a 70 y/o female with hx of R THA, tobacco abuse and lupus presents with right-sided weakness and aphasia, which started with jerking on the left side, urinary incontinence. MRI-Acute moderate nonhemorrhagic LEFT frontal lobe infarct. s/p tPA; + cocaine.  NIHSS of 15 upon arrival.     Patient progressing well towards PT goals. Encouraged continued use of RUE during functional tasks. Demonstrates some right inattention during activity. Also with right knee instability but no buckling during gait training. Able to comprehend 50% of words but otherwise unintelligible. Great CIR candidate. Will follow acutely.   Follow Up Recommendations  CIR     Equipment Recommendations  Other (comment)    Recommendations for Other Services       Precautions / Restrictions Precautions Precautions: Fall Precaution Comments: right hemiparesis and aphasia Restrictions Weight Bearing Restrictions: No    Mobility  Bed Mobility Overal bed mobility: Needs Assistance Bed Mobility: Supine to Sit;Sit to Supine     Supine to sit: Supervision;HOB elevated Sit to supine: Supervision   General bed mobility comments: Difficulty managing RUE.  Increased effort to bring LEs into bed.   Transfers Overall transfer level: Needs assistance Equipment used: None Transfers: Sit to/from Stand Sit to Stand: Min guard         General transfer comment: Min guard for safety. Stood from [employer], from low toilet x1 using grab bar for assist.   Ambulation/Gait Ambulation/Gait assistance: Min assist Ambulation Distance (Feet): 50 Feet Assistive device: 1 person hand held assist Gait Pattern/deviations: Step-through pattern;Decreased stride length Gait velocity: decreased   General Gait Details: Slow, unsteady gait with right knee instability but no  buckling.    Stairs            Wheelchair Mobility    Modified Rankin (Stroke Patients Only) Modified Rankin (Stroke Patients Only) Pre-Morbid Rankin Score: No symptoms Modified Rankin: Moderately severe disability     Balance Overall balance assessment: Needs assistance Sitting-balance support: Feet supported;No upper extremity supported Sitting balance-Leahy Scale: Good     Standing balance support: During functional activity Standing balance-Leahy Scale: Fair                      Cognition Arousal/Alertness: Awake/alert Behavior During Therapy: Impulsive (at times) Overall Cognitive Status: Difficult to assess Area of Impairment: Awareness       Following Commands: Follows multi-step commands consistently   Awareness: Emergent   General Comments: Some right inattention    Exercises      General Comments        Pertinent Vitals/Pain Pain Assessment: Faces Faces Pain Scale: No hurt    Home Living                      Prior Function            PT Goals (current goals can now be found in the care plan section) Progress towards PT goals: Progressing toward goals    Frequency    Min 4X/week      PT Plan Current plan remains appropriate    Co-evaluation             End of Session Equipment Utilized During Treatment: Gait belt Activity Tolerance: Patient tolerated treatment well Patient left: in bed;with call bell/phone within reach;with bed alarm set     Time: [phone]  PT Time Calculation (min) (ACUTE ONLY): 21 min  Charges:  $Gait Training: 8-22 mins                    G Codes:      Charlsie Fleeger A Aizen Duval 07/22/2016, 3:57 PM Wray Kearns, [place], DPT [phone]

## 2016-07-23 DIAGNOSIS — I480 Paroxysmal atrial fibrillation: Secondary | ICD-10-CM

## 2016-07-23 DIAGNOSIS — E785 Hyperlipidemia, unspecified: Secondary | ICD-10-CM

## 2016-07-23 DIAGNOSIS — F1410 Cocaine abuse, uncomplicated: Secondary | ICD-10-CM

## 2016-07-23 DIAGNOSIS — F1490 Cocaine use, unspecified, uncomplicated: Secondary | ICD-10-CM

## 2016-07-23 DIAGNOSIS — I638 Other cerebral infarction: Secondary | ICD-10-CM

## 2016-07-23 LAB — CBC
HEMATOCRIT: 38.3 % (ref 36.0–46.0)
Hemoglobin: 12.7 g/dL (ref 12.0–15.0)
MCH: 33.2 pg (ref 26.0–34.0)
MCHC: 33.2 g/dL (ref 30.0–36.0)
MCV: 100 fL (ref 78.0–100.0)
Platelets: 267 10*3/uL (ref 150–400)
RBC: 3.83 MIL/uL — ABNORMAL LOW (ref 3.87–5.11)
RDW: 14.5 % (ref 11.5–15.5)
WBC: 5.4 10*3/uL (ref 4.0–10.5)

## 2016-07-23 LAB — BASIC METABOLIC PANEL
ANION GAP: 7 (ref 5–15)
BUN: 11 mg/dL (ref 6–20)
CALCIUM: 9.3 mg/dL (ref 8.9–10.3)
CO2: 26 mmol/L (ref 22–32)
Chloride: 105 mmol/L (ref 101–111)
Creatinine, Ser: 0.87 mg/dL (ref 0.44–1.00)
GFR calc Af Amer: >60 mL/min (ref >60)
GFR calc non Af Amer: >60 mL/min (ref >60)
GLUCOSE: 113 mg/dL — AB (ref 65–99)
POTASSIUM: 4 mmol/L (ref 3.5–5.1)
Sodium: 138 mmol/L (ref 135–145)

## 2016-07-23 MED ORDER — APIXABAN 5 MG PO TABS
5.0000 mg | ORAL_TABLET | Freq: Two times a day (BID) | ORAL | Status: DC
  Administered 2016-07-23 – 2016-07-25 (×4): 5 mg via ORAL
  Filled 2016-07-23 (×4): qty 1

## 2016-07-23 MED ORDER — METOPROLOL TARTRATE 25 MG PO TABS
25.0000 mg | ORAL_TABLET | Freq: Two times a day (BID) | ORAL | Status: DC
  Administered 2016-07-23 (×2): 25 mg via ORAL
  Filled 2016-07-23 (×2): qty 1

## 2016-07-23 MED ORDER — ENOXAPARIN SODIUM 60 MG/0.6ML ~~LOC~~ SOLN
1.0000 mg/kg | Freq: Two times a day (BID) | SUBCUTANEOUS | Status: DC
  Administered 2016-07-23: 60 mg via SUBCUTANEOUS
  Filled 2016-07-23: qty 0.6

## 2016-07-23 MED ORDER — METOPROLOL TARTRATE 12.5 MG HALF TABLET
12.5000 mg | ORAL_TABLET | Freq: Two times a day (BID) | ORAL | Status: DC
  Administered 2016-07-23 – 2016-07-24 (×2): 12.5 mg via ORAL
  Filled 2016-07-23 (×2): qty 1

## 2016-07-23 NOTE — Clinical Social Work Note (Signed)
Clinical Social Work Assessment  Patient Details  Name: Andrea Kennedy MRN: [id number] Date of Birth: 03-08-1946  Date of referral:  07/23/16               Reason for consult:  Facility Placement                Permission sought to share information with:  Facility Sport and exercise psychologist, Family Supports Permission granted to share information::  Yes, Verbal Permission Granted  Name::     Stage manager::  SNFs  Relationship::  Daughter  Contact Information:  [phone]  Housing/Transportation Living arrangements for the past 2 months:  [place] of Information:  Adult Children Patient Interpreter Needed:  None Criminal Activity/Legal Involvement Pertinent to Current Situation/Hospitalization:  No - Comment as needed Significant Relationships:  Adult Children Lives with:  Spouse Do you feel safe going back to the place where you live?  No Need for family participation in patient care:  Yes (Comment)  Care giving concerns:  CSW received consult for possible SNF placement at time of discharge if CIR is unable to admit patient. Patient is difficult to understand due to medical needs. CSW Spoke with patient 's daughter regarding PT recommendation of SNF placement at time of discharge if CIR is unable to admit. Per patient's daughter, patient lives with her spouse and also cares for her spouse's mother. Patient is currently unable to care for herself at their home given patient's current physical needs and fall risk. Patient's daughter expressed understanding of PT recommendation and is agreeable to SNF placement at time of discharge in the event that CIR is unable to accept patient. CSW to continue to follow and assist with discharge planning needs.   Social Worker assessment / plan:  CSW spoke with patient's daughter concerning possibility of rehab at [facility] before returning home.  Employment status:  Retired Nurse, adult PT Recommendations:   [place], [place] / Referral to community resources:  [place]  Patient/Family's Response to care:  Patient's daughter recognizes need for rehab before returning home and is agreeable to a SNF in [facility], though they would prefer CIR.  Patient/Family's Understanding of and Emotional Response to Diagnosis, Current Treatment, and Prognosis:  Patient/family is realistic regarding therapy needs and expressed being hopeful for CIR placement. Patient's daughter expressed understanding of CSW role and discharge process. No questions/concerns about plan or treatment.    Emotional Assessment Appearance:  Appears stated age Attitude/Demeanor/Rapport:  Unable to Assess Affect (typically observed):  Unable to Assess (Patient difficult to understand) Orientation:  Oriented to Self, Oriented to Place, Oriented to  Time, Oriented to Situation Alcohol / Substance use:  Not Applicable Psych involvement (Current and /or in the community):  No (Comment)  Discharge Needs  Concerns to be addressed:  Care Coordination Readmission within the last 30 days:  No Current discharge risk:  None Barriers to Discharge:  Continued Medical Work up   [employer], [place] 07/23/2016, 2:16 PM

## 2016-07-23 NOTE — Significant Event (Signed)
S/O: Called to bedside for new onset of rapid irregular heart rate on telemetry. Appears most consistent with atrial fibrillation. Patient is asymptomatic.   BP 112/75 (BP Location: Left Arm)   Pulse 95   Temp 97.9 F (36.6 C) (Oral)   Resp 18   Ht '5\' 8"'$  (1.727 m)   Wt 61.3 kg (135 lb 1.6 oz)   SpO2 94%   BMI 20.54 kg/m   Exam consistent with known stroke this admission. Patient conversant and in NAD. Right facial droop noted.   A/R: - STAT EKG ordered for review.  - Starting metoprolol 25 mg po BID for rate contral - If EKG confirms atrial fibrillation, will start her on therapeutic Lovenox. In that event, stroke team to discuss options for long term anticoagulation with patient and husband in the AM.   Electronically signed: Dr. Kerney Elbe

## 2016-07-23 NOTE — Progress Notes (Signed)
Notified by CCMD of new onset of Afib. Pt asymptomatic and conversant. BP 112/75 Oxy sat 94% R 18 P.96 T 97.9. MD notified and stat EKG has been completed.

## 2016-07-23 NOTE — Significant Event (Signed)
S/O: Atrial fibrillation confirmed with EKG. Have switched prophylactic Lovenox to therapeutic dosing 1 mg/kg BID. Since anticoagulation is being started, Plavix has been discontinued.   A/R: 1. Stroke secondary to cocaine use vs marantic endocarditis vs intermittent atrial fibrillation.  2. Newly diagnosed atrial fibrillation. May be new-onset or previously undiagnosed intermittent atrial fibrillation. 3. Plavix has been stopped and prophylactic Lovenox has been switched to therapeutic dosing for secondary stroke prevention. Stroke team to discuss options for long term oral anticoagulation with patient and husband in the AM.  Electronically signed: Dr. Kerney Elbe

## 2016-07-23 NOTE — Consult Note (Addendum)
Admit date: 07/20/2016 Referring Physician  Dr. Erlinda Hong Primary Physician [name]. Primary Cardiologist  [place]) Reason for Consultation  stroke, atrial fibrillation  HPI: 70 year old female with stroke who recently on telemetry had rapid irregular heart rate that appears most consistent with atrial fibrillation. There was concerned previously about the possibility of endocarditis (cocaine use).  Transthoracic echocardiogram on 07/20/16 showed normal LV, normal EF. No wall motion abnormalities. MRI of brain showed acute left frontal lobe infarct.  Cocaine positive urine. TEE requested.  She was switched to Plavix for stroke prevention previously but now is on Lovenox.   Denies CP, SOB. Difficult discussing history with her.   PMH:   Past Medical History:  Diagnosis Date  . Lupus   . Sciatica of right side   . Tobacco abuse     PSH:   Past Surgical History:  Procedure Laterality Date  . TOTAL HIP ARTHROPLASTY Right 06/03/2015   Procedure: RIGHT TOTAL HIP ARTHROPLASTY ANTERIOR APPROACH;  Surgeon: Gaynelle Arabian, MD;  Location: [facility];  Service: Orthopedics;  Laterality: Right;  . TUBAL LIGATION     Allergies:  Floxin [ofloxacin] Prior to Admit Meds:   Prior to Admission medications   Medication Sig Start Date End Date Taking? Authorizing Provider  cyclobenzaprine (FLEXERIL) 5 MG tablet Take 1 tablet (5 mg total) by mouth 3 (three) times daily as needed for muscle spasms. 06/05/15  Yes Alexzandrew L Perkins, PA-C  acetaminophen (TYLENOL) 325 MG tablet Take 650 mg by mouth every 6 (six) hours as needed (for headache.).    Historical Provider, MD  diphenhydramine-acetaminophen (TYLENOL PM) 25-500 MG TABS tablet Take 1-2 tablets by mouth at bedtime as needed (for sleep).    Historical Provider, MD  docusate sodium (COLACE) 100 MG capsule Take 1 capsule (100 mg total) by mouth 2 (two) times daily. Patient not taking: Reported on 07/20/2016 06/05/15   Eugenie Filler, MD  enoxaparin (LOVENOX) 40 MG/0.4ML injection Inject 0.4 mLs (40 mg total) into the skin daily. Continue Lovenox injections for eight more days and then discontinue the Lovenox. Once the patient has completed the blood thinner regimen, then take a Baby 81 mg Aspirin daily for three more weeks. Patient not taking: Reported on 07/20/2016 06/05/15   Alexzandrew L Dara Lords, PA-C  meloxicam (MOBIC) 7.5 MG tablet Take 1 tablet (7.5 mg total) by mouth daily. Patient not taking: Reported on 07/20/2016 10/29/15   Willeen Niece, MD  Multiple Vitamins-Minerals (ALIVE WOMENS 50+ PO) Take 1 tablet by mouth daily.    Historical Provider, MD  nicotine (NICODERM CQ - DOSED IN MG/24 HOURS) 21 mg/24hr patch Place 1 patch (21 mg total) onto the skin daily. Patient not taking: Reported on 07/20/2016 06/05/15   Eugenie Filler, MD  oxyCODONE (OXY IR/ROXICODONE) 5 MG immediate release tablet Take 1-2 tablets (5-10 mg total) by mouth every 4 (four) hours as needed for moderate pain or severe pain ((for MODERATE breakthrough pain)). Patient not taking: Reported on 07/20/2016 06/05/15   Alexzandrew L Perkins, PA-C  traMADol (ULTRAM) 50 MG tablet Take 1-2 tablets (50-100 mg total) by mouth every 6 (six) hours as needed (mild pain). Patient not taking: Reported on 07/20/2016 06/05/15   Alexzandrew L Perkins, PA-C    Scheduled Meds: . atorvastatin  20 mg Oral q1800  . docusate sodium  100 mg Oral BID  . enoxaparin (LOVENOX) injection  1 mg/kg Subcutaneous Q12H  . hydrALAZINE  2 mg Intravenous Once  .  lisinopril  20 mg Oral Daily  . mouth rinse  15 mL Mouth Rinse BID  . methocarbamol  500 mg Oral TID  . metoprolol tartrate  25 mg Oral BID  . multivitamin with minerals  1 tablet Oral Daily  . nicotine  21 mg Transdermal Daily  . pantoprazole  40 mg Oral Daily  . vitamin B-12  1,000 mcg Oral Daily   Continuous Infusions: . sodium chloride 40 mL/hr at 07/22/16 1200   PRN Meds:.acetaminophen, cyclobenzaprine,  fentaNYL (SUBLIMAZE) injection, Influenza vac split quadrivalent PF, senna-docusate, traMADol, zolpidem  Fam HX:    Family History  Problem Relation Age of Onset  . Breast cancer Mother   . Diabetes Mother   . Heart attack Father   . Fibromyalgia Sister    Social HX:    Social History   Social History  . Marital status: Married    Spouse name: N/A  . Number of children: N/A  . Years of education: N/A   Occupational History  . Not on file.   Social History Main Topics  . Smoking status: Former Smoker    Quit date: 07/01/2015  . Smokeless tobacco: Never Used  . Alcohol use No  . Drug use: No  . Sexual activity: Not on file   Other Topics Concern  . Not on file   Social History Narrative  . No narrative on file     ROS:  Difficult to obtain clear history given expressive aphasia. All 11 ROS were addressed and are negative except what is stated in the HPI   Physical Exam: Blood pressure 118/66, pulse 73, temperature 97.9 F (36.6 C), temperature source Oral, resp. rate 18, height '5\' 8"'$  (1.727 m), weight 135 lb 1.6 oz (61.3 kg), SpO2 97 %.   General: Thin, in no acute distress Head: Eyes PERRLA, No xanthomas.   Normal cephalic and atramatic  Lungs:   Clear bilaterally to auscultation and percussion. Normal respiratory effort. No wheezes, no rales. Heart:  Irreg irreg normal rate S1 S2 Pulses are 2+ & equal. No murmur, rubs, gallops.  No carotid bruit. No JVD.  No abdominal bruits.  Abdomen: Bowel sounds are positive, abdomen soft and non-tender without masses. No hepatosplenomegaly. Msk:  Back normal. Normal strength and tone for age. Extremities:  No clubbing, cyanosis or edema.  DP +1 Neuro: Expressive dysarthria (trouble speaking, garbled at times) GU: Deferred Rectal: Deferred Psych:  responds appropriately      Labs: Lab Results  Component Value Date   WBC 5.4 07/23/2016   HGB 12.7 07/23/2016   HCT 38.3 07/23/2016   MCV 100.0 07/23/2016   PLT 267  07/23/2016     Recent Labs Lab 07/20/16 0235  07/23/16 0221  NA 138  < > 138  K 3.8  < > 4.0  CL 106  < > 105  CO2 23  < > 26  BUN 18  < > 11  CREATININE 1.10*  < > 0.87  CALCIUM 9.5  < > 9.3  PROT 6.6  --   --   BILITOT 0.8  --   --   ALKPHOS 48  --   --   ALT 12*  --   --   AST 24  --   --   GLUCOSE 113*  < > 113*  < > = values in this interval not displayed. No results for input(s): CKTOTAL, CKMB, TROPONINI in the last 72 hours. Lab Results  Component Value Date   CHOL 181  07/20/2016   HDL 62 07/20/2016   LDLCALC 102 (H) 07/20/2016   TRIG 85 07/20/2016   No results found for: [facility]   Radiology:  No results found. Personally viewed.  EKG: 07/23/16-2 AM-does appear to be atrial fibrillation.  Personally viewed.   ASSESSMENT/PLAN:    Atrial fibrillation  - EKG on 07/23/16 at 2:06 AM is atrial fibrillation  - Given age greater than 23, female, stroke. Score is 4.  - Recommend anticoagulation - NOAC (Eliquis '5mg'$  BID). I will defer to neurology on timing of initiation. She may be at risk for hemorrhagic transformation.  - Agree with stopping Plavix.  - Continue metoprolol 25 mg twice a day for rate control.  - I do not feel strongly that transesophageal echocardiogram needs to be performed since we are going to anticoagulate. It would not change management. I would recommend cancelling TEE.    Cocaine use  - Encourage cessation  Stroke  - Per primary team.  -  Lupus  - history. No longer on prednisone.   Will sign off. Please let us know if we can be of further assistance.   Candee Furbish, MD  07/23/2016  1:16 PM

## 2016-07-23 NOTE — Progress Notes (Addendum)
STROKE TEAM PROGRESS NOTE   SUBJECTIVE (INTERVAL HISTORY) Friends are at pt beside today. Pt was found to have afib over night. Was put on lovenox therapeutic dose. Cardiology consult made and confirmed afib.   OBJECTIVE Temp:  [97.8 F (36.6 C)-98.4 F (36.9 C)] 97.8 F (36.6 C) (12/09 0612) Pulse Rate:  [47-95] 50 (12/09 0612) Cardiac Rhythm: Atrial fibrillation;Supraventricular tachycardia (12/09 0053) Resp:  [12-19] 16 (12/09 0612) BP: (105-141)/(72-84) 105/76 (12/09 0612) SpO2:  [92 %-98 %] 96 % (12/09 0612) Weight:  [61.3 kg (135 lb 1.6 oz)] 61.3 kg (135 lb 1.6 oz) (12/08 1213)  CBC:   Recent Labs Lab 07/20/16 0235  07/22/16 0317 07/23/16 0221  WBC 7.3  < > 4.9 5.4  NEUTROABS 4.8  --   --   --   HGB 12.4  < > 11.8* 12.7  HCT 37.9  < > 36.5 38.3  MCV 101.3*  < > 101.1* 100.0  PLT 270  < > 265 267  < > = values in this interval not displayed.  Basic Metabolic Panel:   Recent Labs Lab 07/22/16 0317 07/23/16 0221  NA 141 138  K 4.2 4.0  CL 104 105  CO2 29 26  GLUCOSE 104* 113*  BUN 7 11  CREATININE 0.95 0.87  CALCIUM 9.3 9.3    Lipid Panel:     Component Value Date/Time   CHOL 181 07/20/2016 0440   TRIG 85 07/20/2016 0440   HDL 62 07/20/2016 0440   CHOLHDL 2.9 07/20/2016 0440   VLDL 17 07/20/2016 0440   LDLCALC 102 (H) 07/20/2016 0440   HgbA1c:  Lab Results  Component Value Date   HGBA1C 5.2 07/20/2016   Urine Drug Screen:     Component Value Date/Time   LABOPIA NONE DETECTED 07/20/2016 1503   COCAINSCRNUR POSITIVE (A) 07/20/2016 1503   LABBENZ NONE DETECTED 07/20/2016 1503   AMPHETMU NONE DETECTED 07/20/2016 1503   THCU NONE DETECTED 07/20/2016 1503   LABBARB NONE DETECTED 07/20/2016 1503      IMAGING I have personally reviewed the radiological images below and agree with the radiology interpretations.  Ct Head Code Stroke Wo Contrast` 07/20/2016 1. Small suspected acute LEFT posterior frontal lobe infarct. Moderate chronic small  vessel ischemic disease.  2. ASPECTS is 9.   Ct Head Wo Contrast 07/20/2016 Minimal propagation of LEFT frontal lobe acute infarct. No hemorrhagic conversion. Otherwise stable examination including moderate chronic small vessel ischemic disease.  CTA NECK 07/20/2016 Moderate stenosis LEFT vertebral artery origin without hemodynamically significant stenosis by NASCET criteria.   CTA HEAD 07/20/2016 No emergent large vessel occlusion or high-grade stenosis. Intracranial atherosclerosis, most apparent within the posterior circulation resulting in moderate luminal irregularity.  MRI  07/21/2016 Acute moderate nonhemorrhagic LEFT frontal lobe infarct (predominantly MCA and minimal ACA territories). Mild to moderate chronic small vessel ischemic disease.  EEG  07/20/2016 This awake and asleep EEG is abnormal due to focal slowing over the left hemisphere.  TTE 07/20/2016 Left ventricle:  The cavity size was normal. Wall thickness was normal. Systolic function was normal. The estimated ejection fraction was in the range of 50% to 55%. Wall motion was normal; there were no regional wall motion abnormalities.  LE venous Doppler  07/21/2016 No evidence of deep vein thrombosis or baker's cysts bilaterally.   PHYSICAL EXAM  Temp:  [97.8 F (36.6 C)-98.4 F (36.9 C)] 97.8 F (36.6 C) (12/09 0612) Pulse Rate:  [47-95] 50 (12/09 0612) Resp:  [12-19] 16 (12/09 0612) BP: (105-141)/(72-84) 105/76 (  12/09 0612) SpO2:  [92 %-98 %] 96 % (12/09 0612) Weight:  [61.3 kg (135 lb 1.6 oz)] 61.3 kg (135 lb 1.6 oz) (12/08 1213)  General - Well nourished, well developed, in no apparent distress.  Ophthalmologic - Fundi not visualized due to noncooperation.  Cardiovascular - Regular rate and rhythm.  Mental Status -  Level of arousal and orientation to self were intact, however not able to answer other orientation questions due to aphasia. Language exam showed moderate dysarthria, able to follow  simple commands, expressive aphasia, able to repeat one simple sentence also dysarthric, but not able to name. No neglect  Cranial Nerves II - XII - II - blinking to visual threat bilaterally. III, IV, VI - Extraocular movements intact V - Facial sensation intact. VII - right facial droop VIII - Hearing & vestibular intact bilaterally. X - Palate elevates symmetrically, moderate dysarthria. XI - Chin turning & shoulder shrug intact bilaterally. XII - Tongue protrusion midline.  Motor Strength - The patient's strength was 5/5 LUE and LLE, RUE 4-/5 proximal and 0/5 distal and RLE 5-/5.  Bulk was normal and fasciculations were absent.   Motor Tone - Muscle tone was assessed at the neck and appendages and was normal  Reflexes - The patient's reflexes were 1+ in all extremities and she had no pathological reflexes.  Sensory - Light touch, temperature/pinprick were assessed and were symmetrical.    Coordination - not cooperative on exam.  Tremor was absent.  Gait and Station - walk with walker, mild hemiparetic gait on the right.   ASSESSMENT/PLAN [name] is a 70 y.o. female with history of lupus and tobacco abuse presenting with R hemiplegia and aphasia. She received IV t-PA 07/20/2016 at 0312.   Stroke:  Dominant left frontal infarct s/p tPA, likely secondary to newly diagnosed afib. UDS positive for cocaine though.   Resultant  R hemiparesis, aphasia, dysarthria  CTA head  No significant stenosis  CTA neck  Mod L VA stenosis, b/l siphon nonflow limiting athero  S/p tPA with neuro worsening - CT head L frontal infarct, no hemorrhage  MRI  left MCA frontal lobe infarct  2D Echo  EF 50-55 %. No source of embolus    EEG left-sided slowing. No seizure activity  LE venous doppler negative for DVT  EEG paroxysmal afib  LDL 102  HgbA1c 5.2  UDS positive for cocaine  Hypercoagulable and autoimmune work up so far negative  lovenox for VTE prophylaxis DIET DYS 3  Room service appropriate? Yes; Fluid consistency: Thin Diet NPO time specified Except for: Sips with Meds Diet NPO time specified Except for: Sips with Meds  aspirin 81 mg daily prior to admission, now on lovenox '60mg'$  bid. Will switch to eliquis '5mg'$  bid.  Ongoing aggressive stroke risk factor management  Therapy recommendations:  CIR/SNF  Disposition:  pending   Paroxysmal afib - newly diagnosed  Confirmed on EKG over night  Cardiology consulted and confirmed afib  Agree with metoprolol for rate control  Agree anticoagulation  Switch lovenox '60mg'$  bid to eliquis '5mg'$  bid.  Cocaine abuse  UDS positive for cocaine  Pt admitted cocaine use  Cessation counseling provided  Pt is willing to quit  Possible seizure  Inconsistent story from family vs. EMS  No further acitivity  EEG no seizures. Left-sided slowing  ? Lupus  Listed as PMH  She follows with [facility]ology at [facility]. Was on prednisone before but she stopped by herself  Autoimmune work up, lupus anticoagulant  so far negative   LE venous doppler negative for DVT.   Hyperlipidemia  Home meds:  No statin  LDL 102, goal < 70  Add lipitor '20mg'$   Continue statin at discharge  Tobacco abuse  Current smoker  Smoking cessation counseling will be provided  Nicotine patch   Other Stroke Risk Factors  Advanced age  Other Active Problems  R sciatica  Bradycardia, 40s - 54s -> improved    Hospital day # 3  Rosalin Hawking, MD PhD Stroke Neurology 07/23/2016 3:58 PM   To contact Stroke Continuity provider, please refer to [email or web address] After hours, contact General Neurology

## 2016-07-23 NOTE — NC FL2 (Signed)
[place] LEVEL OF CARE SCREENING TOOL     IDENTIFICATION  Patient Name: Andrea Kennedy Birthdate: 10/07/1945 Sex: female Admission Date (Current Location): 07/20/2016  [facility] and [place] Number:  Herbalist and Address:  [facility] [place], [place] [address] [place], [place] [address]      Provider Number: [id number]  Attending Physician Name and Address:  Rosalin Hawking, MD  Relative Name and Phone Number:  Beverlee Nims daughter, [phone]    Current Level of Care: Hospital Recommended Level of Care: [place] Prior Approval Number:    Date Approved/Denied:   PASRR Number: [id number]  Discharge Plan: SNF    Current Diagnoses: Patient Active Problem List   Diagnosis Date Noted  . Stroke ([place]) 07/20/2016  . Stroke (cerebrum) ([place]) 07/20/2016  . Alteration in neurological status   . Postoperative anemia due to acute blood loss 06/05/2015  . Hip fracture, right ([place])   . Fracture of hip, right, closed ([place]) 06/03/2015  . Fall 06/03/2015  . Leukocytosis 06/03/2015  . Lupus   . Tobacco abuse   . Sciatica of right side     Orientation RESPIRATION BLADDER Height & Weight     Self, Time, Situation, Place  Normal Continent Weight: 61.3 kg (135 lb 1.6 oz) Height:  '5\' 8"'$  (172.7 cm)  BEHAVIORAL SYMPTOMS/MOOD NEUROLOGICAL BOWEL NUTRITION STATUS      Continent Diet (Please see DC Summary)  AMBULATORY STATUS COMMUNICATION OF NEEDS Skin   Extensive Assist Verbally Normal                       Personal Care Assistance Level of Assistance  Bathing, Feeding, Dressing Bathing Assistance: Maximum assistance Feeding assistance: Limited assistance Dressing Assistance: Limited assistance     Functional Limitations Info  Speech     Speech Info: Impaired    SPECIAL CARE FACTORS FREQUENCY  PT (By licensed PT), OT (By licensed OT), Speech therapy     PT Frequency: 5x/week OT Frequency: 3x/week     Speech Therapy  Frequency: 3x/week      Contractures      Additional Factors Info  Code Status, Allergies Code Status Info: Full Allergies Info: Floxin Ofloxacin           Current Medications (07/23/2016):  This is the current hospital active medication list Current Facility-Administered Medications  Medication Dose Route Frequency Provider Last Rate Last Dose  . 0.9 %  sodium chloride infusion   Intravenous Continuous Rosalin Hawking, MD 40 mL/hr at 07/22/16 1200    . acetaminophen (TYLENOL) tablet 650 mg  650 mg Oral Q6H PRN Kerney Elbe, MD      . atorvastatin (LIPITOR) tablet 20 mg  20 mg Oral q1800 Rosalin Hawking, MD   20 mg at 07/22/16 1809  . cyclobenzaprine (FLEXERIL) tablet 5 mg  5 mg Oral TID PRN Kerney Elbe, MD      . docusate sodium (COLACE) capsule 100 mg  100 mg Oral BID Kerney Elbe, MD   100 mg at 07/23/16 0919  . enoxaparin (LOVENOX) injection 60 mg  1 mg/kg Subcutaneous Q12H Kerney Elbe, MD   60 mg at 07/23/16 0919  . fentaNYL (SUBLIMAZE) injection 25-50 mcg  25-50 mcg Intravenous Q4H PRN Kerney Elbe, MD   25 mcg at 07/22/16 0506  . hydrALAZINE (APRESOLINE) injection 2 mg  2 mg Intravenous Once Kerney Elbe, MD      . Influenza vac split quadrivalent PF (FLUARIX) injection 0.5 mL  0.5 mL Intramuscular Prior to discharge Kerney Elbe, MD      . lisinopril (PRINIVIL,ZESTRIL) tablet 20 mg  20 mg Oral Daily Rosalin Hawking, MD   20 mg at 07/23/16 0919  . MEDLINE mouth rinse  15 mL Mouth Rinse BID Kerney Elbe, MD   15 mL at 07/23/16 1000  . methocarbamol (ROBAXIN) tablet 500 mg  500 mg Oral TID Kerney Elbe, MD   500 mg at 07/23/16 0919  . metoprolol tartrate (LOPRESSOR) tablet 25 mg  25 mg Oral BID Kerney Elbe, MD   25 mg at 07/23/16 0919  . multivitamin with minerals tablet 1 tablet  1 tablet Oral Daily Kerney Elbe, MD   1 tablet at 07/23/16 0919  . nicotine (NICODERM CQ - dosed in mg/24 hours) patch 21 mg  21 mg Transdermal Daily Kerney Elbe, MD   21 mg at 07/23/16 0916  . pantoprazole  (PROTONIX) EC tablet 40 mg  40 mg Oral Daily Rosalin Hawking, MD   40 mg at 07/23/16 0919  . senna-docusate (Senokot-S) tablet 1 tablet  1 tablet Oral QHS PRN Kerney Elbe, MD      . traMADol Veatrice Bourbon) tablet 50-100 mg  50-100 mg Oral Q6H PRN Kerney Elbe, MD   100 mg at 07/22/16 2113  . vitamin B-12 (CYANOCOBALAMIN) tablet 1,000 mcg  1,000 mcg Oral Daily Rosalin Hawking, MD   1,000 mcg at 07/23/16 0919  . zolpidem (AMBIEN) tablet 5 mg  5 mg Oral QHS PRN Kerney Elbe, MD         Discharge Medications: Please see discharge summary for a list of discharge medications.  Relevant Imaging Results:  Relevant Lab Results:   Additional Information SSN: [place], [place]

## 2016-07-24 LAB — BASIC METABOLIC PANEL WITH GFR
Anion gap: 6 (ref 5–15)
BUN: 10 mg/dL (ref 6–20)
CO2: 31 mmol/L (ref 22–32)
Calcium: 9 mg/dL (ref 8.9–10.3)
Chloride: 101 mmol/L (ref 101–111)
Creatinine, Ser: 1.01 mg/dL — ABNORMAL HIGH (ref 0.44–1.00)
GFR calc Af Amer: >60 mL/min
GFR calc non Af Amer: 56 mL/min — ABNORMAL LOW
Glucose, Bld: 108 mg/dL — ABNORMAL HIGH (ref 65–99)
Potassium: 3.7 mmol/L (ref 3.5–5.1)
Sodium: 138 mmol/L (ref 135–145)

## 2016-07-24 LAB — CBC
HEMATOCRIT: 37.9 % (ref 36.0–46.0)
HEMOGLOBIN: 12.4 g/dL (ref 12.0–15.0)
MCH: 32.7 pg (ref 26.0–34.0)
MCHC: 32.7 g/dL (ref 30.0–36.0)
MCV: 100 fL (ref 78.0–100.0)
PLATELETS: 300 10*3/uL (ref 150–400)
RBC: 3.79 MIL/uL — AB (ref 3.87–5.11)
RDW: 14.4 % (ref 11.5–15.5)
WBC: 6 10*3/uL (ref 4.0–10.5)

## 2016-07-24 LAB — CARDIOLIPIN ANTIBODIES, IGG, IGM, IGA
ANTICARDIOLIPIN IGM: 12 [MPL'U]/mL (ref 0–12)
Anticardiolipin IgG: <9 GPL U/mL (ref 0–14)

## 2016-07-24 NOTE — Progress Notes (Signed)
STROKE TEAM PROGRESS NOTE   SUBJECTIVE (INTERVAL HISTORY) No family is at pt beside today. Pt on eliquis without issue. No acute event overnight. Pending placement, CIR vs. SNF.    OBJECTIVE Temp:  [97.9 F (36.6 C)-98.9 F (37.2 C)] 98 F (36.7 C) (12/10 0610) Pulse Rate:  [54-85] 54 (12/10 0610) Cardiac Rhythm: Atrial fibrillation (12/10 0439) Resp:  [18] 18 (12/10 0610) BP: (107-141)/(64-81) 127/65 (12/10 0610) SpO2:  [96 %-100 %] 96 % (12/10 0610)  CBC:   Recent Labs Lab 07/20/16 0235  07/23/16 0221 07/24/16 0311  WBC 7.3  < > 5.4 6.0  NEUTROABS 4.8  --   --   --   HGB 12.4  < > 12.7 12.4  HCT 37.9  < > 38.3 37.9  MCV 101.3*  < > 100.0 100.0  PLT 270  < > 267 300  < > = values in this interval not displayed.  Basic Metabolic Panel:   Recent Labs Lab 07/23/16 0221 07/24/16 0311  NA 138 138  K 4.0 3.7  CL 105 101  CO2 26 31  GLUCOSE 113* 108*  BUN 11 10  CREATININE 0.87 1.01*  CALCIUM 9.3 9.0    Lipid Panel:     Component Value Date/Time   CHOL 181 07/20/2016 0440   TRIG 85 07/20/2016 0440   HDL 62 07/20/2016 0440   CHOLHDL 2.9 07/20/2016 0440   VLDL 17 07/20/2016 0440   LDLCALC 102 (H) 07/20/2016 0440   HgbA1c:  Lab Results  Component Value Date   HGBA1C 5.2 07/20/2016   Urine Drug Screen:     Component Value Date/Time   LABOPIA NONE DETECTED 07/20/2016 1503   COCAINSCRNUR POSITIVE (A) 07/20/2016 1503   LABBENZ NONE DETECTED 07/20/2016 1503   AMPHETMU NONE DETECTED 07/20/2016 1503   THCU NONE DETECTED 07/20/2016 1503   LABBARB NONE DETECTED 07/20/2016 1503      IMAGING I have personally reviewed the radiological images below and agree with the radiology interpretations.  Ct Head Code Stroke Wo Contrast` 07/20/2016 1. Small suspected acute LEFT posterior frontal lobe infarct. Moderate chronic small vessel ischemic disease.  2. ASPECTS is 9.   Ct Head Wo Contrast 07/20/2016 Minimal propagation of LEFT frontal lobe acute infarct. No  hemorrhagic conversion. Otherwise stable examination including moderate chronic small vessel ischemic disease.  CTA NECK 07/20/2016 Moderate stenosis LEFT vertebral artery origin without hemodynamically significant stenosis by NASCET criteria.   CTA HEAD 07/20/2016 No emergent large vessel occlusion or high-grade stenosis. Intracranial atherosclerosis, most apparent within the posterior circulation resulting in moderate luminal irregularity.  MRI  07/21/2016 Acute moderate nonhemorrhagic LEFT frontal lobe infarct (predominantly MCA and minimal ACA territories). Mild to moderate chronic small vessel ischemic disease.  EEG  07/20/2016 This awake and asleep EEG is abnormal due to focal slowing over the left hemisphere.  TTE 07/20/2016 Left ventricle:  The cavity size was normal. Wall thickness was normal. Systolic function was normal. The estimated ejection fraction was in the range of 50% to 55%. Wall motion was normal; there were no regional wall motion abnormalities.  LE venous Doppler  07/21/2016 No evidence of deep vein thrombosis or baker's cysts bilaterally.   PHYSICAL EXAM  Temp:  [97.9 F (36.6 C)-98.9 F (37.2 C)] 98 F (36.7 C) (12/10 0610) Pulse Rate:  [54-85] 54 (12/10 0610) Resp:  [18] 18 (12/10 0610) BP: (107-141)/(64-81) 127/65 (12/10 0610) SpO2:  [96 %-100 %] 96 % (12/10 0610)  General - Well nourished, well developed, in no apparent  distress.  Ophthalmologic - Fundi not visualized due to noncooperation.  Cardiovascular - Regular rate and rhythm.  Mental Status -  Level of arousal and orientation to self were intact, however not able to answer other orientation questions due to aphasia. Language exam showed moderate dysarthria, able to follow simple commands, expressive aphasia, able to repeat one simple sentence also dysarthric, but not able to name. No neglect  Cranial Nerves II - XII - II - blinking to visual threat bilaterally. III, IV, VI -  Extraocular movements intact V - Facial sensation intact. VII - right facial droop VIII - Hearing & vestibular intact bilaterally. X - Palate elevates symmetrically, moderate dysarthria. XI - Chin turning & shoulder shrug intact bilaterally. XII - Tongue protrusion midline.  Motor Strength - The patient's strength was 5/5 LUE and LLE, RUE 4-/5 proximal and 0/5 distal and RLE 5-/5.  Bulk was normal and fasciculations were absent.   Motor Tone - Muscle tone was assessed at the neck and appendages and was normal  Reflexes - The patient's reflexes were 1+ in all extremities and she had no pathological reflexes.  Sensory - Light touch, temperature/pinprick were assessed and were symmetrical.    Coordination - not cooperative on exam.  Tremor was absent.  Gait and Station - walk with walker, mild hemiparetic gait on the right.   ASSESSMENT/PLAN [name] is a 70 y.o. female with history of lupus and tobacco abuse presenting with R hemiplegia and aphasia. She received IV t-PA 07/20/2016 at 0312.   Stroke:  Dominant left frontal infarct s/p tPA, likely secondary to newly diagnosed afib. UDS positive for cocaine though.   Resultant  R hemiparesis, aphasia, dysarthria  CTA head  No significant stenosis  CTA neck  Mod L VA stenosis, b/l siphon nonflow limiting athero  S/p tPA with neuro worsening - CT head L frontal infarct, no hemorrhage  MRI  left MCA frontal lobe infarct  2D Echo  EF 50-55 %. No source of embolus    EEG left-sided slowing. No seizure activity  LE venous doppler negative for DVT  EEG paroxysmal afib  LDL 102  HgbA1c 5.2  UDS positive for cocaine  Hypercoagulable and autoimmune work up so far negative  lovenox for VTE prophylaxis DIET DYS 3 Room service appropriate? Yes; Fluid consistency: Thin  aspirin 81 mg daily prior to admission, now on eliquis '5mg'$  bid.  Ongoing aggressive stroke risk factor management  Therapy recommendations:  CIR vs.  SNF  Disposition:  pending   Paroxysmal afib - newly diagnosed  Confirmed on EKG over night  Cardiology consulted and confirmed afib  Was put on metoprolol for rate control, but discontinued due to low BP and bradycardia  On eliquis '5mg'$  bid. Continue on discharge.  Cocaine abuse  UDS positive for cocaine  Pt admitted cocaine use  Cessation counseling provided  Pt is willing to quit  Possible seizure  Inconsistent story from family vs. EMS  No further acitivity  EEG no seizures. Left-sided slowing  ? Lupus  Listed as PMH  She follows with [facility]ology at [facility]. Was on prednisone before but she stopped by herself  Autoimmune work up, lupus anticoagulant so far negative   LE venous doppler negative for DVT.   Hyperlipidemia  Home meds:  No statin  LDL 102, goal < 70  Add lipitor '20mg'$   Continue statin at discharge  Tobacco abuse  Current smoker  Smoking cessation counseling will be provided  Nicotine patch   Other  Stroke Risk Factors  Advanced age  Other Active Problems  R sciatica  Bradycardia, 40s - 50s -> improved   Hospital day # 4  Rosalin Hawking, MD PhD Stroke Neurology 07/24/2016 1:41 PM   To contact Stroke Continuity provider, please refer to [email or web address] After hours, contact General Neurology

## 2016-07-24 NOTE — Progress Notes (Signed)
Per Cardiology - TEE scheduled for Monday at 3:00 pm. Please let Trish know if we also want loop.  Mikey Bussing PA-C [facility] Pager [phone] 07/24/2016, 3:05 PM\

## 2016-07-25 ENCOUNTER — Inpatient Hospital Stay (HOSPITAL_COMMUNITY)
Admission: RE | Admit: 2016-07-25 | Discharge: 2016-08-03 | DRG: 092 | Disposition: A | Discharge status: Home / Self Care | Payer: Medicare HMO | Source: Intra-hospital | Attending: Physical Medicine & Rehabilitation | Admitting: Physical Medicine & Rehabilitation

## 2016-07-25 ENCOUNTER — Other Ambulatory Visit (HOSPITAL_COMMUNITY): Payer: Medicare HMO

## 2016-07-25 ENCOUNTER — Encounter (HOSPITAL_COMMUNITY)
Admission: EM | Disposition: A | Discharge status: Rehabilitation Facility | Payer: Self-pay | Source: Home / Self Care | Attending: Neurology

## 2016-07-25 DIAGNOSIS — Z720 Tobacco use: Secondary | ICD-10-CM

## 2016-07-25 DIAGNOSIS — D539 Nutritional anemia, unspecified: Secondary | ICD-10-CM | POA: Diagnosis present

## 2016-07-25 DIAGNOSIS — N179 Acute kidney failure, unspecified: Secondary | ICD-10-CM

## 2016-07-25 DIAGNOSIS — I69322 Dysarthria following cerebral infarction: Secondary | ICD-10-CM

## 2016-07-25 DIAGNOSIS — Z23 Encounter for immunization: Secondary | ICD-10-CM | POA: Diagnosis not present

## 2016-07-25 DIAGNOSIS — E785 Hyperlipidemia, unspecified: Secondary | ICD-10-CM | POA: Diagnosis present

## 2016-07-25 DIAGNOSIS — M329 Systemic lupus erythematosus, unspecified: Secondary | ICD-10-CM | POA: Diagnosis present

## 2016-07-25 DIAGNOSIS — R2689 Other abnormalities of gait and mobility: Principal | ICD-10-CM | POA: Diagnosis present

## 2016-07-25 DIAGNOSIS — G8191 Hemiplegia, unspecified affecting right dominant side: Secondary | ICD-10-CM

## 2016-07-25 DIAGNOSIS — R252 Cramp and spasm: Secondary | ICD-10-CM

## 2016-07-25 DIAGNOSIS — I4891 Unspecified atrial fibrillation: Secondary | ICD-10-CM | POA: Diagnosis present

## 2016-07-25 DIAGNOSIS — F1410 Cocaine abuse, uncomplicated: Secondary | ICD-10-CM | POA: Diagnosis present

## 2016-07-25 DIAGNOSIS — R29818 Other symptoms and signs involving the nervous system: Secondary | ICD-10-CM

## 2016-07-25 DIAGNOSIS — G8190 Hemiplegia, unspecified affecting unspecified side: Secondary | ICD-10-CM

## 2016-07-25 DIAGNOSIS — I480 Paroxysmal atrial fibrillation: Secondary | ICD-10-CM

## 2016-07-25 DIAGNOSIS — G8194 Hemiplegia, unspecified affecting left nondominant side: Secondary | ICD-10-CM | POA: Diagnosis not present

## 2016-07-25 DIAGNOSIS — I10 Essential (primary) hypertension: Secondary | ICD-10-CM | POA: Diagnosis present

## 2016-07-25 DIAGNOSIS — I63032 Cerebral infarction due to thrombosis of left carotid artery: Secondary | ICD-10-CM

## 2016-07-25 DIAGNOSIS — I69320 Aphasia following cerebral infarction: Secondary | ICD-10-CM

## 2016-07-25 DIAGNOSIS — R269 Unspecified abnormalities of gait and mobility: Secondary | ICD-10-CM | POA: Diagnosis not present

## 2016-07-25 DIAGNOSIS — E7800 Pure hypercholesterolemia, unspecified: Secondary | ICD-10-CM

## 2016-07-25 DIAGNOSIS — R040 Epistaxis: Secondary | ICD-10-CM | POA: Diagnosis present

## 2016-07-25 DIAGNOSIS — R29810 Facial weakness: Secondary | ICD-10-CM | POA: Diagnosis present

## 2016-07-25 DIAGNOSIS — I69922 Dysarthria following unspecified cerebrovascular disease: Secondary | ICD-10-CM

## 2016-07-25 DIAGNOSIS — I6340 Cerebral infarction due to embolism of unspecified cerebral artery: Principal | ICD-10-CM

## 2016-07-25 DIAGNOSIS — I69351 Hemiplegia and hemiparesis following cerebral infarction affecting right dominant side: Secondary | ICD-10-CM

## 2016-07-25 DIAGNOSIS — F17200 Nicotine dependence, unspecified, uncomplicated: Secondary | ICD-10-CM

## 2016-07-25 DIAGNOSIS — I69398 Other sequelae of cerebral infarction: Secondary | ICD-10-CM | POA: Diagnosis not present

## 2016-07-25 DIAGNOSIS — R41844 Frontal lobe and executive function deficit: Secondary | ICD-10-CM | POA: Diagnosis not present

## 2016-07-25 DIAGNOSIS — I63412 Cerebral infarction due to embolism of left middle cerebral artery: Secondary | ICD-10-CM

## 2016-07-25 SURGERY — ECHOCARDIOGRAM, TRANSESOPHAGEAL
Anesthesia: Moderate Sedation

## 2016-07-25 MED ORDER — VITAMIN B-12 1000 MCG PO TABS
1000.0000 ug | ORAL_TABLET | Freq: Every day | ORAL | Status: DC
  Administered 2016-07-26 – 2016-08-03 (×9): 1000 ug via ORAL
  Filled 2016-07-25 (×9): qty 1

## 2016-07-25 MED ORDER — PANTOPRAZOLE SODIUM 40 MG PO TBEC
40.0000 mg | DELAYED_RELEASE_TABLET | Freq: Every day | ORAL | Status: DC
  Administered 2016-07-26 – 2016-08-03 (×9): 40 mg via ORAL
  Filled 2016-07-25 (×9): qty 1

## 2016-07-25 MED ORDER — SENNOSIDES-DOCUSATE SODIUM 8.6-50 MG PO TABS: 1.0000 | ORAL_TABLET | Freq: Every evening | ORAL | Status: AC | PRN

## 2016-07-25 MED ORDER — METHOCARBAMOL 500 MG PO TABS
500.0000 mg | ORAL_TABLET | Freq: Four times a day (QID) | ORAL | Status: DC | PRN
Start: 2016-07-25 — End: 2016-08-03
  Administered 2016-07-28 – 2016-08-03 (×5): 500 mg via ORAL
  Filled 2016-07-25 (×5): qty 1

## 2016-07-25 MED ORDER — LISINOPRIL 20 MG PO TABS
20.0000 mg | ORAL_TABLET | Freq: Every day | ORAL | Status: DC
  Administered 2016-07-26 – 2016-07-29 (×4): 20 mg via ORAL
  Filled 2016-07-25 (×6): qty 1

## 2016-07-25 MED ORDER — APIXABAN 5 MG PO TABS
5.0000 mg | ORAL_TABLET | Freq: Two times a day (BID) | ORAL | Status: DC
  Administered 2016-07-25 – 2016-08-03 (×18): 5 mg via ORAL
  Filled 2016-07-25 (×18): qty 1

## 2016-07-25 MED ORDER — ACETAMINOPHEN 325 MG PO TABS
650.0000 mg | ORAL_TABLET | Freq: Four times a day (QID) | ORAL | Status: DC | PRN
  Administered 2016-07-26 – 2016-08-01 (×4): 650 mg via ORAL
  Filled 2016-07-25 (×5): qty 2

## 2016-07-25 MED ORDER — ADULT MULTIVITAMIN W/MINERALS CH
1.0000 | ORAL_TABLET | Freq: Every day | ORAL | Status: DC
  Administered 2016-07-26 – 2016-08-03 (×9): 1 via ORAL
  Filled 2016-07-25 (×9): qty 1

## 2016-07-25 MED ORDER — INFLUENZA VAC SPLIT QUAD 0.5 ML IM SUSY
0.5000 mL | PREFILLED_SYRINGE | INTRAMUSCULAR | Status: AC
  Administered 2016-07-26: 0.5 mL via INTRAMUSCULAR
  Filled 2016-07-25: qty 0.5

## 2016-07-25 MED ORDER — TRAMADOL HCL 50 MG PO TABS
50.0000 mg | ORAL_TABLET | Freq: Four times a day (QID) | ORAL | Status: DC | PRN
  Administered 2016-07-25 – 2016-07-26 (×2): 100 mg via ORAL
  Administered 2016-07-27: 50 mg via ORAL
  Administered 2016-07-28 – 2016-07-30 (×3): 100 mg via ORAL
  Administered 2016-07-31: 50 mg via ORAL
  Administered 2016-08-01: 100 mg via ORAL
  Administered 2016-08-03: 50 mg via ORAL
  Filled 2016-07-25 (×3): qty 1
  Filled 2016-07-25: qty 2
  Filled 2016-07-25: qty 1
  Filled 2016-07-25 (×2): qty 2
  Filled 2016-07-25: qty 1
  Filled 2016-07-25 (×2): qty 2

## 2016-07-25 MED ORDER — ONDANSETRON HCL 4 MG/2ML IJ SOLN: 4.0000 mg | Freq: Four times a day (QID) | INTRAMUSCULAR | Status: DC | PRN

## 2016-07-25 MED ORDER — ONDANSETRON HCL 4 MG PO TABS: 4.0000 mg | ORAL_TABLET | Freq: Four times a day (QID) | ORAL | Status: DC | PRN

## 2016-07-25 MED ORDER — ATORVASTATIN CALCIUM 20 MG PO TABS
20.0000 mg | ORAL_TABLET | Freq: Every day | ORAL | Status: DC
  Administered 2016-07-25 – 2016-08-02 (×9): 20 mg via ORAL
  Filled 2016-07-25 (×9): qty 1

## 2016-07-25 MED ORDER — SORBITOL 70 % SOLN
30.0000 mL | Freq: Every day | Status: DC | PRN
  Administered 2016-07-28: 30 mL via ORAL
  Filled 2016-07-25: qty 30

## 2016-07-25 MED ORDER — DOCUSATE SODIUM 100 MG PO CAPS
100.0000 mg | ORAL_CAPSULE | Freq: Two times a day (BID) | ORAL | Status: DC
  Administered 2016-07-25 – 2016-08-03 (×18): 100 mg via ORAL
  Filled 2016-07-25 (×18): qty 1

## 2016-07-25 MED ORDER — NICOTINE 21 MG/24HR TD PT24
21.0000 mg | MEDICATED_PATCH | Freq: Every day | TRANSDERMAL | Status: DC
  Administered 2016-07-26 – 2016-07-31 (×6): 21 mg via TRANSDERMAL
  Filled 2016-07-25 (×6): qty 1

## 2016-07-25 NOTE — H&P (Signed)
Physical Medicine and Rehabilitation Admission H&P    Chief Complaint  Patient presents with  . Code Stroke  : HPI: Andrea Kennedy is a 70 y.o. right handed female with history of lupus, tobacco abuse, right total hip arthroplasty 06/03/2015 and was discharged to [facility] SN[facility]. Per chart review and patient, patient lives with spouse independent prior to admission. One level home with 3 steps to entry. Presented 07/20/2016 with right-sided weakness and aphasia.Urine drug screen positive for cocaine. CT/MRI of the head showed acute moderate nonhemorrhagic left frontal lobe infarct. Patient did receive TPA.. CTA of head and neck with no emergent large vessel occlusion or stenosis. EEG showed focal cerebral dysfunction over the left hemisphere suggestive of underlying structural or physiologic abnormality. No seizure activity noted. Venous Dopplers lower extremities negative for DVT.Echocardiogram with ejection fraction of 55%. Systolic function was normal. Neurology consulted maintained on Plavix and aspirin 325 mg daily for CVA prophylaxis. Subcutaneous Lovenox for DVT prophylaxis. Patient maintained on telemetry showing rapid irregular heart rate that appeared consistent with atrial fibrillation. Cardiology service is consulted advised Eliquis and aspirin/Plavix as well as Lovenox were discontinued at that time. Tolerating a mechanical soft diet. Patient with ongoing bouts Elevated blood pressure and received intravenous hydralazine.Consideration was made for TEE however it was canceled by as neurology services did not feel any further workup is needed. Physical and occupational therapy evaluations completed with recommendations of physical medicine rehabilitation consult.Patient was admitted for comprehensive rehabilitation program  Review of Systems  Constitutional: Negative for chills and fever.  HENT: Negative for tinnitus.   Eyes: Negative for double vision.  Neurological: Positive for speech  change and focal weakness. Negative for tingling, sensory change and headaches.  All other systems reviewed and are negative.  Past Medical History:  Diagnosis Date  . Lupus   . Sciatica of right side   . Tobacco abuse    Past Surgical History:  Procedure Laterality Date  . TOTAL HIP ARTHROPLASTY Right 06/03/2015   Procedure: RIGHT TOTAL HIP ARTHROPLASTY ANTERIOR APPROACH;  Surgeon: Gaynelle Arabian, MD;  Location: [facility];  Service: Orthopedics;  Laterality: Right;  . TUBAL LIGATION     Family History  Problem Relation Age of Onset  . Breast cancer Mother   . Diabetes Mother   . Heart attack Father   . Fibromyalgia Sister    Social History:  reports that she quit smoking about 12 months ago. She has never used smokeless tobacco. She reports that she does not drink alcohol or use drugs. Allergies:  Allergies  Allergen Reactions  . Floxin [Ofloxacin] Other (See Comments)    Scared/shaky/pain attacks/confusion   Medications Prior to Admission  Medication Sig Dispense Refill  . cyclobenzaprine (FLEXERIL) 5 MG tablet Take 1 tablet (5 mg total) by mouth 3 (three) times daily as needed for muscle spasms. 90 tablet 0  . acetaminophen (TYLENOL) 325 MG tablet Take 650 mg by mouth every 6 (six) hours as needed (for headache.).    Marland Kitchen diphenhydramine-acetaminophen (TYLENOL PM) 25-500 MG TABS tablet Take 1-2 tablets by mouth at bedtime as needed (for sleep).    . docusate sodium (COLACE) 100 MG capsule Take 1 capsule (100 mg total) by mouth 2 (two) times daily. (Patient not taking: Reported on 07/20/2016) 10 capsule 0  . enoxaparin (LOVENOX) 40 MG/0.4ML injection Inject 0.4 mLs (40 mg total) into the skin daily. Continue Lovenox injections for eight more days and then discontinue the Lovenox. Once the patient has completed the blood thinner regimen,  then take a Baby 81 mg Aspirin daily for three more weeks. (Patient not taking: Reported on 07/20/2016) 8 Syringe 0  . meloxicam (MOBIC) 7.5 MG tablet  Take 1 tablet (7.5 mg total) by mouth daily. (Patient not taking: Reported on 07/20/2016) 30 tablet 0  . Multiple Vitamins-Minerals (ALIVE WOMENS 50+ PO) Take 1 tablet by mouth daily.    . nicotine (NICODERM CQ - DOSED IN MG/24 HOURS) 21 mg/24hr patch Place 1 patch (21 mg total) onto the skin daily. (Patient not taking: Reported on 07/20/2016) 28 patch 0  . oxyCODONE (OXY IR/ROXICODONE) 5 MG immediate release tablet Take 1-2 tablets (5-10 mg total) by mouth every 4 (four) hours as needed for moderate pain or severe pain ((for MODERATE breakthrough pain)). (Patient not taking: Reported on 07/20/2016) 90 tablet 0  . traMADol (ULTRAM) 50 MG tablet Take 1-2 tablets (50-100 mg total) by mouth every 6 (six) hours as needed (mild pain). 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  Lives With: Spouse   Functional History: Prior Function Level of Independence: Independent Comments: Drives, cooks, cleans, caregiver  Functional Status:  Mobility: Bed Mobility Overal bed mobility: Needs Assistance Bed Mobility: Sit to Supine Supine to sit: Supervision, HOB elevated Sit to supine: Min assist General bed mobility comments: Difficulty managin RUE in bed. Good trunk control Transfers Overall transfer level: Needs assistance Equipment used: None Transfers: Sit to/from Stand, Stand Pivot Transfers Sit to Stand: Min assist Stand pivot transfers: Min assist General transfer comment: Assist to power to standing and for SPT  x2 from bed to [facility] and from [facility] to chair. RIght knee instability noted but no buckling. Ambulation/Gait Ambulation/Gait assistance: Min assist Ambulation Distance (Feet): 4 Feet Assistive device: 1 person hand held assist Gait Pattern/deviations: Step-to pattern, Decreased stride length General Gait Details: Guarded gait- Able to take a few steps in room but reports dizziness so needed to sit. Right knee instability but no buckling. Gait velocity: decreased Gait velocity interpretation: Below normal speed for age/gender    ADL: ADL Overall ADL's : Needs assistance/impaired Eating/Feeding: Minimal assistance Eating/Feeding Details (indicate cue type and reason): difficulty manipulating utensil with L hand. Difficulty scooping food onto fork. Minimal pocketing R cheek, but clearing it independently Grooming: Moderate assistance Upper Body Bathing: Minimal assistance, Bed level Lower Body Bathing: Minimal assistance, Sit to/from stand Upper Body Dressing : Moderate assistance, Sitting Lower Body Dressing: Moderate assistance, Sit to/from stand Toilet Transfer: Minimal assistance, Stand-pivot Toileting- Clothing Manipulation and Hygiene: Minimal assistance, Sit to/from stand Functional mobility during ADLs: Minimal assistance General ADL Comments: Very motivated to complete her own self care. Poor awraeness of RUE during tasks  Cognition: Cognition Overall Cognitive Status: Difficult to assess Arousal/Alertness: Awake/alert Orientation Level: Oriented X4 Attention: Sustained Sustained Attention: Impaired Sustained Attention Impairment: Verbal basic Memory:  (will assess further in therapy) Awareness: Appears intact Problem Solving: Appears intact Behaviors: Lability Safety/Judgment: Appears intact Cognition Arousal/Alertness: Awake/alert Behavior During Therapy: Impulsive (at times) Overall Cognitive Status: Difficult to assess Area of Impairment: Awareness Following Commands:  Follows multi-step commands consistently Awareness: Emergent General Comments: Not aware of  needing to bring her R impaired Difficult to assess due to: Impaired communication  Physical Exam: Blood pressure 125/70, pulse (!) 49, temperature 97.8 F (36.6 C), temperature source Oral, resp. rate 17, height '5\' 6"'  (1.676 m), weight 62.1 kg (136 lb 14.5 oz), SpO2 90 %. Physical Exam  Vitals reviewed. Constitutional: She appears well-developed.  HENT:  Head: Normocephalic and atraumatic.  Eyes: Conjunctivae and EOM are normal.  Neck: Normal range of motion. Neck supple. No tracheal deviation present. No thyromegaly present.  Cardiovascular:  Cardiac irregular irregular  Respiratory: Effort normal and breath sounds normal. No respiratory distress.  GI: Soft. Bowel sounds are normal. She exhibits no distension. There is no tenderness.  Musculoskeletal: She exhibits no edema or tenderness.  Neurological:  She is alert and oriented x3.  ?Aphasia, however, appears to be more dysarthria Motor: RUE: shoulder abduction, elbow flexion/extension 3/5, wrist/hand 2/5 LUE: 5/5 proximal to distal RLE: 4/5 proximal to distal  LLE:5/5 proximal to distal Sensation intact to light touch DTRs: hyperreflexic b/l LE  Skin: Skin is warm and dry.  Psychiatric: She has a normal mood and affect. Her behavior is normal.     Results for orders placed or performed during the hospital encounter of 07/20/16 (from the past 48 hour(s))  Urine rapid drug screen (hosp performed)not at [facility]     Status: Abnormal   Collection Time: 07/20/16  3:03 PM  Result Value Ref Range   Opiates NONE DETECTED NONE DETECTED   Cocaine POSITIVE (A) NONE DETECTED   Benzodiazepines NONE DETECTED NONE DETECTED   Amphetamines NONE DETECTED NONE DETECTED   Tetrahydrocannabinol NONE DETECTED NONE DETECTED   Barbiturates NONE DETECTED NONE DETECTED    Comment:        DRUG SCREEN FOR MEDICAL PURPOSES ONLY.  IF CONFIRMATION IS NEEDED FOR  ANY PURPOSE, NOTIFY LAB WITHIN 5 DAYS.        LOWEST DETECTABLE LIMITS FOR URINE DRUG SCREEN Drug Class       Cutoff (ng/mL) Amphetamine      1000 Barbiturate      200 Benzodiazepine   250 Tricyclics       037 Opiates          300 Cocaine          300 THC              50   Urinalysis, Routine w reflex microscopic     Status: Abnormal   Collection Time: 07/20/16  3:03 PM  Result Value Ref Range   Color, Urine STRAW (A) YELLOW   APPearance CLEAR CLEAR   Specific Gravity, Urine 1.012 1.005 - 1.030   pH 7.0 5.0 - 8.0   Glucose, UA NEGATIVE NEGATIVE mg/dL   Hgb urine dipstick NEGATIVE NEGATIVE   Bilirubin Urine NEGATIVE NEGATIVE   Ketones, ur NEGATIVE NEGATIVE mg/dL   Protein, ur NEGATIVE NEGATIVE mg/dL   Nitrite NEGATIVE NEGATIVE   Leukocytes, UA NEGATIVE NEGATIVE  CBC     Status: Abnormal   Collection Time: 07/21/16  8:17 AM  Result Value Ref Range   WBC 6.0 4.0 - 10.5 K/uL   RBC 3.64 (L) 3.87 - 5.11 MIL/uL   Hemoglobin 11.9 (L) 12.0 - 15.0 g/dL   HCT 36.4 36.0 - 46.0 %   MCV 100.0 78.0 - 100.0 fL   MCH 32.7 26.0 - 34.0 pg   MCHC 32.7 30.0 - 36.0 g/dL   RDW 14.7 11.5 - 15.5 %   Platelets 278 150 - 400 K/uL  Basic metabolic panel     Status: Abnormal   Collection Time: 07/21/16  8:17 AM  Result Value Ref Range   Sodium 139 135 - 145 mmol/L   Potassium 3.8 3.5 - 5.1 mmol/L   Chloride 105 101 - 111 mmol/L   CO2 24 22 - 32 mmol/L   Glucose, Bld 105 (H) 65 - 99 mg/dL   BUN 6 6 - 20 mg/dL   Creatinine, Ser 0.85 0.44 - 1.00 mg/dL   Calcium 9.2 8.9 - 10.3 mg/dL   GFR calc non Af Amer >60 >60 mL/min   GFR calc Af Amer >60 >60 mL/min    Comment: (NOTE) The eGFR has been calculated using the CKD EPI equation. This calculation has not been validated in all clinical situations. eGFR's persistently <60 mL/min signify possible Chronic Kidney Disease.    Anion gap 10 5 - 15  TSH     Status: None   Collection Time: 07/21/16  8:17 AM  Result Value Ref Range   TSH 2.223 0.350  - 4.500 uIU/mL    Comment: Performed by a 3rd Generation assay with a functional sensitivity of <=0.01 uIU/mL.  Vitamin B12     Status: None   Collection Time: 07/21/16  8:17 AM  Result Value Ref Range   Vitamin B-12 236 180 - 914 pg/mL    Comment: (NOTE) This assay is not validated for testing neonatal or myeloproliferative syndrome specimens for Vitamin B12 levels.   RPR     Status: None   Collection Time: 07/21/16  8:17 AM  Result Value Ref Range   RPR Ser Ql Non Reactive Non Reactive    Comment: (NOTE) Performed At: [facility] [address] [place], [place] [id number] Lindon Romp MD [id number]   HIV antibody     Status: None   Collection Time: 07/21/16  8:17 AM  Result Value Ref Range   HIV Screen 4th Generation wRfx Non Reactive Non Reactive    Comment: (NOTE) Performed At: [facility] [place], [place] [id number] Lindon Romp MD PY:[id number]   Rheumatoid factor     Status: None   Collection Time: 07/21/16  8:17 AM  Result Value Ref Range   Rhuematoid fact SerPl-aCnc <10.0 0.0 - 13.9 IU/mL    Comment: (NOTE) Performed At: [facility] [place], [place] [id number] Lindon Romp MD [id number]   C3 complement     Status: None   Collection Time: 07/21/16  8:17 AM  Result Value Ref Range   C3 Complement 104 82 - 167 mg/dL    Comment: (NOTE) Performed At: [facility] [place], [place] [id number] Lindon Romp MD OX:[id number]   C4 complement     Status: None   Collection Time: 07/21/16  8:17 AM  Result Value Ref Range   Complement C4, Body Fluid 24 14 - 44 mg/dL    Comment: (NOTE) Performed At: [facility] [place], [place] [id number] Lindon Romp MD [id number]   Lupus anticoagulant panel     Status: None   Collection Time: 07/21/16  8:17 AM  Result Value Ref Range   PTT Lupus Anticoagulant 30.8 0.0 - 51.9 sec   DRVVT 31.2 0.0 -  47.0 sec   Lupus Anticoag Interp Comment:     Comment: (NOTE) No lupus anticoagulant was detected. Performed At: [facility] [place], [place] [id number] Lindon Romp MD EH:[id number]   CBC  Status: Abnormal   Collection Time: 07/22/16  3:17 AM  Result Value Ref Range   WBC 4.9 4.0 - 10.5 K/uL   RBC 3.61 (L) 3.87 - 5.11 MIL/uL   Hemoglobin 11.8 (L) 12.0 - 15.0 g/dL   HCT 36.5 36.0 - 46.0 %   MCV 101.1 (H) 78.0 - 100.0 fL   MCH 32.7 26.0 - 34.0 pg   MCHC 32.3 30.0 - 36.0 g/dL   RDW 15.0 11.5 - 15.5 %   Platelets 265 150 - 400 K/uL  Basic metabolic panel     Status: Abnormal   Collection Time: 07/22/16  3:17 AM  Result Value Ref Range   Sodium 141 135 - 145 mmol/L   Potassium 4.2 3.5 - 5.1 mmol/L   Chloride 104 101 - 111 mmol/L   CO2 29 22 - 32 mmol/L   Glucose, Bld 104 (H) 65 - 99 mg/dL   BUN 7 6 - 20 mg/dL   Creatinine, Ser 0.95 0.44 - 1.00 mg/dL   Calcium 9.3 8.9 - 10.3 mg/dL   GFR calc non Af Amer >60 >60 mL/min   GFR calc Af Amer >60 >60 mL/min    Comment: (NOTE) The eGFR has been calculated using the CKD EPI equation. This calculation has not been validated in all clinical situations. eGFR's persistently <60 mL/min signify possible Chronic Kidney Disease.    Anion gap 8 5 - 15   Mr Brain Wo Contrast  Result Date: 07/21/2016 CLINICAL DATA:  Acute onset RIGHT hemiplegia and aphasia. Follow-up stroke. EXAM: MRI HEAD WITHOUT CONTRAST TECHNIQUE: Multiplanar, multiecho pulse sequences of the brain and surrounding structures were obtained without intravenous contrast. COMPARISON:  CT HEAD July 20, 2016 FINDINGS: BRAIN: Moderate at least 4 cm area of reduced diffusion LEFT posterior frontal lobe larger distribution on prior CT. Discrete small area mesial LEFT frontal lobe cortical reduced diffusion, all regions demonstrate low ADC values. No susceptibility artifact to suggest hemorrhage. The ventricles and sulci are normal for patient's age.  Patchy FLAIR T2 hyperintense signal exclusive of the aforementioned abnormality. No suspicious parenchymal signal, masses or mass effect. No abnormal extra-axial fluid collections. No extra-axial masses though, contrast enhanced sequences would be more sensitive. VASCULAR: Normal major intracranial vascular flow voids present at skull base. SKULL AND UPPER CERVICAL SPINE: No abnormal sellar expansion. No suspicious calvarial bone marrow signal. Craniocervical junction maintained. SINUSES/ORBITS: Small LEFT mastoid effusion. The included ocular globes and orbital contents are non-suspicious. OTHER: None. IMPRESSION: Acute moderate nonhemorrhagic LEFT frontal lobe infarct (predominantly MCA and minimal ACA territories). Mild to moderate chronic small vessel ischemic disease. Electronically Signed   By: Elon Alas M.D.   On: 07/21/2016 04:35   Dg Swallowing Func-speech Pathology  Result Date: 07/20/2016 Objective Swallowing Evaluation: Type of Study: MBS-Modified Barium Swallow Study Patient Details Name: Sharayah Renfrow MRN: [id number] Date of Birth: [birth year] Today's Date: 07/20/2016 Time: SLP Start Time (ACUTE ONLY): 1305-SLP Stop Time (ACUTE ONLY): 1322 SLP Time Calculation (min) (ACUTE ONLY): 17 min Past Medical History: Past Medical History: Diagnosis Date . Lupus  . Sciatica of right side  . Tobacco abuse  Past Surgical History: Past Surgical History: Procedure Laterality Date . TOTAL HIP ARTHROPLASTY Right 06/03/2015  Procedure: RIGHT TOTAL HIP ARTHROPLASTY ANTERIOR APPROACH;  Surgeon: Gaynelle Arabian, MD;  Location: [facility];  Service: Orthopedics;  Laterality: Right; . TUBAL LIGATION   HPI: Danita Proud a 70 y.o.femalewho presents to the Emergency Department with right sided weakness, right sided facial droop, right neglect and aphasia. CT  minimal propagation of LEFT frontal lobe acute infarct. No hemorrhagic conversion. No Data Recorded Assessment / Plan / Recommendation CHL IP CLINICAL IMPRESSIONS  07/20/2016 Therapy Diagnosis -- Clinical Impression Mild oropharyngeal dysphagia marked by decreased bolus cohesion resulting in premature spill to valleculae with thin x 1 and once with oral residue to pyriform sinuses. Trace and flash penetration x 1 with straw. Recommend Dys 3 texture, thin liquids, small straw sips allowed, pills whole in applesauce.   Impact on safety and function --   CHL IP TREATMENT RECOMMENDATION 07/20/2016 Treatment Recommendations Therapy as outlined in treatment plan below   Prognosis 07/20/2016 Prognosis for Safe Diet Advancement Good Barriers to Reach Goals -- Barriers/Prognosis Comment -- CHL IP DIET RECOMMENDATION 07/20/2016 SLP Diet Recommendations Dysphagia 3 (Mech soft) solids;Thin liquid Liquid Administration via Cup;Straw Medication Administration Whole meds with puree Compensations Slow rate;Small sips/bites Postural Changes Seated upright at 90 degrees   CHL IP OTHER RECOMMENDATIONS 07/20/2016 Recommended Consults -- Oral Care Recommendations Oral care BID Other Recommendations --   CHL IP FOLLOW UP RECOMMENDATIONS 07/20/2016 Follow up Recommendations Inpatient Rehab   CHL IP FREQUENCY AND DURATION 07/20/2016 Speech Therapy Frequency (ACUTE ONLY) min 2x/week Treatment Duration 2 weeks      CHL IP ORAL PHASE 07/20/2016 Oral Phase Impaired Oral - Pudding Teaspoon -- Oral - Pudding Cup -- Oral - Honey Teaspoon -- Oral - Honey Cup -- Oral - Nectar Teaspoon -- Oral - Nectar Cup -- Oral - Nectar Straw -- Oral - Thin Teaspoon -- Oral - Thin Cup Premature spillage;Piecemeal swallowing Oral - Thin Straw Piecemeal swallowing Oral - Puree Piecemeal swallowing Oral - Mech Soft -- Oral - Regular Delayed oral transit Oral - Multi-Consistency -- Oral - Pill -- Oral Phase - Comment --  CHL IP PHARYNGEAL PHASE 07/20/2016 Pharyngeal Phase Impaired Pharyngeal- Pudding Teaspoon -- Pharyngeal -- Pharyngeal- Pudding Cup -- Pharyngeal -- Pharyngeal- Honey Teaspoon -- Pharyngeal -- Pharyngeal- Honey Cup --  Pharyngeal -- Pharyngeal- Nectar Teaspoon -- Pharyngeal -- Pharyngeal- Nectar Cup -- Pharyngeal -- Pharyngeal- Nectar Straw -- Pharyngeal -- Pharyngeal- Thin Teaspoon -- Pharyngeal -- Pharyngeal- Thin Cup WFL Pharyngeal -- Pharyngeal- Thin Straw Penetration/Aspiration during swallow;Delayed swallow initiation-pyriform sinuses Pharyngeal Material enters airway, remains ABOVE vocal cords then ejected out Pharyngeal- Puree Pharyngeal residue - valleculae;Reduced tongue base retraction Pharyngeal -- Pharyngeal- Mechanical Soft -- Pharyngeal -- Pharyngeal- Regular WFL Pharyngeal -- Pharyngeal- Multi-consistency -- Pharyngeal -- Pharyngeal- Pill -- Pharyngeal -- Pharyngeal Comment --  CHL IP CERVICAL ESOPHAGEAL PHASE 07/20/2016 Cervical Esophageal Phase WFL Pudding Teaspoon -- Pudding Cup -- Honey Teaspoon -- Honey Cup -- Nectar Teaspoon -- Nectar Cup -- Nectar Straw -- Thin Teaspoon -- Thin Cup -- Thin Straw -- Puree -- Mechanical Soft -- Regular -- Multi-consistency -- Pill -- Cervical Esophageal Comment -- No flowsheet data found. Houston Siren 07/20/2016, 3:08 PM Orbie Pyo Colvin Caroli.Ed CCC-SLP Pager [phone]                  Medical Problem List and Plan: 1.  Right hemiparesis and ?aphasia vs dysarthria secondary to left frontal infarct 2.  DVT Prophylaxis/Anticoagulation: Eliquis. Venous Doppler studies negative 3. Pain Management/lupus: Ultram and Robaxin as needed. 4. Hypertension. Lisinopril 20 mg daily. Monitor with increased mobility 5. Neuropsych: This patient is capable of making decisions on her own behalf. 6. Skin/Wound Care: Routine skin checks 7. Fluids/Electrolytes/Nutrition: Routine I&O with follow-up chemistries 8. Hyperlipidemia. Lipitor 9. Tobacco/cocaine abuse. Nicoderm patch. Provide counseling 10. AKI: Follow up BMP.   Post Admission  Physician Evaluation: 1. Preadmission assessment reviewed and changes made below. 2. Functional deficits secondary  to Right hemiparesis  and ?aphasia vs dysarthria . 3. Patient is admitted to receive collaborative, interdisciplinary care between the physiatrist, rehab nursing staff, and therapy team. 4. Patient's level of medical complexity and substantial therapy needs in context of that medical necessity cannot be provided at a lesser intensity of care such as a SNF. 5. Patient has experienced substantial functional loss from his/her baseline which was documented above under the "Functional History" and "Functional Status" headings.  Judging by the patient's diagnosis, physical exam, and functional history, the patient has potential for functional progress which will result in measurable gains while on inpatient rehab.  These gains will be of substantial and practical use upon discharge  in facilitating mobility and self-care at the household level. 6. Physiatrist will provide 24 hour management of medical needs as well as oversight of the therapy plan/treatment and provide guidance as appropriate regarding the interaction of the two. 7. The Preadmission Screening has been reviewed and patient status is unchanged unless otherwise stated above. 8. 24 hour rehab nursing will assist with safety, disease management, medication administration and patient education  and help integrate therapy concepts, techniques,education, etc. 9. PT will assess and treat for/with: Lower extremity strength, range of motion, stamina, balance, functional mobility, safety, adaptive techniques and equipment, coping skills, pain control, stroke education.   Goals are: Mod I. 10. OT will assess and treat for/with: ADL's, functional mobility, safety, upper extremity strength, adaptive techniques and equipment, ego support, and community reintegration.   Goals are: Mod I/Supervision. Therapy may proceed with showering this patient. 11. SLP will assess and treat for/with: speech, language, cognition.  Goals are: Mod I. 12. Case Management and Social Worker will assess  and treat for psychological issues and discharge planning. 92. Team conference will be held weekly to assess progress toward goals and to determine barriers to discharge. 14. Patient will receive at least 3 hours of therapy per day at least 5 days per week. 15. ELOS: 15-19 days.       16. Prognosis:  good  Delice Lesch, MD, Mellody Drown 07/22/2016

## 2016-07-25 NOTE — Progress Notes (Signed)
CCMD notified RN that patient had a 2.04 second pause on telemetry strip. NP notified. Patient asymptomatic, sitting up in bed speaking to rehab admission RN.

## 2016-07-25 NOTE — Progress Notes (Signed)
STROKE TEAM PROGRESS NOTE   SUBJECTIVE (INTERVAL HISTORY) Her husband is at the bedside. She is happy her TEE was canceled. She is excited to go to rehab. Ready to eat breakfast.    OBJECTIVE Temp:  [97.7 F (36.5 C)-98.4 F (36.9 C)] 97.7 F (36.5 C) (12/11 0959) Pulse Rate:  [60-100] 73 (12/11 0959) Cardiac Rhythm: Atrial fibrillation (12/11 0814) Resp:  [17-20] 18 (12/11 0959) BP: (101-127)/(52-72) 122/72 (12/11 0959) SpO2:  [97 %-100 %] 99 % (12/11 0959)  CBC:   Recent Labs Lab 07/20/16 0235  07/23/16 0221 07/24/16 0311  WBC 7.3  < > 5.4 6.0  NEUTROABS 4.8  --   --   --   HGB 12.4  < > 12.7 12.4  HCT 37.9  < > 38.3 37.9  MCV 101.3*  < > 100.0 100.0  PLT 270  < > 267 300  < > = values in this interval not displayed.  Basic Metabolic Panel:   Recent Labs Lab 07/23/16 0221 07/24/16 0311  NA 138 138  K 4.0 3.7  CL 105 101  CO2 26 31  GLUCOSE 113* 108*  BUN 11 10  CREATININE 0.87 1.01*  CALCIUM 9.3 9.0    Lipid Panel:     Component Value Date/Time   CHOL 181 07/20/2016 0440   TRIG 85 07/20/2016 0440   HDL 62 07/20/2016 0440   CHOLHDL 2.9 07/20/2016 0440   VLDL 17 07/20/2016 0440   LDLCALC 102 (H) 07/20/2016 0440   HgbA1c:  Lab Results  Component Value Date   HGBA1C 5.2 07/20/2016   Urine Drug Screen:     Component Value Date/Time   LABOPIA NONE DETECTED 07/20/2016 1503   COCAINSCRNUR POSITIVE (A) 07/20/2016 1503   LABBENZ NONE DETECTED 07/20/2016 1503   AMPHETMU NONE DETECTED 07/20/2016 1503   THCU NONE DETECTED 07/20/2016 1503   LABBARB NONE DETECTED 07/20/2016 1503      IMAGING  Ct Head Code Stroke Wo Contrast` 07/20/2016 1. Small suspected acute LEFT posterior frontal lobe infarct. Moderate chronic small vessel ischemic disease.  2. ASPECTS is 9.   Ct Head Wo Contrast 07/20/2016 Minimal propagation of LEFT frontal lobe acute infarct. No hemorrhagic conversion. Otherwise stable examination including moderate chronic small vessel  ischemic disease.  CTA NECK 07/20/2016 Moderate stenosis LEFT vertebral artery origin without hemodynamically significant stenosis by NASCET criteria.   CTA HEAD 07/20/2016 No emergent large vessel occlusion or high-grade stenosis. Intracranial atherosclerosis, most apparent within the posterior circulation resulting in moderate luminal irregularity.  MRI  07/21/2016 Acute moderate nonhemorrhagic LEFT frontal lobe infarct (predominantly MCA and minimal ACA territories). Mild to moderate chronic small vessel ischemic disease.  EEG  07/20/2016 This awake and asleep EEG is abnormal due to focal slowing over the left hemisphere.  TTE 07/20/2016 Left ventricle:  The cavity size was normal. Wall thickness was normal. Systolic function was normal. The estimated ejection fraction was in the range of 50% to 55%. Wall motion was normal; there were no regional wall motion abnormalities.  LE venous Doppler  07/21/2016 No evidence of deep vein thrombosis or baker's cysts bilaterally.   PHYSICAL EXAM General - Well nourished, well developed, in no apparent distress. Ophthalmologic - Fundi not visualized due to noncooperation. Cardiovascular - Regular rate and rhythm. Mental Status -  Level of arousal and orientation to self were intact, however not able to answer other orientation questions due to aphasia. Language exam showed moderate dysarthria, able to follow simple commands, expressive aphasia, able to repeat one  simple sentence also dysarthric, but not able to name. No neglect Cranial Nerves II - XII - II - blinking to visual threat bilaterally. III, IV, VI - Extraocular movements intact V - Facial sensation intact. VII - right facial droop VIII - Hearing & vestibular intact bilaterally. X - Palate elevates symmetrically, moderate dysarthria. XI - Chin turning & shoulder shrug intact bilaterally. XII - Tongue protrusion midline. Motor Strength - The patient's strength was 5/5 LUE  and LLE, RUE 4-/5 proximal and 0/5 distal and RLE 4-/5.  Bulk was normal and fasciculations were absent.   Motor Tone - Muscle tone was assessed at the neck and appendages and was normal Reflexes - The patient's reflexes were 1+ in all extremities and she had no pathological reflexes. Sensory - Light touch, temperature/pinprick were assessed and were symmetrical.   Coordination - not cooperative on exam.  Tremor was absent. Gait and Station - walk with walker, mild hemiparetic gait on the right.   ASSESSMENT/PLAN [name] is a 70 y.o. female with history of lupus and tobacco abuse presenting with R hemiplegia and aphasia. She received IV t-PA 07/20/2016 at 0312.   Stroke:  Dominant left frontal infarct s/p tPA, likely secondary to newly diagnosed afib. UDS positive for cocaine though.   Resultant  R hemiparesis, aphasia, dysarthria  CTA head  No significant stenosis  CTA neck  Mod L VA stenosis, b/l siphon nonflow limiting athero  S/p tPA with neuro worsening - CT head L frontal infarct, no hemorrhage  MRI  left MCA frontal lobe infarct  2D Echo  EF 50-55 %. No source of embolus    EEG left-sided slowing. No seizure activity  LE venous doppler negative for DVT  EEG L sided focal slowing. No seizure  TEE considered, then canceled given anticoagulation  LDL 102  HgbA1c 5.2  UDS positive for cocaine  Hypercoagulable and autoimmune work up so far negative  lovenox for VTE prophylaxis DIET DYS 3 Room service appropriate? Yes; Fluid consistency: Thin  aspirin 81 mg daily prior to admission, now on eliquis '5mg'$  bid.  Ongoing aggressive stroke risk factor management  Therapy recommendations:  CIR   Disposition:  Discharge to CIR today. Patient agreeable  Paroxysmal afib - newly diagnosed  Confirmed on EKG over night  Cardiology consulted and confirmed afib  Was put on metoprolol for rate control, but discontinued due to low BP and bradycardia  On eliquis  '5mg'$  bid. Continue on discharge.  Cocaine abuse  UDS positive for cocaine  Pt admitted cocaine use  Cessation counseling provided again  Pt is willing to quit  Possible seizure  Inconsistent story from family vs. EMS  No further acitivity  EEG no seizures. Left-sided slowing  ? Lupus  Listed as PMH  She follows with rheumatology at [facility]. Was on prednisone before but she stopped by herself  Autoimmune work up, lupus anticoagulant so far negative   LE venous doppler negative for DVT.   Hyperlipidemia  Home meds:  No statin  LDL 102, goal < 70  Add lipitor '20mg'$   Continue statin at discharge  Tobacco abuse  Current smoker  Smoking cessation counseling will be provided  Nicotine patch   Other Stroke Risk Factors  Advanced age  Other Active Problems  R sciatica  Bradycardia, 40s - 78s -> improved   Hospital day # [place] for Pager information 07/25/2016 2:05 PM  I have personally examined this patient, reviewed  notes, independently viewed imaging studies, participated in medical decision making and plan of care.ROS completed by me personally and pertinent positives fully documented  I have made any additions or clarifications directly to the above note. Agree with note above.  I discussed the case with Dr. Erlinda Hong and TEE can be canceled and is not medically necessary. Patient is medically stable to go to inpatient rehabilitation today. Follow-up as an outpatient in stroke clinic with Dr. Larey Days, [place] Pager: [phone] 07/25/2016 2:32 PM  To contact Stroke Continuity provider, please refer to [email or web address] After hours, contact General Neurology

## 2016-07-25 NOTE — Care Management Note (Signed)
Case Management Note  Patient Details  Name: Andrea Kennedy MRN: [id number] Date of Birth: Jan 06, 1946  Subjective/Objective:                    Action/Plan: Pt discharging to CIR today. No further needs per CM.   Expected Discharge Date:                  Expected Discharge Plan:  [place]  In-House Referral:     Discharge planning Services  CM Consult  Post Acute Care Choice:    Choice offered to:     DME Arranged:    DME Agency:     HH Arranged:    [place] Agency:     Status of Service:  Completed, signed off  If discussed at [employer] of Stay Meetings, dates discussed:    Additional Comments:  Pollie Friar, RN 07/25/2016, 2:40 PM

## 2016-07-25 NOTE — PMR Pre-admission (Signed)
PMR Admission Coordinator Pre-Admission Assessment  Patient: Andrea Kennedy is an 70 y.o., female MRN: [id number] DOB: 04/19/1946 Height: 5' 8" (172.7 cm) Weight: 61.3 kg (135 lb 1.6 oz)              Insurance Information HMO: Yes   PPO:       PCP:       IPA:       80/20:       OTHER:  Group #[id number] PRIMARY:  [employer] medicare HMO      Policy#: [id number]      Subscriber:  Kadedra Rumple CM Name: Meletta      Phone#: [phone]     Fax#: [phone] Pre-Cert#: [id number] from 12/11 to 07/29/16; Update 07/29/16      Employer:  Self employed cleans houses Benefits:  Phone #: [phone]     Name:  Alex Eff. Date:  08/15/14     Deduct:  $0      Out of Pocket Max:  $5900 (met $10)      Life Max:  None CIR: $295 days 1-6      SNF: $0 days 1-20; $160 days 21-100 Outpatient:  Medical necessity     Co-Pay: $40/visit Home Health: 100%      Co-Pay: none DME: 80%     Co-Pay: 20% Providers: in network  Emergency Contact Information Contact Information    Name Relation Home Work Mobile   Webb,Bobby Significant other [phone]  [phone]   Lambert,Diana Daughter   [phone]     Current Medical History  Patient Admitting Diagnosis:  L frontal lobe infarct  History of Present Illness: A 70 y.o.right handed femalewith history of lupus, tobacco abuse, right total hip arthroplasty 06/03/2015 and was discharged to [facility]. Per chart review patient lives with spouse independent prior to admission. One level home with 3 steps to entry. Presented 07/20/2016 with right-sided weakness and aphasia.Urine drug screen positive for cocaine. CT/MRI of the head showed acute moderate nonhemorrhagic left frontal lobe infarct. Patient did receive TPA.. CTA of head and neck with no emergent large vessel occlusion or stenosis. EEG showed focal cerebral dysfunction over the left hemisphere suggestive of underlying structural or physiologic abnormality. No seizure activity noted. Venous Dopplers  lower extremities negative for DVT.Echocardiogram with ejection fraction of 55%. Systolic function was normal. Neurology consulted maintained on Plavix and aspirin 325 mg daily for CVA prophylaxis. Subcutaneous Lovenox for DVT prophylaxis. Patient maintained on telemetry showing rapid irregular heart rate that appeared consistent with atrial fibrillation. Cardiology service is consulted advised Eliquis and aspirin/Plavix as well as Lovenox were discontinued at that time. Tolerating a mechanical soft diet. Patient with ongoing bouts Elevated blood pressure and received intravenous hydralazine.Consideration was made for TEE however it was canceled by neurology services did not feel any further workup is needed. Physical and occupational therapy evaluations completed with recommendations of physical medicine rehabilitation consult. Patient to be admitted for comprehensive inpatient rehabilitation program.    Total: 5=NIH  Past Medical History  Past Medical History:  Diagnosis Date  . Lupus   . Sciatica of right side   . Tobacco abuse     Family History  family history includes Breast cancer in her mother; Diabetes in her mother; Fibromyalgia in her sister; Heart attack in her father.  Prior Rehab/Hospitalizations: Was at [facility] 10/16 after hip replacement.  Has the patient had major surgery during 100 days prior to admission? No  Current Medications   Current Facility-Administered Medications:  .    0.9 %  sodium chloride infusion, , Intravenous, Continuous, Jindong Xu, MD, Last Rate: 40 mL/hr at 07/25/16 0026 .  acetaminophen (TYLENOL) tablet 650 mg, 650 mg, Oral, Q6H PRN, Eric Lindzen, MD .  apixaban (ELIQUIS) tablet 5 mg, 5 mg, Oral, BID, Jindong Xu, MD, 5 mg at 07/25/16 1149 .  atorvastatin (LIPITOR) tablet 20 mg, 20 mg, Oral, q1800, Jindong Xu, MD, 20 mg at 07/24/16 1653 .  cyclobenzaprine (FLEXERIL) tablet 5 mg, 5 mg, Oral, TID PRN, Eric Lindzen, MD, 5 mg at 07/23/16 2358 .   docusate sodium (COLACE) capsule 100 mg, 100 mg, Oral, BID, Eric Lindzen, MD, 100 mg at 07/25/16 1153 .  fentaNYL (SUBLIMAZE) injection 25-50 mcg, 25-50 mcg, Intravenous, Q4H PRN, Eric Lindzen, MD, 25 mcg at 07/22/16 0506 .  hydrALAZINE (APRESOLINE) injection 2 mg, 2 mg, Intravenous, Once, Eric Lindzen, MD .  Influenza vac split quadrivalent PF (FLUARIX) injection 0.5 mL, 0.5 mL, Intramuscular, Prior to discharge, Eric Lindzen, MD .  lisinopril (PRINIVIL,ZESTRIL) tablet 20 mg, 20 mg, Oral, Daily, Jindong Xu, MD, 20 mg at 07/25/16 1154 .  MEDLINE mouth rinse, 15 mL, Mouth Rinse, BID, Eric Lindzen, MD, 15 mL at 07/25/16 1155 .  methocarbamol (ROBAXIN) tablet 500 mg, 500 mg, Oral, TID, Eric Lindzen, MD, 500 mg at 07/25/16 1149 .  multivitamin with minerals tablet 1 tablet, 1 tablet, Oral, Daily, Eric Lindzen, MD, 1 tablet at 07/25/16 1149 .  nicotine (NICODERM CQ - dosed in mg/24 hours) patch 21 mg, 21 mg, Transdermal, Daily, Eric Lindzen, MD, 21 mg at 07/24/16 1000 .  pantoprazole (PROTONIX) EC tablet 40 mg, 40 mg, Oral, Daily, Jindong Xu, MD, 40 mg at 07/25/16 1154 .  senna-docusate (Senokot-S) tablet 1 tablet, 1 tablet, Oral, QHS PRN, Eric Lindzen, MD .  traMADol (ULTRAM) tablet 50-100 mg, 50-100 mg, Oral, Q6H PRN, Eric Lindzen, MD, 100 mg at 07/24/16 2056 .  vitamin B-12 (CYANOCOBALAMIN) tablet 1,000 mcg, 1,000 mcg, Oral, Daily, Jindong Xu, MD, 1,000 mcg at 07/25/16 1149 .  zolpidem (AMBIEN) tablet 5 mg, 5 mg, Oral, QHS PRN, Eric Lindzen, MD  Patients Current Diet: DIET DYS 3 Room service appropriate? Yes; Fluid consistency: Thin  Precautions / Restrictions Precautions Precautions: Fall Precaution Comments: right hemiparesis and aphasia Restrictions Weight Bearing Restrictions: No   Has the patient had 2 or more falls or a fall with injury in the past year?Yes.  Had 1 fall resulting in need for hip replacement.  Prior Activity Level Community (5-7x/wk): Was active, went out daily, did  some driving.  Cleaned houses.  Husband sometimes drives.  Home Assistive Devices / Equipment Home Assistive Devices/Equipment: Walker (specify type) (doesnt use walker) Home Equipment: Walker - 2 wheels, Cane - single point  Prior Device Use: Indicate devices/aids used by the patient prior to current illness, exacerbation or injury? None.  Used a cane for about 2 months after hip replacement, but no device used recently.  Prior Functional Level Prior Function Level of Independence: Independent Comments: Drives, cooks, cleans, caregiver  Self Care: Did the patient need help bathing, dressing, using the toilet or eating?  Independent  Indoor Mobility: Did the patient need assistance with walking from room to room (with or without device)? Independent  Stairs: Did the patient need assistance with internal or external stairs (with or without device)? Independent  Functional Cognition: Did the patient need help planning regular tasks such as shopping or remembering to take medications? Independent  Current Functional Level Cognition  Arousal/Alertness: Awake/alert Overall Cognitive Status: Difficult   to assess Difficult to assess due to: Impaired communication Orientation Level: Oriented X4 Following Commands: Follows multi-step commands consistently General Comments: Some right inattention Attention: Sustained Sustained Attention: Impaired Sustained Attention Impairment: Verbal basic Memory:  (will assess further in therapy) Awareness: Appears intact Problem Solving: Appears intact Behaviors: Lability Safety/Judgment: Appears intact    Extremity Assessment (includes Sensation/Coordination)  Upper Extremity Assessment: RUE deficits/detail RUE Deficits / Details: Brunstrom level II hand; IV arm - deviating from synergy. able to extend RUE against gravity. Minimal spasticity RUE. minimal active finger flexion. likely imparied sensation.   Lower Extremity Assessment: Defer to PT  evaluation RLE Deficits / Details: Grossly ~4/5 throughout. RLE Sensation:  [facility].)    ADLs  Overall ADL's : Needs assistance/impaired Eating/Feeding: Minimal assistance Eating/Feeding Details (indicate cue type and reason): difficulty manipulating utensil with L hand. Difficulty scooping food onto fork. Minimal pocketing R cheek, but clearing it independently Grooming: Moderate assistance Upper Body Bathing: Minimal assistance, Bed level Lower Body Bathing: Minimal assistance, Sit to/from stand Upper Body Dressing : Moderate assistance, Sitting Lower Body Dressing: Moderate assistance, Sit to/from stand Toilet Transfer: Minimal assistance, Stand-pivot Toileting- Clothing Manipulation and Hygiene: Minimal assistance, Sit to/from stand Functional mobility during ADLs: Minimal assistance General ADL Comments: Very motivated to complete her own self care. Poor awraeness of RUE during tasks    Mobility  Overal bed mobility: Needs Assistance Bed Mobility: Supine to Sit, Sit to Supine Supine to sit: Supervision, HOB elevated Sit to supine: Supervision General bed mobility comments: Difficulty managing RUE.  Increased effort to bring LEs into bed.     Transfers  Overall transfer level: Needs assistance Equipment used: None Transfers: Sit to/from Stand Sit to Stand: Min guard Stand pivot transfers: Min assist General transfer comment: Min guard for safety. Stood from [employer], from low toilet x1 using grab bar for assist.     Ambulation / Gait / Stairs / Wheelchair Mobility  Ambulation/Gait Ambulation/Gait assistance: Museum/gallery curator (Feet): 50 Feet Assistive device: 1 person hand held assist Gait Pattern/deviations: Step-through pattern, Decreased stride length General Gait Details: Slow, unsteady gait with right knee instability but no buckling.  Gait velocity: decreased Gait velocity interpretation: Below normal speed for age/gender    Posture / Balance Dynamic  Sitting Balance Sitting balance - Comments: Able to perform pericare sitting on BSC without LOB. Balance Overall balance assessment: Needs assistance Sitting-balance support: Feet supported, No upper extremity supported Sitting balance-Leahy Scale: Good Sitting balance - Comments: Able to perform pericare sitting on BSC without LOB. Standing balance support: During functional activity Standing balance-Leahy Scale: Fair Standing balance comment: Able to stand statically without UE support but requires Min A for dynamic standing.    Special needs/care consideration BiPAP/CPAP No CPM No Continuous Drip IV No Dialysis No      Life Vest No Oxygen No Special Bed No Trach Size No Wound Vac (area) No      Skin Bruising on arms                             Bowel mgmt: Last BM 07/24/16 Bladder mgmt: Voiding WDL Diabetic mgmt: No     Previous Home Environment Living Arrangements: Spouse/significant other, Other relatives  Lives With: Spouse Available Help at Discharge: Family, Available PRN/intermittently Type of Home: House Home Layout: One level Home Access: Stairs to enter Entrance Stairs-Rails: Right, Left Entrance Stairs-Number of Steps: 3 Home Care Services: No Additional Comments: 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  Discharge Living Setting Plans for Discharge Living Setting: Patient's home, House, Lives with (comment) (Lives with husband and mother-in-law.) Type of Home at Discharge: House Discharge Home Layout: One level Discharge Home Access: Stairs to enter Entrance Stairs-Number of Steps: 5 steps at back door. 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  Daughter works.  Has friend, Lattie Haw, who may assist some. Caregiver Availability: 24/7 Discharge Plan Discussed with Primary Caregiver: Yes Is Caregiver In Agreement with Plan?: Yes Does Caregiver/Family have Issues with Lodging/Transportation while Pt is in Rehab?: No  Goals/Additional Needs Patient/Family Goal for Rehab: PT/OT/SLP supervision goals Expected length of stay: 10-12 days Cultural Considerations: None Dietary Needs: Dys 3, thin liquids Equipment Needs: TBD Pt/Family Agrees to Admission and willing to participate: Yes Program Orientation Provided & Reviewed with Pt/Caregiver Including Roles  & Responsibilities: Yes  Decrease burden of Care through IP rehab admission: N/A  Possible need for SNF placement upon discharge: Not anticipated  Patient Condition: This patient's medical and functional status has changed since the consult dated: 07/21/16 in which the Rehabilitation Physician determined and documented that the patient's condition is appropriate for intensive rehabilitative care in an inpatient rehabilitation facility. See "History of Present Illness" (above) for medical update. Functional changes are:  Currently requiring min assist to ambulate 50 feet +1 HHA. Patient's medical and functional status update has been discussed with the Rehabilitation physician and patient remains appropriate for inpatient rehabilitation. Will admit to inpatient rehab today.  Preadmission Screen Completed By:  Retta Diones, 07/25/2016 1:03 PM ______________________________________________________________________   Discussed status with Dr.  Letta Pate on 12/11/17at 1301 and received telephone approval for admission today.  Admission Coordinator:  Retta Diones, time1301/Date12/11/17

## 2016-07-25 NOTE — Discharge Summary (Signed)
Stroke Discharge Summary  Patient ID: Andrea Kennedy   MRN: [id number]      DOB: Mar 29, 1946  Date of Admission: 07/20/2016 Date of Discharge: 07/25/2016  Attending Physician:  Garvin Fila, MD, Stroke MD Consulting Physician(s):  Candee Furbish, MD (cardiology), Alysia Penna, MD (Physical Medicine & Rehabtilitation)  Patient's PCP:  [name].  Discharge Diagnoses:  Principal Problem:   Stroke (cerebrum) ([place]) - L frontal embolic s/p IV tPA, d/t AF Active Problems:   Alteration in neurological status   Paroxysmal atrial fibrillation (HCC)   Cocaine abuse   Hyperlipidemia   Right hemiparesis (HCC)   Dysarthria, post-stroke   Benign essential HTN   Pure hypercholesterolemia   Tobacco abuse   AKI (acute kidney injury) ([place])   Possible seizure   Possible Lupus   Past Medical History:  Diagnosis Date  . Lupus   . Sciatica of right side   . Tobacco abuse    Past Surgical History:  Procedure Laterality Date  . TOTAL HIP ARTHROPLASTY Right 06/03/2015   Procedure: RIGHT TOTAL HIP ARTHROPLASTY ANTERIOR APPROACH;  Surgeon: Gaynelle Arabian, MD;  Location: [facility];  Service: Orthopedics;  Laterality: Right;  . TUBAL LIGATION      Medications to be continued on Rehab . apixaban  5 mg Oral BID  . atorvastatin  20 mg Oral q1800  . docusate sodium  100 mg Oral BID  . hydrALAZINE  2 mg Intravenous Once  . lisinopril  20 mg Oral Daily  . mouth rinse  15 mL Mouth Rinse BID  . methocarbamol  500 mg Oral TID  . multivitamin with minerals  1 tablet Oral Daily  . nicotine  21 mg Transdermal Daily  . pantoprazole  40 mg Oral Daily  . vitamin B-12  1,000 mcg Oral Daily    LABORATORY STUDIES CBC    Component Value Date/Time   WBC 6.0 07/24/2016 0311   RBC 3.79 (L) 07/24/2016 0311   HGB 12.4 07/24/2016 0311   HCT 37.9 07/24/2016 0311   PLT 300 07/24/2016 0311   MCV 100.0 07/24/2016 0311   MCH 32.7 07/24/2016 0311   MCHC 32.7 07/24/2016 0311   RDW 14.4  07/24/2016 0311   LYMPHSABS 1.7 07/20/2016 0235   MONOABS 0.5 07/20/2016 0235   EOSABS 0.2 07/20/2016 0235   BASOSABS 0.0 07/20/2016 0235   CMP    Component Value Date/Time   NA 138 07/24/2016 0311   K 3.7 07/24/2016 0311   CL 101 07/24/2016 0311   CO2 31 07/24/2016 0311   GLUCOSE 108 (H) 07/24/2016 0311   BUN 10 07/24/2016 0311   CREATININE 1.01 (H) 07/24/2016 0311   CALCIUM 9.0 07/24/2016 0311   PROT 6.6 07/20/2016 0235   ALBUMIN 4.0 07/20/2016 0235   AST 24 07/20/2016 0235   ALT 12 (L) 07/20/2016 0235   ALKPHOS 48 07/20/2016 0235   BILITOT 0.8 07/20/2016 0235   GFRNONAA 56 (L) 07/24/2016 0311   GFRAA >60 07/24/2016 0311   COAGS Lab Results  Component Value Date   INR 1.05 07/20/2016   INR 1.06 06/03/2015   Lipid Panel    Component Value Date/Time   CHOL 181 07/20/2016 0440   TRIG 85 07/20/2016 0440   HDL 62 07/20/2016 0440   CHOLHDL 2.9 07/20/2016 0440   VLDL 17 07/20/2016 0440   LDLCALC 102 (H) 07/20/2016 0440   HgbA1C  Lab Results  Component Value Date   HGBA1C 5.2 07/20/2016  Cardiac Panel (last 3 results) No results for input(s): CKTOTAL, CKMB, TROPONINI, RELINDX in the last 72 hours. Urinalysis    Component Value Date/Time   COLORURINE STRAW (A) 07/20/2016 1503   APPEARANCEUR CLEAR 07/20/2016 1503   LABSPEC 1.012 07/20/2016 1503   PHURINE 7.0 07/20/2016 1503   GLUCOSEU NEGATIVE 07/20/2016 1503   HGBUR NEGATIVE 07/20/2016 1503   BILIRUBINUR NEGATIVE 07/20/2016 1503   KETONESUR NEGATIVE 07/20/2016 1503   PROTEINUR NEGATIVE 07/20/2016 1503   NITRITE NEGATIVE 07/20/2016 1503   LEUKOCYTESUR NEGATIVE 07/20/2016 1503   Urine Drug Screen     Component Value Date/Time   LABOPIA NONE DETECTED 07/20/2016 1503   COCAINSCRNUR POSITIVE (A) 07/20/2016 1503   LABBENZ NONE DETECTED 07/20/2016 1503   AMPHETMU NONE DETECTED 07/20/2016 1503   THCU NONE DETECTED 07/20/2016 1503   LABBARB NONE DETECTED 07/20/2016 1503    Alcohol Level    Component Value  Date/Time   ETH <5 07/20/2016 0236     SIGNIFICANT DIAGNOSTIC STUDIES Ct Head Code Stroke Wo Contrast` 07/20/2016 1. Small suspected acute LEFT posterior frontal lobe infarct. Moderate chronic small vessel ischemic disease.  2. ASPECTS is 9.   Ct Head Wo Contrast 07/20/2016 Minimal propagation of LEFT frontal lobe acute infarct. No hemorrhagic conversion. Otherwise stable examination including moderate chronic small vessel ischemic disease.  CTA NECK 07/20/2016 Moderate stenosis LEFT vertebral artery origin without hemodynamically significant stenosis by NASCET criteria.   CTA HEAD 07/20/2016 No emergent large vessel occlusion or high-grade stenosis. Intracranial atherosclerosis, most apparent within the posterior circulation resulting in moderate luminal irregularity.  MRI  07/21/2016 Acute moderate nonhemorrhagic LEFT frontal lobe infarct (predominantly MCA and minimal ACA territories). Mild to moderate chronic small vessel ischemic disease.  EEG  07/20/2016 This awake and asleepEEG is abnormal due to focal slowing over the left hemisphere.  TTE 07/20/2016 Left ventricle: The cavity size was normal. Wall thickness was normal. Systolic function was normal. The estimated ejection fraction was in the range of 50% to 55%. Wall motion was normal; there were no regional wall motion abnormalities.  LE venous Doppler  07/21/2016 No evidence of deep vein thrombosis or baker's cysts bilaterally.     HISTORY OF PRESENT ILLNESS Andrea Kennedy is an 70 y.o. female who presents with acute onset of right hemiplegia and aphasia. He was LKW 1:15 AM 07/20/2016 per EMS. Husband states first symptom was jerking of her left side and aphasia, with urinary incontinence. He states she was shaking when EMS arrive, but EMS states no shaking or other seizure like activity seen. No prior history of stroke. NIHSS of 15 after CT head. Not taking a blood thinner at home. No prior history of stroke.  IV tPA was given. Patient was admitted to the neuro ICU for further evaluation and treatment.   HOSPITAL COURSE [name] is a 70 y.o. female with history of lupus and tobacco abuse presenting with R hemiplegia and aphasia. She received IV t-PA 07/20/2016 at 0312.   Stroke:  Dominant left frontal infarct s/p tPA, likely secondary to newly diagnosed afib. UDS positive for cocaine though.   Resultant  R hemiparesis, aphasia, dysarthria  CTA head  No significant stenosis  CTA neck  Mod L VA stenosis, b/l siphon nonflow limiting athero  S/p tPA with neuro worsening - CT head L frontal infarct, no hemorrhage  MRI  left MCA frontal lobe infarct  2D Echo  EF 50-55 %. No source of embolus    EEG left-sided slowing. No seizure activity  LE venous doppler negative for DVT  EEG L sided focal slowing. No seizure  TEE considered, then canceled given anticoagulation  LDL 102  HgbA1c 5.2  UDS positive for cocaine  Hypercoagulable and autoimmune work up so far negative  laspirin 81 mg daily prior to admission, now on eliquis '5mg'$  bid.  Ongoing aggressive stroke risk factor management  Therapy recommendations:  CIR   Disposition:  CIR   Paroxysmal afib - newly diagnosed  Confirmed on EKG over night  Cardiology consulted and confirmed afib  Was put on metoprolol for rate control, but discontinued due to low BP and bradycardia  On eliquis '5mg'$  bid. Continue on discharge.  Cocaine abuse  UDS positive for cocaine  Pt admitted cocaine use  Cessation counseling provided again  Pt is willing to quit  Possible seizure  Inconsistent story from family vs. EMS  No further acitivity  EEG no seizures. Left-sided slowing  ? Lupus  Listed as PMH  She follows with rheumatology at [facility]. Was on prednisone before but she stopped by herself  Autoimmune work up, lupus anticoagulant so far negative   LE venous doppler negative for DVT.    Hyperlipidemia  Home meds:  No statin  LDL 102, goal < 70  Add lipitor '20mg'$   Continue statin at discharge  Tobacco abuse  Current smoker  Smoking cessation counseling will be provided  Nicotine patch   Other Stroke Risk Factors  Advanced age  Other Active Problems  R sciatica  Bradycardia, 40s - 50s -> improved    DISCHARGE EXAM Blood pressure 122/72, pulse 73, temperature 97.7 F (36.5 C), temperature source Oral, resp. rate 18, height '5\' 8"'$  (1.727 m), weight 61.3 kg (135 lb 1.6 oz), SpO2 99 %. General - Well nourished, well developed, in no apparent distress. Ophthalmologic - Fundi not visualized due to noncooperation. Cardiovascular - Regular rate and rhythm. Mental Status -  Level of arousal and orientation to self were intact, however not able to answer other orientation questions due to aphasia. Language exam showed moderate dysarthria, able to follow simple commands, expressive aphasia, able to repeat one simple sentence also dysarthric, but not able to name. No neglect Cranial Nerves II - XII - II - blinking to visual threat bilaterally. III, IV, VI - Extraocular movements intact V - Facial sensation intact. VII - right facial droop VIII - Hearing & vestibular intact bilaterally. X - Palate elevates symmetrically, moderate dysarthria. XI - Chin turning & shoulder shrug intact bilaterally. XII - Tongue protrusion midline. Motor Strength - The patient's strength was 5/5 LUE and LLE, RUE 4-/5 proximal and 0/5 distal and RLE 4-/5.  Bulk was normal and fasciculations were absent.   Motor Tone - Muscle tone was assessed at the neck and appendages and was normal Reflexes - The patient's reflexes were 1+ in all extremities and she had no pathological reflexes. Sensory - Light touch, temperature/pinprick were assessed and were symmetrical.   Coordination - not cooperative on exam.  Tremor was absent. Gait and Station - walk with walker, mild hemiparetic gait  on the right.  Discharge Diet  DIET DYS 3 Room service appropriate? Yes; Fluid consistency: Thin liquids  DISCHARGE PLAN  Disposition:  Transfer to [facility] for ongoing PT, OT and ST  Eliquis (apixaban) daily for secondary stroke prevention.  Recommend ongoing risk factor control by Primary Care Physician at time of discharge from inpatient rehabilitation.  Follow-up [name]. in 2 weeks following discharge  from rehab.  Follow-up with Dr. Antony Contras, Stroke Clinic in 6 weeks, office to schedule an appointment.   45 minutes were spent preparing discharge.  [place] for Pager information 07/25/2016 5:21 PM  I have personally examined this patient, reviewed notes, independently viewed imaging studies, participated in medical decision making and plan of care.ROS completed by me personally and pertinent positives fully documented  I have made any additions or clarifications directly to the above note. Agree with note above.   Antony Contras, MD Medical Director [facility] Pager: [phone] 07/25/2016 5:32 PM

## 2016-07-25 NOTE — Progress Notes (Signed)
Retta Diones, RN Rehab Admission Coordinator Signed Physical Medicine and Rehabilitation  PMR Pre-admission Date of Service: 07/25/2016 12:44 PM  Related encounter: ED to Hosp-Admission (Discharged) from 07/20/2016 in [place]       '[]' Hide copied text PMR Admission Coordinator Pre-Admission Assessment  Patient: Skye Plamondon is an 70 y.o., female MRN: [id number] DOB: Nov 17, 1945 Height: '5\' 8"'  (172.7 cm) Weight: 61.3 kg (135 lb 1.6 oz)                                                                                                                                                  Insurance Information HMO: Yes   PPO:       PCP:       IPA:       80/20:       OTHER:  Group #[id number] PRIMARY:  [employer] medicare HMO      Policy#: [id number]      Subscriber:  [name] CM Name: [facility]      Phone#: [phone]     Fax#: [phone] Pre-Cert#: [id number] from 12/11 to 07/29/16; Update 07/29/16      Employer:  Self employed cleans houses Benefits:  Phone #: [phone]     Name:  Christinia Gully. Date:  08/15/14     Deduct:  $0      Out of Pocket Max:  $5900 (met $10)      Life Max:  None CIR: $295 days 1-6      SNF: $0 days 1-20; $160 days 21-100 Outpatient:  Medical necessity     Co-Pay: $40/visit Home Health: 100%      Co-Pay: none DME: 80%     Co-Pay: 20% Providers: in network  Emergency Contact Information        Contact Information    Name Relation Home Work Mobile   Webb,Bobby Significant other Dwight Daughter   [phone]     Current Medical History  Patient Admitting Diagnosis:  L frontal lobe infarct  History of Present Illness: A 70 y.o.right handed femalewith history of lupus, tobacco abuse, right total hip arthroplasty 06/03/2015 and was discharged to [facility] SNF. Per chart review patient lives with spouse independent prior to admission. One level home with 3 steps to entry.  Presented 07/20/2016 with right-sided weakness and aphasia.Urine drug screen positive for cocaine.CT/MRI of the head showed acute moderate nonhemorrhagic left frontal lobe infarct. Patient did receive TPA.. CTA of head and neck with no emergent large vessel occlusion or stenosis. EEG showed focal cerebral dysfunction over the left hemisphere suggestive of underlying structural or physiologic abnormality. No seizure activity noted. Venous Dopplers lower extremities negative for DVT.Echocardiogram with ejection fraction of 55%. Systolic function was normal.Neurology consulted maintainedonPlavix andaspirin 325 mg daily for CVA prophylaxis. Subcutaneous Lovenox for DVT prophylaxis.Patient maintained on  telemetry showing rapid irregular heart rate that appeared consistent with atrial fibrillation. Cardiology service is consulted advised Eliquis and aspirin/Plavix as well as Lovenox were discontinued at that time.Tolerating a mechanical soft diet. Patient with ongoing bouts Elevated blood pressure andreceived intravenous hydralazine.Consideration was made for TEE however it was canceled by neurology services did not feel any further workup is needed.Physical and occupationaltherapy evaluationscompleted with recommendations of physical medicine rehabilitation consult. Patient to be admitted for comprehensive inpatient rehabilitation program.    Total: 5=NIH  Past Medical History      Past Medical History:  Diagnosis Date  . Lupus   . Sciatica of right side   . Tobacco abuse     Family History  family history includes Breast cancer in her mother; Diabetes in her mother; Fibromyalgia in her sister; Heart attack in her father.  Prior Rehab/Hospitalizations: Was at [facility] SNF 10/16 after hip replacement.  Has the patient had major surgery during 100 days prior to admission? No  Current Medications   Current Facility-Administered Medications:  .  0.9 %  sodium chloride infusion,  , Intravenous, Continuous, Rosalin Hawking, MD, Last Rate: 40 mL/hr at 07/25/16 0026 .  acetaminophen (TYLENOL) tablet 650 mg, 650 mg, Oral, Q6H PRN, Kerney Elbe, MD .  apixaban (ELIQUIS) tablet 5 mg, 5 mg, Oral, BID, Rosalin Hawking, MD, 5 mg at 07/25/16 1149 .  atorvastatin (LIPITOR) tablet 20 mg, 20 mg, Oral, q1800, Rosalin Hawking, MD, 20 mg at 07/24/16 1653 .  cyclobenzaprine (FLEXERIL) tablet 5 mg, 5 mg, Oral, TID PRN, Kerney Elbe, MD, 5 mg at 07/23/16 2358 .  docusate sodium (COLACE) capsule 100 mg, 100 mg, Oral, BID, Kerney Elbe, MD, 100 mg at 07/25/16 1153 .  fentaNYL (SUBLIMAZE) injection 25-50 mcg, 25-50 mcg, Intravenous, Q4H PRN, Kerney Elbe, MD, 25 mcg at 07/22/16 0506 .  hydrALAZINE (APRESOLINE) injection 2 mg, 2 mg, Intravenous, Once, Kerney Elbe, MD .  Influenza vac split quadrivalent PF (FLUARIX) injection 0.5 mL, 0.5 mL, Intramuscular, Prior to discharge, Kerney Elbe, MD .  lisinopril (PRINIVIL,ZESTRIL) tablet 20 mg, 20 mg, Oral, Daily, Rosalin Hawking, MD, 20 mg at 07/25/16 1154 .  MEDLINE mouth rinse, 15 mL, Mouth Rinse, BID, Kerney Elbe, MD, 15 mL at 07/25/16 1155 .  methocarbamol (ROBAXIN) tablet 500 mg, 500 mg, Oral, TID, Kerney Elbe, MD, 500 mg at 07/25/16 1149 .  multivitamin with minerals tablet 1 tablet, 1 tablet, Oral, Daily, Kerney Elbe, MD, 1 tablet at 07/25/16 1149 .  nicotine (NICODERM CQ - dosed in mg/24 hours) patch 21 mg, 21 mg, Transdermal, Daily, Kerney Elbe, MD, 21 mg at 07/24/16 1000 .  pantoprazole (PROTONIX) EC tablet 40 mg, 40 mg, Oral, Daily, Rosalin Hawking, MD, 40 mg at 07/25/16 1154 .  senna-docusate (Senokot-S) tablet 1 tablet, 1 tablet, Oral, QHS PRN, Kerney Elbe, MD .  traMADol Veatrice Bourbon) tablet 50-100 mg, 50-100 mg, Oral, Q6H PRN, Kerney Elbe, MD, 100 mg at 07/24/16 2056 .  vitamin B-12 (CYANOCOBALAMIN) tablet 1,000 mcg, 1,000 mcg, Oral, Daily, Rosalin Hawking, MD, 1,000 mcg at 07/25/16 1149 .  zolpidem (AMBIEN) tablet 5 mg, 5 mg, Oral, QHS PRN, Kerney Elbe,  MD  Patients Current Diet: DIET DYS 3 Room service appropriate? Yes; Fluid consistency: Thin  Precautions / Restrictions Precautions Precautions: Fall Precaution Comments: right hemiparesis and aphasia Restrictions Weight Bearing Restrictions: No   Has the patient had 2 or more falls or a fall with injury in the past year?Yes.  Had 1 fall resulting in need for hip replacement.  Prior Activity Level Community (5-7x/wk): Was active, went out daily, did some driving.  Cleaned houses.  Husband sometimes drives.  Home Assistive Devices / Equipment Home Assistive Devices/Equipment: Environmental consultant (specify type) (doesnt use walker) Home Equipment: Walker - 2 wheels, Cane - single point  Prior Device Use: Indicate devices/aids used by the patient prior to current illness, exacerbation or injury? None.  Used a cane for about 2 months after hip replacement, but no device used recently.  Prior Functional Level Prior Function Level of Independence: Independent Comments: Drives, cooks, cleans, caregiver  Self Care: Did the patient need help bathing, dressing, using the toilet or eating?  Independent  Indoor Mobility: Did the patient need assistance with walking from room to room (with or without device)? Independent  Stairs: Did the patient need assistance with internal or external stairs (with or without device)? Independent  Functional Cognition: Did the patient need help planning regular tasks such as shopping or remembering to take medications? Independent  Current Functional Level Cognition Arousal/Alertness: Awake/alert Overall Cognitive Status: Difficult to assess Difficult to assess due to: Impaired communication Orientation Level: Oriented X4 Following Commands: Follows multi-step commands consistently General Comments: Some right inattention Attention: Sustained Sustained Attention: Impaired Sustained Attention Impairment: Verbal basic Memory:  (will assess further in  therapy) Awareness: Appears intact Problem Solving: Appears intact Behaviors: Lability Safety/Judgment: Appears intact    Extremity Assessment (includes Sensation/Coordination) Upper Extremity Assessment: RUE deficits/detail RUE Deficits / Details: Brunstrom level II hand; IV arm - deviating from synergy. able to extend RUE against gravity. Minimal spasticity RUE. minimal active finger flexion. likely imparied sensation.   Lower Extremity Assessment: Defer to PT evaluation RLE Deficits / Details: Grossly ~4/5 throughout. RLE Sensation:  [facility].)   ADLs Overall ADL's : Needs assistance/impaired Eating/Feeding: Minimal assistance Eating/Feeding Details (indicate cue type and reason): difficulty manipulating utensil with L hand. Difficulty scooping food onto fork. Minimal pocketing R cheek, but clearing it independently Grooming: Moderate assistance Upper Body Bathing: Minimal assistance, Bed level Lower Body Bathing: Minimal assistance, Sit to/from stand Upper Body Dressing : Moderate assistance, Sitting Lower Body Dressing: Moderate assistance, Sit to/from stand Toilet Transfer: Minimal assistance, Stand-pivot Toileting- Clothing Manipulation and Hygiene: Minimal assistance, Sit to/from stand Functional mobility during ADLs: Minimal assistance General ADL Comments: Very motivated to complete her own self care. Poor awraeness of RUE during tasks   Mobility Overal bed mobility: Needs Assistance Bed Mobility: Supine to Sit, Sit to Supine Supine to sit: Supervision, HOB elevated Sit to supine: Supervision General bed mobility comments: Difficulty managing RUE.  Increased effort to bring LEs into bed.    Transfers Overall transfer level: Needs assistance Equipment used: None Transfers: Sit to/from Stand Sit to Stand: Min guard Stand pivot transfers: Min assist General transfer comment: Min guard for safety. Stood from [employer], from low toilet x1 using grab bar for assist.    Ambulation /  Gait / Stairs / Wheelchair Mobility Ambulation/Gait Ambulation/Gait assistance: Museum/gallery curator (Feet): 50 Feet Assistive device: 1 person hand held assist Gait Pattern/deviations: Step-through pattern, Decreased stride length General Gait Details: Slow, unsteady gait with right knee instability but no buckling.  Gait velocity: decreased Gait velocity interpretation: Below normal speed for age/gender   Posture / Balance Dynamic Sitting Balance Sitting balance - Comments: Able to perform pericare sitting on BSC without LOB. Balance Overall balance assessment: Needs assistance Sitting-balance support: Feet supported, No upper extremity supported Sitting balance-Leahy Scale: Good Sitting balance - Comments: Able to perform pericare sitting on [facility]  without LOB. Standing balance support: During functional activity Standing balance-Leahy Scale: Fair Standing balance comment: Able to stand statically without UE support but requires Min A for dynamic standing.   Special needs/care consideration BiPAP/CPAP No CPM No Continuous Drip IV No Dialysis No      Life Vest No Oxygen No Special Bed No Trach Size No Wound Vac (area) No      Skin Bruising on arms                             Bowel mgmt: Last BM 07/24/16 Bladder mgmt: Voiding WDL Diabetic mgmt: No    Previous Home Environment Living Arrangements: Spouse/significant other, Other relatives  Lives With: Spouse Available Help at Discharge: Family, Available PRN/intermittently Type of Home: House Home Layout: One level Home Access: Stairs to enter Entrance Stairs-Rails: Right, Left Entrance Stairs-Number of Steps: 3 Home Care Services: No Additional Comments: 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  Discharge Living Setting Plans for Discharge Living Setting: Patient's home, House, Lives with (comment) (Lives with husband and mother-in-law.) Type of Home at Discharge: House Discharge Home Layout: One  level Discharge Home Access: Stairs to enter Entrance Stairs-Number of Steps: 5 steps at back door. 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  Daughter works.  Has friend, Lattie Haw, who may assist some. Caregiver Availability: 24/7 Discharge Plan Discussed with Primary Caregiver: Yes Is Caregiver In Agreement with Plan?: Yes Does Caregiver/Family have Issues with Lodging/Transportation while Pt is in Rehab?: No  Goals/Additional Needs Patient/Family Goal for Rehab: PT/OT/SLP supervision goals Expected length of stay: 10-12 days Cultural Considerations: None Dietary Needs: Dys 3, thin liquids Equipment Needs: TBD Pt/Family Agrees to Admission and willing to participate: Yes Program Orientation Provided & Reviewed with Pt/Caregiver Including Roles  & Responsibilities: Yes  Decrease burden of Care through IP rehab admission: N/A  Possible need for SNF placement upon discharge: Not anticipated  Patient Condition: This patient's medical and functional status has changed since the consult dated: 07/21/16 in which the Rehabilitation Physician determined and documented that the patient's condition is appropriate for intensive rehabilitative care in an inpatient rehabilitation facility. See "History of Present Illness" (above) for medical update. Functional changes are:  Currently requiring min assist to ambulate 50 feet +1 HHA. Patient's medical and functional status update has been discussed with the Rehabilitation physician and patient remains appropriate for inpatient rehabilitation. Will admit to inpatient rehab  today.  Preadmission Screen Completed By:  Retta Diones, 07/25/2016 1:03 PM ______________________________________________________________________   Discussed status with Dr.  Letta Pate on 12/11/17at 1301 and received telephone approval for admission today.  Admission Coordinator:  Retta Diones, time1301/Date12/11/17       Cosigned

## 2016-07-25 NOTE — Clinical Social Work Note (Signed)
Pt is ready for discharge today and will go to CIR. CSW is signing off as no further needs identified.   Darden Dates, MSW, LCSW Clinical Social Worker  [phone]

## 2016-07-25 NOTE — Discharge Instructions (Signed)

## 2016-07-25 NOTE — Progress Notes (Signed)
Rehab admissions - I spoke with two daughters by phone and met briefly with husband, Andrea Kennedy, at the bedside.  Husband can provide supervision after rehab stay.  I spoke with attending MD who has cleared patient for acute inpatient rehab admission for today.  Bed available and will admit to acute inpatient rehab today.  Call me for questions.  #[phone]

## 2016-07-25 NOTE — Progress Notes (Signed)
Charlett Blake, MD Physician Signed Physical Medicine and Rehabilitation  Consult Note Date of Service: 07/21/2016 11:26 AM  Related encounter: ED to Hosp-Admission (Discharged) from 07/20/2016 in [place] All Collapse All   '[]'$ Hide copied text '[]'$ Hover for attribution information      Physical Medicine and Rehabilitation Consult Reason for Consult: Acute moderate nonhemorrhagic left frontal lobe infarct Referring Physician: Dr.Xu   HPI: Andrea Kennedy is a 70 y.o. right handed female with history of lupus, tobacco abuse, right total hip arthroplasty 06/03/2015. Per chart review patient lives with spouse independent prior to admission. One level home with 3 steps to entry. Presented 07/20/2016 with right-sided weakness and aphasia. CT/MRI of the head showed acute moderate nonhemorrhagic left frontal lobe infarct. Patient did receive TPA.. CTA of head and neck with no emergent large vessel occlusion or stenosis. EEG showed focal cerebral dysfunction over the left hemisphere suggestive of underlying structural or physiologic abnormality. No seizure activity noted. Venous Dopplers lower extremities negative for DVT. Neurology consulted maintain on aspirin 325 mg daily for CVA prophylaxis. Subcutaneous Lovenox for DVT prophylaxis. Tolerating a mechanical soft diet. Patient with ongoing bouts of hypertension received intravenous hydralazine. Physical therapy evaluation completed with recommendations of physical medicine rehabilitation consult.  Patient has severe expressive aphasia as well as dysarthria.  Review of Systems  Unable to perform ROS: Language       Past Medical History:  Diagnosis Date  . Lupus   . Sciatica of right side   . Tobacco abuse         Past Surgical History:  Procedure Laterality Date  . TOTAL HIP ARTHROPLASTY Right 06/03/2015   Procedure: RIGHT TOTAL HIP ARTHROPLASTY ANTERIOR APPROACH;  Surgeon: Gaynelle Arabian, MD;  Location: [facility];  Service: Orthopedics;  Laterality: Right;  . TUBAL LIGATION          Family History  Problem Relation Age of Onset  . Breast cancer Mother   . Diabetes Mother   . Heart attack Father   . Fibromyalgia Sister    Social History:  reports that she quit smoking about 12 months ago. She has never used smokeless tobacco. She reports that she does not drink alcohol or use drugs. Allergies:       Allergies  Allergen Reactions  . Floxin [Ofloxacin] Other (See Comments)    Scared/shaky/pain attacks/confusion         Medications Prior to Admission  Medication Sig Dispense Refill  . cyclobenzaprine (FLEXERIL) 5 MG tablet Take 1 tablet (5 mg total) by mouth 3 (three) times daily as needed for muscle spasms. 90 tablet 0  . acetaminophen (TYLENOL) 325 MG tablet Take 650 mg by mouth every 6 (six) hours as needed (for headache.).    Marland Kitchen diphenhydramine-acetaminophen (TYLENOL PM) 25-500 MG TABS tablet Take 1-2 tablets by mouth at bedtime as needed (for sleep).    . docusate sodium (COLACE) 100 MG capsule Take 1 capsule (100 mg total) by mouth 2 (two) times daily. (Patient not taking: Reported on 07/20/2016) 10 capsule 0  . enoxaparin (LOVENOX) 40 MG/0.4ML injection Inject 0.4 mLs (40 mg total) into the skin daily. Continue Lovenox injections for eight more days and then discontinue the Lovenox. Once the patient has completed the blood thinner regimen, then take a Baby 81 mg Aspirin daily for three more weeks. (Patient not taking: Reported on 07/20/2016) 8 Syringe 0  . meloxicam (MOBIC) 7.5 MG tablet Take 1 tablet (7.5  mg total) by mouth daily. (Patient not taking: Reported on 07/20/2016) 30 tablet 0  . Multiple Vitamins-Minerals (ALIVE WOMENS 50+ PO) Take 1 tablet by mouth daily.    . nicotine (NICODERM CQ - DOSED IN MG/24 HOURS) 21 mg/24hr patch Place 1 patch (21 mg total) onto the skin daily. (Patient not taking: Reported on 07/20/2016) 28 patch 0  .  oxyCODONE (OXY IR/ROXICODONE) 5 MG immediate release tablet Take 1-2 tablets (5-10 mg total) by mouth every 4 (four) hours as needed for moderate pain or severe pain ((for MODERATE breakthrough pain)). (Patient not taking: Reported on 07/20/2016) 90 tablet 0  . traMADol (ULTRAM) 50 MG tablet Take 1-2 tablets (50-100 mg total) by mouth every 6 (six) hours as needed (mild pain). 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  Lives With: Spouse  Functional History: Prior Function Level of Independence: Independent Comments: Drives, cooks, cleans, caregiver Functional Status:  Mobility: Bed Mobility Overal bed mobility: Needs Assistance Bed Mobility: Supine to Sit Supine to sit: Supervision, HOB elevated General bed mobility comments: Able to get to EOB without assist, increased time. Not using RUE. Transfers Overall transfer level: Needs assistance Equipment used: None Transfers: Sit to/from Stand, Stand Pivot Transfers Sit to Stand: Min assist Stand pivot transfers: Min assist General transfer comment: Assist to power to standing and for SPT x2 from bed to [facility] and from [facility] to chair. RIght knee instability noted but no buckling. Ambulation/Gait Ambulation/Gait assistance: Min assist Ambulation Distance (Feet): 4 Feet Assistive device: 1 person hand held assist Gait Pattern/deviations: Step-to pattern, Decreased stride length General Gait Details: Guarded gait- Able to take a few steps in room but  reports dizziness so needed to sit. Right knee instability but no buckling. Gait velocity: decreased Gait velocity interpretation: Below normal speed for age/gender    ADL:    Cognition: Cognition Overall Cognitive Status: Difficult to assess Arousal/Alertness: Awake/alert Orientation Level: Oriented X4 Attention: Sustained Sustained Attention: Impaired Sustained Attention Impairment: Verbal basic Memory:  (will assess further in therapy) Awareness: Appears intact Problem Solving: Appears intact Behaviors: Lability Safety/Judgment: Appears intact Cognition Arousal/Alertness: Awake/alert Behavior During Therapy: WFL for tasks assessed/performed Overall Cognitive Status: Difficult to assess Area of Impairment: Following commands Following Commands: Follows multi-step commands consistently General Comments: A&O x4; able to follow simple 1-2 step commands. Some words intelligible, others not. Some right inattention noted.  Difficult to assess due to: Impaired communication  Blood pressure (!) 175/65, pulse (!) 55, temperature 97.4 F (36.3 C), temperature source Oral, resp. rate 14, height '5\' 6"'$  (1.676 m), weight 62.1 kg (136 lb 14.5 oz), SpO2 94 %. Physical Exam  Constitutional: She appears well-developed.  HENT:  Head: Normocephalic.  Eyes: EOM are normal.  Neck: Normal range of motion. Neck supple. No thyromegaly present.  Cardiovascular: Normal rate and regular rhythm.   Respiratory: Effort normal and breath sounds normal. No respiratory distress.  GI: Soft. Bowel sounds are normal. She exhibits no distension. There is no tenderness.  Neurological: She is alert.  Patient aphasic appears to be primarily expressive. She did follow simple commands.  Skin: Skin  is warm and dry.  Is able to answer yes no questions. She is able to follow simple commands. She attempts speaking in longer sentences. This is intelligible Motor strength is 3 minus in the right deltoid, biceps,  triceps, grip, 4 minus in the right hip flexor, knee extensor, 3 at the ankle dorsiflexor, plantar flexor. Right hip range of motion is good internal and external rotation is normal. Left-sided strength is 5/5 in the upper and lower limb Sensation cannot assess secondary to communication issues Lab Results Last 24 Hours       Results for orders placed or performed during the hospital encounter of 07/20/16 (from the past 24 hour(s))  Urine rapid drug screen (hosp performed)not at [facility]     Status: Abnormal   Collection Time: 07/20/16  3:03 PM  Result Value Ref Range   Opiates NONE DETECTED NONE DETECTED   Cocaine POSITIVE (A) NONE DETECTED   Benzodiazepines NONE DETECTED NONE DETECTED   Amphetamines NONE DETECTED NONE DETECTED   Tetrahydrocannabinol NONE DETECTED NONE DETECTED   Barbiturates NONE DETECTED NONE DETECTED  Urinalysis, Routine w reflex microscopic     Status: Abnormal   Collection Time: 07/20/16  3:03 PM  Result Value Ref Range   Color, Urine STRAW (A) YELLOW   APPearance CLEAR CLEAR   Specific Gravity, Urine 1.012 1.005 - 1.030   pH 7.0 5.0 - 8.0   Glucose, UA NEGATIVE NEGATIVE mg/dL   Hgb urine dipstick NEGATIVE NEGATIVE   Bilirubin Urine NEGATIVE NEGATIVE   Ketones, ur NEGATIVE NEGATIVE mg/dL   Protein, ur NEGATIVE NEGATIVE mg/dL   Nitrite NEGATIVE NEGATIVE   Leukocytes, UA NEGATIVE NEGATIVE  CBC     Status: Abnormal   Collection Time: 07/21/16  8:17 AM  Result Value Ref Range   WBC 6.0 4.0 - 10.5 K/uL   RBC 3.64 (L) 3.87 - 5.11 MIL/uL   Hemoglobin 11.9 (L) 12.0 - 15.0 g/dL   HCT 36.4 36.0 - 46.0 %   MCV 100.0 78.0 - 100.0 fL   MCH 32.7 26.0 - 34.0 pg   MCHC 32.7 30.0 - 36.0 g/dL   RDW 14.7 11.5 - 15.5 %   Platelets 278 150 - 400 K/uL  Basic metabolic panel     Status: Abnormal   Collection Time: 07/21/16  8:17 AM  Result Value Ref Range   Sodium 139 135 - 145 mmol/L   Potassium 3.8 3.5 - 5.1 mmol/L   Chloride 105 101 - 111  mmol/L   CO2 24 22 - 32 mmol/L   Glucose, Bld 105 (H) 65 - 99 mg/dL   BUN 6 6 - 20 mg/dL   Creatinine, Ser 0.85 0.44 - 1.00 mg/dL   Calcium 9.2 8.9 - 10.3 mg/dL   GFR calc non Af Amer >60 >60 mL/min   GFR calc Af Amer >60 >60 mL/min   Anion gap 10 5 - 15  TSH     Status: None   Collection Time: 07/21/16  8:17 AM  Result Value Ref Range   TSH 2.223 0.350 - 4.500 uIU/mL      Imaging Results (Last 48 hours)  Ct Angio Head W Or Wo Contrast  Result Date: 07/20/2016 CLINICAL DATA:  Code stroke. Last seen normal at 1 15 a.m. RIGHT-sided weakness, RIGHT facial droop and aphasia. EXAM: CT ANGIOGRAPHY HEAD AND NECK TECHNIQUE: Multidetector CT imaging of the head and neck was performed using the standard protocol during bolus administration of intravenous contrast. Multiplanar CT image reconstructions and MIPs were  obtained to evaluate the vascular anatomy. Carotid stenosis measurements (when applicable) are obtained utilizing NASCET criteria, using the distal internal carotid diameter as the denominator. CONTRAST:  50 cc Isovue 370 COMPARISON:  CT HEAD July 20, 2016 at 0245 hours FINDINGS: CTA NECK AORTIC ARCH: Normal appearance of the thoracic arch, 2 vessel arch is a normal variant. Mild calcific atherosclerosis. The origins of the innominate, left Common carotid artery and subclavian artery are widely patent. RIGHT CAROTID SYSTEM: Common carotid artery is widely patent, coursing in a straight line fashion. Normal appearance of the carotid bifurcation without hemodynamically significant stenosis by NASCET criteria. Mild eccentric calcific atherosclerosis. Normal appearance of the included internal carotid artery. LEFT CAROTID SYSTEM: Common carotid artery is widely patent, coursing in a straight line fashion. Focal eccentric luminal regularity LEFT Common carotid artery origin of attributed to atherosclerosis. Normal appearance of the carotid bifurcation without hemodynamically significant  stenosis by NASCET criteria. Moderate eccentric calcific atherosclerosis. Normal appearance of the included internal carotid artery. VERTEBRAL ARTERIES:Left vertebral artery is dominant. Moderate stenosis LEFT vertebral artery origin due to eccentric calcific atherosclerosis. Codominant vertebral arteries. Mild extrinsic deformity due to degenerative cervical spine. SKELETON: No acute osseous process though bone windows have not been submitted. Scattered dental caries. Severe C5-6 and C6-7 degenerative discs. Severe mid cervical facet arthropathy. OTHER NECK: Soft tissues of the neck are non-acute though, not tailored for evaluation. Moderate centrilobular emphysema. CTA HEAD ANTERIOR CIRCULATION: Normal appearance of the cervical internal carotid arteries, petrous, cavernous and supra clinoid internal carotid arteries. Widely patent anterior communicating artery. Mild stenosis mid LEFT M1 segment and mild stenosis proximal LEFT M2 segments. Mild luminal regularity of the anterior and middle cerebral arteries. No large vessel occlusion, hemodynamically significant stenosis, dissection, luminal irregularity, contrast extravasation or aneurysm. POSTERIOR CIRCULATION: Normal appearance of the vertebral arteries, vertebrobasilar junction and basilar artery, as well as main branch vessels. Patent posterior cerebral arteries with moderate luminal irregularity. No large vessel occlusion, hemodynamically significant stenosis, dissection, contrast extravasation or aneurysm. VENOUS SINUSES: Major dural venous sinuses are patent though not tailored for evaluation on this angiographic examination. ANATOMIC VARIANTS: None. DELAYED PHASE: Not performed. IMPRESSION: CTA NECK: Moderate stenosis LEFT vertebral artery origin without hemodynamically significant stenosis by NASCET criteria. CTA HEAD: No emergent large vessel occlusion or high-grade stenosis. Intracranial atherosclerosis, most apparent within the posterior circulation  resulting in moderate luminal irregularity. Acute findings discussed with and reconfirmed by [facility], Neurology on 07/20/2016 at 3:25 am. Electronically Signed   By: Elon Alas M.D.   On: 07/20/2016 03:26   Ct Head Wo Contrast  Result Date: 07/20/2016 CLINICAL DATA:  Unequal pupils, unable to kick RIGHT leg. Previous code stroke with RIGHT-sided weakness. Altered mental status. EXAM: CT HEAD WITHOUT CONTRAST TECHNIQUE: Contiguous axial images were obtained from the base of the skull through the vertex without intravenous contrast. COMPARISON:  CT HEAD July 20, 2016 at 0258 hours FINDINGS: BRAIN: The ventricles and sulci are normal for age. No intraparenchymal hemorrhage, mass effect nor midline shift. Patchy supratentorial white matter hypodensities within normal range for patient's age, though non-specific are most compatible with chronic small vessel ischemic disease. Focal blurring of LEFT frontal gray-white matter differentiation, slightly propagated from prior CT. No abnormal extra-axial fluid collections. Basal cisterns are patent. VASCULAR: Moderate calcific atherosclerosis of the carotid siphons. SKULL: No skull fracture. No significant scalp soft tissue swelling. SINUSES/ORBITS: The mastoid air-cells and included paranasal sinuses are well-aerated.The included ocular globes and orbital contents are non-suspicious. OTHER: None. IMPRESSION: Minimal propagation  of LEFT frontal lobe acute infarct. No hemorrhagic conversion. Otherwise stable examination including moderate chronic small vessel ischemic disease. Electronically Signed   By: Elon Alas M.D.   On: 07/20/2016 06:28   Ct Angio Neck W Or Wo Contrast  Result Date: 07/20/2016 CLINICAL DATA:  Code stroke. Last seen normal at 1 15 a.m. RIGHT-sided weakness, RIGHT facial droop and aphasia. EXAM: CT ANGIOGRAPHY HEAD AND NECK TECHNIQUE: Multidetector CT imaging of the head and neck was performed using the standard protocol during  bolus administration of intravenous contrast. Multiplanar CT image reconstructions and MIPs were obtained to evaluate the vascular anatomy. Carotid stenosis measurements (when applicable) are obtained utilizing NASCET criteria, using the distal internal carotid diameter as the denominator. CONTRAST:  50 cc Isovue 370 COMPARISON:  CT HEAD July 20, 2016 at 0245 hours FINDINGS: CTA NECK AORTIC ARCH: Normal appearance of the thoracic arch, 2 vessel arch is a normal variant. Mild calcific atherosclerosis. The origins of the innominate, left Common carotid artery and subclavian artery are widely patent. RIGHT CAROTID SYSTEM: Common carotid artery is widely patent, coursing in a straight line fashion. Normal appearance of the carotid bifurcation without hemodynamically significant stenosis by NASCET criteria. Mild eccentric calcific atherosclerosis. Normal appearance of the included internal carotid artery. LEFT CAROTID SYSTEM: Common carotid artery is widely patent, coursing in a straight line fashion. Focal eccentric luminal regularity LEFT Common carotid artery origin of attributed to atherosclerosis. Normal appearance of the carotid bifurcation without hemodynamically significant stenosis by NASCET criteria. Moderate eccentric calcific atherosclerosis. Normal appearance of the included internal carotid artery. VERTEBRAL ARTERIES:Left vertebral artery is dominant. Moderate stenosis LEFT vertebral artery origin due to eccentric calcific atherosclerosis. Codominant vertebral arteries. Mild extrinsic deformity due to degenerative cervical spine. SKELETON: No acute osseous process though bone windows have not been submitted. Scattered dental caries. Severe C5-6 and C6-7 degenerative discs. Severe mid cervical facet arthropathy. OTHER NECK: Soft tissues of the neck are non-acute though, not tailored for evaluation. Moderate centrilobular emphysema. CTA HEAD ANTERIOR CIRCULATION: Normal appearance of the cervical internal  carotid arteries, petrous, cavernous and supra clinoid internal carotid arteries. Widely patent anterior communicating artery. Mild stenosis mid LEFT M1 segment and mild stenosis proximal LEFT M2 segments. Mild luminal regularity of the anterior and middle cerebral arteries. No large vessel occlusion, hemodynamically significant stenosis, dissection, luminal irregularity, contrast extravasation or aneurysm. POSTERIOR CIRCULATION: Normal appearance of the vertebral arteries, vertebrobasilar junction and basilar artery, as well as main branch vessels. Patent posterior cerebral arteries with moderate luminal irregularity. No large vessel occlusion, hemodynamically significant stenosis, dissection, contrast extravasation or aneurysm. VENOUS SINUSES: Major dural venous sinuses are patent though not tailored for evaluation on this angiographic examination. ANATOMIC VARIANTS: None. DELAYED PHASE: Not performed. IMPRESSION: CTA NECK: Moderate stenosis LEFT vertebral artery origin without hemodynamically significant stenosis by NASCET criteria. CTA HEAD: No emergent large vessel occlusion or high-grade stenosis. Intracranial atherosclerosis, most apparent within the posterior circulation resulting in moderate luminal irregularity. Acute findings discussed with and reconfirmed by [facility], Neurology on 07/20/2016 at 3:25 am. Electronically Signed   By: Elon Alas M.D.   On: 07/20/2016 03:26   Mr Brain Wo Contrast  Result Date: 07/21/2016 CLINICAL DATA:  Acute onset RIGHT hemiplegia and aphasia. Follow-up stroke. EXAM: MRI HEAD WITHOUT CONTRAST TECHNIQUE: Multiplanar, multiecho pulse sequences of the brain and surrounding structures were obtained without intravenous contrast. COMPARISON:  CT HEAD July 20, 2016 FINDINGS: BRAIN: Moderate at least 4 cm area of reduced diffusion LEFT posterior frontal lobe larger distribution  on prior CT. Discrete small area mesial LEFT frontal lobe cortical reduced diffusion, all  regions demonstrate low ADC values. No susceptibility artifact to suggest hemorrhage. The ventricles and sulci are normal for patient's age. Patchy FLAIR T2 hyperintense signal exclusive of the aforementioned abnormality. No suspicious parenchymal signal, masses or mass effect. No abnormal extra-axial fluid collections. No extra-axial masses though, contrast enhanced sequences would be more sensitive. VASCULAR: Normal major intracranial vascular flow voids present at skull base. SKULL AND UPPER CERVICAL SPINE: No abnormal sellar expansion. No suspicious calvarial bone marrow signal. Craniocervical junction maintained. SINUSES/ORBITS: Small LEFT mastoid effusion. The included ocular globes and orbital contents are non-suspicious. OTHER: None. IMPRESSION: Acute moderate nonhemorrhagic LEFT frontal lobe infarct (predominantly MCA and minimal ACA territories). Mild to moderate chronic small vessel ischemic disease. Electronically Signed   By: Elon Alas M.D.   On: 07/21/2016 04:35   Dg Swallowing Func-speech Pathology  Result Date: 07/20/2016 Objective Swallowing Evaluation: Type of Study: MBS-Modified Barium Swallow Study Patient Details Name: Andrea Kennedy MRN: [id number] Date of Birth: 1945/10/07 Today's Date: 07/20/2016 Time: SLP Start Time (ACUTE ONLY): 1305-SLP Stop Time (ACUTE ONLY): 1322 SLP Time Calculation (min) (ACUTE ONLY): 17 min Past Medical History: Past Medical History: Diagnosis Date . Lupus  . Sciatica of right side  . Tobacco abuse  Past Surgical History: Past Surgical History: Procedure Laterality Date . TOTAL HIP ARTHROPLASTY Right 06/03/2015  Procedure: RIGHT TOTAL HIP ARTHROPLASTY ANTERIOR APPROACH;  Surgeon: Gaynelle Arabian, MD;  Location: [facility];  Service: Orthopedics;  Laterality: Right; . TUBAL LIGATION   HPI: Lamees Gable a 70 y.o.femalewho presents to the Emergency Department with right sided weakness, right sided facial droop, right neglect and aphasia. CT minimal propagation  of LEFT frontal lobe acute infarct. No hemorrhagic conversion. No Data Recorded Assessment / Plan / Recommendation CHL IP CLINICAL IMPRESSIONS 07/20/2016 Therapy Diagnosis -- Clinical Impression Mild oropharyngeal dysphagia marked by decreased bolus cohesion resulting in premature spill to valleculae with thin x 1 and once with oral residue to pyriform sinuses. Trace and flash penetration x 1 with straw. Recommend Dys 3 texture, thin liquids, small straw sips allowed, pills whole in applesauce.   Impact on safety and function --   CHL IP TREATMENT RECOMMENDATION 07/20/2016 Treatment Recommendations Therapy as outlined in treatment plan below   Prognosis 07/20/2016 Prognosis for Safe Diet Advancement Good Barriers to Reach Goals -- Barriers/Prognosis Comment -- CHL IP DIET RECOMMENDATION 07/20/2016 SLP Diet Recommendations Dysphagia 3 (Mech soft) solids;Thin liquid Liquid Administration via Cup;Straw Medication Administration Whole meds with puree Compensations Slow rate;Small sips/bites Postural Changes Seated upright at 90 degrees   CHL IP OTHER RECOMMENDATIONS 07/20/2016 Recommended Consults -- Oral Care Recommendations Oral care BID Other Recommendations --   CHL IP FOLLOW UP RECOMMENDATIONS 07/20/2016 Follow up Recommendations Inpatient Rehab   CHL IP FREQUENCY AND DURATION 07/20/2016 Speech Therapy Frequency (ACUTE ONLY) min 2x/week Treatment Duration 2 weeks      CHL IP ORAL PHASE 07/20/2016 Oral Phase Impaired Oral - Pudding Teaspoon -- Oral - Pudding Cup -- Oral - Honey Teaspoon -- Oral - Honey Cup -- Oral - Nectar Teaspoon -- Oral - Nectar Cup -- Oral - Nectar Straw -- Oral - Thin Teaspoon -- Oral - Thin Cup Premature spillage;Piecemeal swallowing Oral - Thin Straw Piecemeal swallowing Oral - Puree Piecemeal swallowing Oral - Mech Soft -- Oral - Regular Delayed oral transit Oral - Multi-Consistency -- Oral - Pill -- Oral Phase - Comment --  CHL IP PHARYNGEAL PHASE 07/20/2016 Pharyngeal  Phase Impaired Pharyngeal-  Pudding Teaspoon -- Pharyngeal -- Pharyngeal- Pudding Cup -- Pharyngeal -- Pharyngeal- Honey Teaspoon -- Pharyngeal -- Pharyngeal- Honey Cup -- Pharyngeal -- Pharyngeal- Nectar Teaspoon -- Pharyngeal -- Pharyngeal- Nectar Cup -- Pharyngeal -- Pharyngeal- Nectar Straw -- Pharyngeal -- Pharyngeal- Thin Teaspoon -- Pharyngeal -- Pharyngeal- Thin Cup WFL Pharyngeal -- Pharyngeal- Thin Straw Penetration/Aspiration during swallow;Delayed swallow initiation-pyriform sinuses Pharyngeal Material enters airway, remains ABOVE vocal cords then ejected out Pharyngeal- Puree Pharyngeal residue - valleculae;Reduced tongue base retraction Pharyngeal -- Pharyngeal- Mechanical Soft -- Pharyngeal -- Pharyngeal- Regular WFL Pharyngeal -- Pharyngeal- Multi-consistency -- Pharyngeal -- Pharyngeal- Pill -- Pharyngeal -- Pharyngeal Comment --  CHL IP CERVICAL ESOPHAGEAL PHASE 07/20/2016 Cervical Esophageal Phase WFL Pudding Teaspoon -- Pudding Cup -- Honey Teaspoon -- Honey Cup -- Nectar Teaspoon -- Nectar Cup -- Nectar Straw -- Thin Teaspoon -- Thin Cup -- Thin Straw -- Puree -- Mechanical Soft -- Regular -- Multi-consistency -- Pill -- Cervical Esophageal Comment -- No flowsheet data found. [place] Siren 07/20/2016, 3:08 PM Orbie Pyo Colvin Caroli.Ed CCC-SLP Pager [phone]              Ct Head Code Stroke Wo Contrast`  Result Date: 07/20/2016 CLINICAL DATA:  Code stroke. Last seen normal at 1 15 a.m.: RIGHT-sided weakness, RIGHT facial droop and aphasia. EXAM: CT HEAD WITHOUT CONTRAST TECHNIQUE: Contiguous axial images were obtained from the base of the skull through the vertex without intravenous contrast. COMPARISON:  None. FINDINGS: BRAIN: The ventricles and sulci are normal for age. No intraparenchymal hemorrhage, mass effect nor midline shift. Patchy supratentorial white matter hypodensities within normal range for patient's age, though non-specific are most compatible with chronic small vessel ischemic disease. Small area  of focal blurring LEFT posterior frontal lobe/ precentral gyrus. No abnormal extra-axial fluid collections. Basal cisterns are patent. VASCULAR: Moderate calcific atherosclerosis of the carotid siphons. SKULL: No skull fracture. No significant scalp soft tissue swelling. SINUSES/ORBITS: The mastoid air-cells and included paranasal sinuses are well-aerated.The included ocular globes and orbital contents are non-suspicious. OTHER: None. ASPECTS [facility] Stroke Program Early CT Score) - Ganglionic level infarction (caudate, lentiform nuclei, internal capsule, insula, M1-M3 cortex): 7 - Supraganglionic infarction (M4-M6 cortex): 6 Total score (0-10 with 10 being normal): 9 IMPRESSION: 1. Small suspected acute LEFT posterior frontal lobe infarct. Moderate chronic small vessel ischemic disease. 2. ASPECTS is 9. Critical Value/emergent results were called by telephone at the time of interpretation on 07/20/2016 at 2:58 am to Dr. Cheral Marker, Neurology, who verbally acknowledged these results. Electronically Signed   By: Elon Alas M.D.   On: 07/20/2016 02:59     Assessment/Plan: Diagnosis: Right hemiparesis and aphasia secondary to left frontal infarct 1. Does the need for close, 24 hr/day medical supervision in concert with the patient's rehab needs make it unreasonable for this patient to be served in a less intensive setting? Yes 2. Co-Morbidities requiring supervision/potential complications: Cocaine abuse, tobacco abuse, lupus, uncontrolled hypertension 3. Due to bladder management, bowel management, safety, skin/wound care, disease management, medication administration, pain management and patient education, does the patient require 24 hr/day rehab nursing? Yes 4. Does the patient require coordinated care of a physician, rehab nurse, PT (1-2 hrs/day, 5 days/week), OT (1-2 hrs/day, 5 days/week) and SLP (.5-1 hrs/day, 5 days/week) to address physical and functional deficits in the context of the above  medical diagnosis(es)? Yes Addressing deficits in the following areas: balance, endurance, locomotion, strength, transferring, bowel/bladder control, bathing, dressing, feeding, grooming, toileting, cognition, speech, language and psychosocial  support 5. Can the patient actively participate in an intensive therapy program of at least 3 hrs of therapy per day at least 5 days per week? Yes 6. The potential for patient to make measurable gains while on inpatient rehab is excellent 7. Anticipated functional outcomes upon discharge from inpatient rehab are supervision  with PT, supervision with OT, supervision with SLP. 8. Estimated rehab length of stay to reach the above functional goals is: 10-12 d 9. Does the patient have adequate social supports and living environment to accommodate these discharge functional goals? Yes 10. Anticipated D/C setting: Home 11. Anticipated post D/C treatments: Outpatient therapy 12. Overall Rehab/Functional Prognosis: excellent  RECOMMENDATIONS: This patient's condition is appropriate for continued rehabilitative care in the following setting: CIR Patient has agreed to participate in recommended program. Potentially Note that insurance prior authorization may be required for reimbursement for recommended care.  Comment: Needs to control blood pressure off IV hydralazine  Dan Syracuse PA  07/21/2016

## 2016-07-25 NOTE — Progress Notes (Signed)
Initial Nutrition Assessment   INTERVENTION:  Provide snacks TID Provide Multivitamin with minerals daily Provided and discussed "Heart Healthy Nutrition Therapy" handout from the Academy of Nutrition and Dietetics   NUTRITION DIAGNOSIS:   Inadequate oral intake related to dysphagia as evidenced by per patient/family report, meal completion < 50%.   GOAL:   Patient will meet greater than or equal to 90% of their needs   MONITOR:   PO intake, Labs, Weight trends, Skin, I & O's  REASON FOR ASSESSMENT:   Consult Assessment of nutrition requirement/status  ASSESSMENT:   70 y.o. female who presents with acute onset of right hemiplegia and aphasia.  Pt reports having difficulty chewing and eating about 50% less than usual since admission. She states that she was eating well PTA and maintaining weight around 140 lbs. She states that 17 years ago she weighed 235 lbs. She reports exercising every day PTA. She states that her husband and mother have also had strokes, but don't eat healthy and she ends up eating what they eat. Provided and discussed "Heart Healthy Nutrition Therapy" handout from the Academy of Nutrition and Dietetics. RD encouraged intake of fruits and vegetables at every meals, whole grains, nuts, and healthy/unsaturated oils. Pt is NPO today for scheduled TEE this afternoon; she is hungry and upset that she is not allowed to eat.   Labs reviewed.  Diet Order:  DIET DYS 3 Room service appropriate? Yes; Fluid consistency: Thin  Skin:  Reviewed, no issues  Last BM:  12/10  Height:   Ht Readings from Last 1 Encounters:  07/22/16 '5\' 8"'$  (1.727 m)    Weight:   Wt Readings from Last 1 Encounters:  07/22/16 135 lb 1.6 oz (61.3 kg)    Ideal Body Weight:  63.6 kg  BMI:  Body mass index is 20.54 kg/m.  Estimated Nutritional Needs:   Kcal:  1600-1800  Protein:  70-80 grams  Fluid:  1.8 L/day  EDUCATION NEEDS:   Education needs addressed  Scarlette Ar RD, CSP, LDN Inpatient Clinical Dietitian Pager: [phone] After Hours Pager: [phone]

## 2016-07-25 NOTE — Care Management Important Message (Signed)
Important Message  Patient Details  Name: Andrea Kennedy MRN: [id number] Date of Birth: June 16, 1946   Medicare Important Message Given:  Yes    [name] Circle 07/25/2016, 11:31 AM

## 2016-07-25 NOTE — Progress Notes (Signed)
Patient arrived from 54M via transport chair.

## 2016-07-26 ENCOUNTER — Inpatient Hospital Stay (HOSPITAL_COMMUNITY): Payer: Medicare HMO | Admitting: Speech Pathology

## 2016-07-26 ENCOUNTER — Inpatient Hospital Stay (HOSPITAL_COMMUNITY): Payer: Medicare HMO | Admitting: Occupational Therapy

## 2016-07-26 ENCOUNTER — Inpatient Hospital Stay (HOSPITAL_COMMUNITY): Payer: Medicare HMO | Admitting: Physical Therapy

## 2016-07-26 DIAGNOSIS — G8194 Hemiplegia, unspecified affecting left nondominant side: Secondary | ICD-10-CM

## 2016-07-26 DIAGNOSIS — R41844 Frontal lobe and executive function deficit: Secondary | ICD-10-CM

## 2016-07-26 LAB — CBC WITH DIFFERENTIAL/PLATELET
BASOS PCT: 1 %
Basophils Absolute: 0 10*3/uL (ref 0.0–0.1)
EOS ABS: 0.2 10*3/uL (ref 0.0–0.7)
EOS PCT: 5 %
HCT: 34.2 % — ABNORMAL LOW (ref 36.0–46.0)
Hemoglobin: 11.3 g/dL — ABNORMAL LOW (ref 12.0–15.0)
Lymphocytes Relative: 28 %
Lymphs Abs: 1.4 10*3/uL (ref 0.7–4.0)
MCH: 33.3 pg (ref 26.0–34.0)
MCHC: 33 g/dL (ref 30.0–36.0)
MCV: 100.9 fL — ABNORMAL HIGH (ref 78.0–100.0)
MONO ABS: 0.6 10*3/uL (ref 0.1–1.0)
MONOS PCT: 12 %
Neutro Abs: 2.8 10*3/uL (ref 1.7–7.7)
Neutrophils Relative %: 54 %
Platelets: 229 10*3/uL (ref 150–400)
RBC: 3.39 MIL/uL — ABNORMAL LOW (ref 3.87–5.11)
RDW: 14.5 % (ref 11.5–15.5)
WBC: 5.1 10*3/uL (ref 4.0–10.5)

## 2016-07-26 LAB — COMPREHENSIVE METABOLIC PANEL
ALBUMIN: 3.4 g/dL — AB (ref 3.5–5.0)
ALT: 15 U/L (ref 14–54)
ANION GAP: 6 (ref 5–15)
AST: 22 U/L (ref 15–41)
Alkaline Phosphatase: 38 U/L (ref 38–126)
BILIRUBIN TOTAL: 0.6 mg/dL (ref 0.3–1.2)
BUN: 18 mg/dL (ref 6–20)
CO2: 27 mmol/L (ref 22–32)
Calcium: 9 mg/dL (ref 8.9–10.3)
Chloride: 106 mmol/L (ref 101–111)
Creatinine, Ser: 1.11 mg/dL — ABNORMAL HIGH (ref 0.44–1.00)
GFR calc Af Amer: 58 mL/min — ABNORMAL LOW (ref >60)
GFR calc non Af Amer: 50 mL/min — ABNORMAL LOW (ref >60)
GLUCOSE: 100 mg/dL — AB (ref 65–99)
POTASSIUM: 3.9 mmol/L (ref 3.5–5.1)
SODIUM: 139 mmol/L (ref 135–145)
TOTAL PROTEIN: 5.6 g/dL — AB (ref 6.5–8.1)

## 2016-07-26 NOTE — Evaluation (Signed)
Occupational Therapy Assessment and Plan  Patient Details  Name: Andrea Kennedy MRN: [id number] Date of Birth: [birth year]  OT Diagnosis: flaccid hemiplegia and hemiparesis, hemiplegia affecting dominant side and muscle weakness (generalized) Rehab Potential: Rehab Potential (ACUTE ONLY): Good ELOS: 7-10 days   Today's Date: 07/26/2016 OT Individual Time: 8270-7867 OT Individual Time Calculation (min): 57 min      Problem List: Patient Active Problem List   Diagnosis Date Noted  . Frontal lobe deficit 07/25/2016  . Right hemiparesis ([place])   . Dysarthria, post-stroke   . Benign essential HTN   . Pure hypercholesterolemia   . Tobacco abuse   . AKI (acute kidney injury) ([place])   . Paroxysmal atrial fibrillation ([facility])   . Cocaine abuse   . Hyperlipidemia   . Stroke ([place]) 07/20/2016  . Stroke (cerebrum) ([place]) - L frontal embolic s/p IV tPA, d/t AF 07/20/2016  . Alteration in neurological status   . Postoperative anemia due to acute blood loss 06/05/2015  . Hip fracture, right ([place])   . Fracture of hip, right, closed ([place]) 06/03/2015  . Fall 06/03/2015  . Leukocytosis 06/03/2015  . Lupus   . Smoker   . Sciatica of right side     Past Medical History:  Past Medical History:  Diagnosis Date  . Lupus   . Sciatica of right side   . Tobacco abuse    Past Surgical History:  Past Surgical History:  Procedure Laterality Date  . TOTAL HIP ARTHROPLASTY Right 06/03/2015   Procedure: RIGHT TOTAL HIP ARTHROPLASTY ANTERIOR APPROACH;  Surgeon: Gaynelle Arabian, MD;  Location: [facility];  Service: Orthopedics;  Laterality: Right;  . TUBAL LIGATION      Assessment & Plan Clinical Impression: Patient is a 70 y.o. right handed femalewith history of lupus, tobacco abuse, right total hip arthroplasty 06/03/2015 and was discharged to [facility]. Per chart review and patient, patient lives with spouse independent prior to admission. One level home with 3 steps to entry. Presented 07/20/2016  with right-sided weakness and aphasia.Urine drug screen positive for cocaine. CT/MRI of the head showed acute moderate nonhemorrhagic left frontal lobe infarct. Patient did receive TPA.. CTA of head and neck with no emergent large vessel occlusion or stenosis. EEG showed focal cerebral dysfunction over the left hemisphere suggestive of underlying structural or physiologic abnormality. No seizure activity noted. Venous Dopplers lower extremities negative for DVT.Echocardiogram with ejection fraction of 55%. Systolic function was normal. Neurology consulted maintained on Plavix and aspirin 325 mg daily for CVA prophylaxis. Subcutaneous Lovenox for DVT prophylaxis. Patient maintained on telemetry showing rapid irregular heart rate that appeared consistent with atrial fibrillation. Cardiology service is consulted advised Eliquis and aspirin/Plavix as well as Lovenox were discontinued at that time. Tolerating a mechanical soft diet. Patient with ongoing bouts Elevated blood pressure and received intravenous hydralazine.Consideration was made for TEE however it was canceled by as neurology services did not feel any further workup is needed. Physical and occupational therapy evaluations completed with recommendations of physical medicine rehabilitation consult.   Patient transferred to CIR on 07/25/2016 .    Patient currently requires min with basic self-care skills secondary to muscle weakness, impaired timing and sequencing, abnormal tone and decreased coordination, decreased attention to right and hemiplegia.  Prior to hospitalization, patient could complete ADLs with independent .  Patient will benefit from skilled intervention to increase independence with basic self-care skills prior to discharge home with care partner.  Anticipate patient will require intermittent supervision and follow up outpatient.  OT - End of Session Activity Tolerance: Tolerates 30+ min activity without fatigue Endurance Deficit:  No OT Assessment Rehab Potential (ACUTE ONLY): Good OT Patient demonstrates impairments in the following area(s): Balance;Endurance;Motor;Perception;Safety OT Basic ADL's Functional Problem(s): Grooming;Bathing;Dressing;Toileting;Eating OT Advanced ADL's Functional Problem(s): Simple Meal Preparation;Laundry OT Transfers Functional Problem(s): Toilet;Tub/Shower OT Additional Impairment(s): Fuctional Use of Upper Extremity OT Plan OT Intensity: Minimum of 1-2 x/day, 45 to 90 minutes OT Frequency: 5 out of 7 days OT Duration/Estimated Length of Stay: 7-10 days OT Treatment/Interventions: Balance/vestibular training;Community reintegration;Discharge planning;Disease mangement/prevention;DME/adaptive equipment instruction;Functional electrical stimulation;Functional mobility training;Neuromuscular re-education;Pain management;Patient/family education;Psychosocial support;Self Care/advanced ADL retraining;Skin care/wound managment;Therapeutic Activities;UE/LE Strength taining/ROM;Therapeutic Exercise;UE/LE Coordination activities OT Self Feeding Anticipated Outcome(s): Supervision/setup OT Basic Self-Care Anticipated Outcome(s): Mod I OT Toileting Anticipated Outcome(s): Mod I OT Bathroom Transfers Anticipated Outcome(s): Mod I OT Recommendation Patient destination: Home Follow Up Recommendations: Outpatient OT Equipment Recommended: To be determined   Skilled Therapeutic Intervention OT eval completed with discussion of rehab process, OT purpose, POC, ELOS, and goals.  ADL assessment completed with bathing and dressing at sit > stand level.  Bathing completed in room shower with pt completing sit > stand with supervision.  LB dressing completed at sit > stand level, after pt attempted to thread pant legs in standing but opting to sit for increased safety due to decreased balance reactions.  Required assist to fasten bra due to decreased functional use of RUE with no active grasp.  Grooming tasks  completed in standing with setup only to open containers and assist with applying toothpaste.  Reported need to toilet, completed all toileting tasks and transfer at supervision level.  Returned to bed and left semi-reclined in bed with bed alarm on due to impulsivity.  OT Evaluation Precautions/Restrictions  Precautions Precautions: Fall Precaution Comments: hemiparesis RUE>RLE, mild unsteadiness with gait Restrictions Weight Bearing Restrictions: No Pain Pain Assessment Pain Assessment: No/denies pain Home Living/Prior Functioning Home Living Available Help at Discharge: Family, Available 24 hours/day Type of Home: House Home Access: Stairs to enter [employer] of Steps: 3 Entrance Stairs-Rails: Right, Left, Can reach both Home Layout: One level Bathroom Shower/Tub: Tub/shower unit (has grab bars and hand held shower.  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 ADL   See Function Navigator Vision/Perception  Vision- History Baseline Vision/History:  (reports used to wear bifocals but doesn't anymore) Patient Visual Report: No change from baseline Vision- Assessment Vision Assessment?: Yes Eye Alignment: Within Functional Limits Ocular Range of Motion: Within Functional Limits Alignment/Gaze Preference: Within Defined Limits Tracking/Visual Pursuits: Able to track stimulus in all quads without  difficulty Saccades: Decreased speed of saccadic movement (impaired scanning back to Rt) Visual Fields: No apparent deficits Depth Perception: Overshoots Perception Comments: mild inattention to RUE when not in use during functional tasks  Cognition Overall Cognitive Status: Impaired/Different from baseline Arousal/Alertness: Awake/alert Orientation Level: Person;Place;Situation Person: Oriented Place: Oriented Situation: Oriented Year: 2017 Month: December Day of Week: Correct Immediate Memory Recall: Sock;Blue;Bed Memory Recall: Sock;Bed;Blue Memory Recall Sock: Without Cue Memory Recall Blue: With Cue  Memory Recall Bed: Without Cue Attention: Alternating Sustained Attention: Appears intact Selective Attention: Appears intact Awareness: Appears intact Safety/Judgment: Appears intact Sensation Sensation Light Touch: Appears Intact Stereognosis: Impaired by gross assessment (reports tingling and numbness) Coordination Gross Motor Movements are Fluid and Coordinated: No Fine Motor Movements are Fluid and Coordinated: No Finger Nose Finger Test: WNL on L, impaired on R but able to bring hand to nose and to therapist's hand, overshooting Heel Shin Test: WNL on L, decreased ROM on R but coordination intact Motor  Motor Motor: Other (comment) Motor - Skilled Clinical Observations: R hemiparesis, UE>LE Mobility  Bed Mobility Bed Mobility: Supine to Sit Supine to Sit: 5: Supervision;HOB elevated Transfers Sit to Stand: 5: Supervision Stand to Sit: 5: Supervision  Trunk/Postural Assessment  Cervical Assessment Cervical Assessment: Within Functional Limits Thoracic Assessment Thoracic Assessment: Within Functional Limits Lumbar Assessment Lumbar Assessment: Within Functional Limits Postural Control Postural Control: Within Functional Limits  Balance Balance Balance Assessed: Yes Dynamic Sitting Balance Dynamic Sitting - Balance Support: No upper extremity  supported;Feet supported;During functional activity Dynamic Sitting - Level of Assistance: 5: Stand by assistance Sitting balance - Comments: dressing sitting EOB Static Standing Balance Static Standing - Balance Support: No upper extremity supported;During functional activity Static Standing - Level of Assistance: 5: Stand by assistance Dynamic Standing Balance Dynamic Standing - Balance Support: No upper extremity supported;During functional activity Dynamic Standing - Level of Assistance: 4: Min assist Extremity/Trunk Assessment RUE Assessment RUE Assessment: Exceptions to [facility] RUE AROM (degrees) Overall AROM Right Upper Extremity: Within functional limits for tasks performed RUE Overall AROM Comments: Pt demonstrated ROM WFL, however with decreased ability to incorporate into functional tasks RUE Strength RUE Overall Strength: Deficits RUE Overall Strength Comments: strength grossly 4/5 at shoulder and elbow, 3-/5 wrist, no finger flexion/extension or gross grasp LUE Assessment LUE Assessment: Within Functional Limits   See Function Navigator for Current Functional Status.   Refer to Care Plan for Long Term Goals  Recommendations for other services: None  and Therapeutic Recreation  Kitchen group, Stress management and Outing/community reintegration   Discharge Criteria: Patient will be discharged from OT if patient refuses treatment 3 consecutive times without medical reason, if treatment goals not met, if there is a change in medical status, if patient makes no progress towards goals or if patient is discharged from hospital.  The above assessment, treatment plan, treatment alternatives and goals were discussed and mutually agreed upon: by patient  Simonne Come 07/26/2016, 12:16 PM

## 2016-07-26 NOTE — Care Management Note (Signed)
[facility] Individual Statement of Services  Patient Name:  Andrea Kennedy  Date:  07/26/2016  Welcome to the [facility].  Our goal is to provide you with an individualized program based on your diagnosis and situation, designed to meet your specific needs.  With this comprehensive rehabilitation program, you will be expected to participate in at least 3 hours of rehabilitation therapies Monday-Friday, with modified therapy programming on the weekends.  Your rehabilitation program will include the following services:  Physical Therapy (PT), Occupational Therapy (OT), Speech Therapy (ST), 24 hour per day rehabilitation nursing, Therapeutic Recreaction (TR), Neuropsychology, Case Management (Social Worker), Rehabilitation Medicine, Nutrition Services and Pharmacy Services  Weekly team conferences will be held on Wednesday to discuss your progress.  Your Social Worker will talk with you frequently to get your input and to update you on team discussions.  Team conferences with you and your family in attendance may also be held.  Expected length of stay: 7-10 days Overall anticipated outcome: mod/i level  Depending on your progress and recovery, your program may change. Your Social Worker will coordinate services and will keep you informed of any changes. Your Social Worker's name and contact numbers are listed  below.  The following services may also be recommended but are not provided by the [facility] will be made to provide these services after discharge if needed.  Arrangements include referral to agencies that provide these services.  Your insurance has been verified to be:  [employer] Your primary doctor is:    Pertinent information will be shared with your doctor and your  insurance company.  Social Worker:  Ovidio Kin, [place] or ([phone]  Information discussed with and copy given to patient by: Elease Hashimoto, 07/26/2016, 10:28 AM

## 2016-07-26 NOTE — Progress Notes (Signed)
Subjective/Complaints: No issues overnite, remains severely aphasic  ROS- difficult to obtain due to aphasia  Objective: Vital Signs: Blood pressure (!) 100/57, pulse (!) 50, temperature 97.9 F (36.6 C), temperature source Oral, resp. rate 18, height '5\' 8"'$  (1.727 m), weight 59.8 kg (131 lb 13.4 oz), SpO2 98 %. No results found. Results for orders placed or performed during the hospital encounter of 07/25/16 (from the past 72 hour(s))  CBC WITH DIFFERENTIAL     Status: Abnormal   Collection Time: 07/26/16  6:31 AM  Result Value Ref Range   WBC 5.1 4.0 - 10.5 K/uL   RBC 3.39 (L) 3.87 - 5.11 MIL/uL   Hemoglobin 11.3 (L) 12.0 - 15.0 g/dL   HCT 34.2 (L) 36.0 - 46.0 %   MCV 100.9 (H) 78.0 - 100.0 fL   MCH 33.3 26.0 - 34.0 pg   MCHC 33.0 30.0 - 36.0 g/dL   RDW 14.5 11.5 - 15.5 %   Platelets 229 150 - 400 K/uL   Neutrophils Relative % 54 %   Neutro Abs 2.8 1.7 - 7.7 K/uL   Lymphocytes Relative 28 %   Lymphs Abs 1.4 0.7 - 4.0 K/uL   Monocytes Relative 12 %   Monocytes Absolute 0.6 0.1 - 1.0 K/uL   Eosinophils Relative 5 %   Eosinophils Absolute 0.2 0.0 - 0.7 K/uL   Basophils Relative 1 %   Basophils Absolute 0.0 0.0 - 0.1 K/uL        Assessment/Plan: 1. 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 Physiatrist is providing close team supervision and 24 hour management of active medical problems listed below. Physiatrist and rehab team continue to assess barriers to discharge/monitor patient progress toward functional and medical goals. FIM:                   Function - Comprehension Comprehension: Auditory Comprehension assist level: Follows complex conversation/direction with no assist  Function - Expression Expression: Verbal Expression assist level: Expresses basic needs/ideas: With extra time/assistive  device, Expresses basic 50 - 74% of the time/requires cueing 25 - 49% of the time. Needs to repeat parts of sentences.  Function - Social Interaction Social Interaction assist level: Interacts appropriately with others - No medications needed.  Function - Problem Solving Problem solving assist level: Solves complex problems: With extra time  Function - Memory Memory assist level: More than reasonable amount of time Patient normally able to recall (first 3 days only): That he or she is in a hospital, Location of own room, Current season  Medical Problem List and Plan: 1.  Right hemiparesis and ?aphasia vs dysarthria secondary to left frontal infarct Start PT/OT/SLP CIR evals 2.  DVT Prophylaxis/Anticoagulation: Eliquis. Venous Doppler studies negative 3. Pain Management/lupus: Ultram and Robaxin as needed. 4. Hypertension. Lisinopril 20 mg daily. Monitor with increased mobility 5. Neuropsych: This patient is capable of making decisions on her own behalf. 6. Skin/Wound Care: Routine skin checks  7. Fluids/Electrolytes/Nutrition: Routine I&O with follow-up chemistries 8. Hyperlipidemia. Lipitor 9. Tobacco/cocaine abuse. Nicoderm patch. Provide counseling 10. AKI: Follow up BMP.looks stable BMP Latest Ref Rng & Units 07/26/2016 07/24/2016 07/23/2016  Glucose 65 - 99 mg/dL 100(H) 108(H) 113(H)  BUN 6 - 20 mg/dL '18 10 11  '$ Creatinine 0.44 - 1.00 mg/dL 1.11(H) 1.01(H) 0.87  Sodium 135 - 145 mmol/L 139 138 138  Potassium 3.5 -  5.1 mmol/L 3.9 3.7 4.0  Chloride 101 - 111 mmol/L 106 101 105  CO2 22 - 32 mmol/L '27 31 26  '$ Calcium 8.9 - 10.3 mg/dL 9.0 9.0 9.3   11.  Afib with cocaine use probable causative factors  LOS (Days) 1 A FACE TO FACE EVALUATION WAS PERFORMED  Andrea Kennedy 07/26/2016, 7:30 AM
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
## 2016-07-26 NOTE — Progress Notes (Signed)
Patient information reviewed and entered into eRehab system by Inari Shin, RN, CRRN, PPS Coordinator.  Information including medical coding and functional independence measure will be reviewed and updated through discharge.     Per nursing patient was given "Data Collection Information Summary for Patients in Inpatient Rehabilitation Facilities with attached "Privacy Act Statement-Health Care Records" upon admission.  

## 2016-07-26 NOTE — Progress Notes (Signed)
Physical Therapy Session Note  Patient Details  Name: Andrea Kennedy MRN: [id number] Date of Birth: 21-Jul-1946  Today's Date: 07/26/2016 PT Individual Time: 1600-1630 PT Individual Time Calculation (min): 30 min    Short Term Goals:Week 1:  PT Short Term Goal 1 (Week 1): =LTGs due to ELOS  Skilled Therapeutic Interventions/Progress Updates:    Pt resting in bed on arrival, no c/o pain, and agreeable to therapy session.  Pt ambulates to and from therapy gym with close supervision and demos head turns, and quick changes of direction with no significant LOB.  PT administered DGI, see below for results, and explained results and interpretation to pt.  Pt verbalized understanding.  Pt returned to room at end of session and positioned in bed mod I, call bell in reach and needs met.    Therapy Documentation Precautions:  Precautions Precautions: Fall Precaution Comments: hemiparesis RUE>RLE, mild unsteadiness with gait Restrictions Weight Bearing Restrictions: No General:   Vital Signs:  Pain:   Mobility:   Locomotion :    Trunk/Postural Assessment :    Balance: Balance Balance Assessed: Yes Standardized Balance Assessment Standardized Balance Assessment: Biodex Testing Dynamic Gait Index Level Surface: Mild Impairment Change in Gait Speed: Normal Gait with Horizontal Head Turns: Mild Impairment Gait with Vertical Head Turns: Mild Impairment Gait and Pivot Turn: Mild Impairment Step Over Obstacle: Normal Step Around Obstacles: Normal Steps: Mild Impairment Total Score: 19 Exercises:   Other Treatments:     See Function Navigator for Current Functional Status.   Therapy/Group: Individual Therapy  Earnest Conroy Penven-Crew 07/26/2016, 4:27 PM

## 2016-07-26 NOTE — Evaluation (Signed)
Speech Language Pathology Assessment and Plan  Patient Details  Name: Andrea Kennedy MRN: [id number] Date of Birth: 08-06-1946  SLP Diagnosis: Aphasia;Dysphagia;Dysarthria  Rehab Potential: Good ELOS: 7-10 days     Today's Date: 07/26/2016 SLP Individual Time: 1310-1400 SLP Individual Time Calculation (min): 50 min    Problem List: Patient Active Problem List   Diagnosis Date Noted  . Frontal lobe deficit 07/25/2016  . Right hemiparesis ([place])   . Dysarthria, post-stroke   . Benign essential HTN   . Pure hypercholesterolemia   . Tobacco abuse   . AKI (acute kidney injury) ([place])   . Paroxysmal atrial fibrillation ([facility])   . Cocaine abuse   . Hyperlipidemia   . Stroke ([place]) 07/20/2016  . Stroke (cerebrum) ([place]) - L frontal embolic s/p IV tPA, d/t AF 07/20/2016  . Alteration in neurological status   . Postoperative anemia due to acute blood loss 06/05/2015  . Hip fracture, right ([place])   . Fracture of hip, right, closed ([place]) 06/03/2015  . Fall 06/03/2015  . Leukocytosis 06/03/2015  . Lupus   . Smoker   . Sciatica of right side    Past Medical History:  Past Medical History:  Diagnosis Date  . Lupus   . Sciatica of right side   . Tobacco abuse    Past Surgical History:  Past Surgical History:  Procedure Laterality Date  . TOTAL HIP ARTHROPLASTY Right 06/03/2015   Procedure: RIGHT TOTAL HIP ARTHROPLASTY ANTERIOR APPROACH;  Surgeon: Gaynelle Arabian, MD;  Location: [facility];  Service: Orthopedics;  Laterality: Right;  . TUBAL LIGATION      Assessment / Plan / Recommendation Clinical Impression Andrea Kennedy a 70 y.o.right handed femalewith history of lupus, tobacco abuse, right total hip arthroplasty 06/03/2015 and was [facility] to [facility]. Per chart review patient lives with spouse independent prior to admission. One level home with 3 steps to entry. Presented 07/20/2016 with right-sided weakness and aphasia. Urine drug screen positive for cocaine. CT/MRI of  the head showed acute moderate nonhemorrhagic left frontal lobe infarct. Patient did receive TPA. CTA of head and neck with no emergent large vessel occlusion or stenosis. EEG showed focal cerebral dysfunction over the left hemisphere suggestive of underlying structural or physiologic abnormality. No seizure activity noted. Venous Dopplers lower extremities negative for DVT. Echocardiogram with ejection fraction of 55%. Systolic function was normal. Neurology consulted maintained on Plavix and aspirin 325 mg daily for CVA prophylaxis. Subcutaneous Lovenox for DVT prophylaxis. Patient maintained on telemetry showing rapid irregular heart rate that appeared consistent with atrial fibrillation. Cardiology service is consulted advised Eliquis and aspirin/Plavix as well as Lovenox were discontinued at that time. Tolerating a mechanical soft diet. Patient with ongoing bouts Elevated blood pressure and received intravenous hydralazine. Consideration was made for TEE however it was canceled by as neurology services did not feel any further workup is needed. Physical and occupational therapy evaluations completed with recommendations of physical medicine rehabilitation consult. Patient was admitted for comprehensive rehabilitation program 07/25/16.  Cognitive-linguistic evaluation complete with patient demonstrating Moderately severe dysarthria characterized by phoneme substitutions and distortions and mild expressive aphasia characterized by occasional anomia.  Receptive language and cognition appear intact.  Bedside swallow evaluation complete with patient demonstrating buccal pocketing with solid textures; patient Mod I for use of safe swallow strategies with consumption of Dys.3 textures and thin liquids.  Patient would benefit from skilled SLP intervention in order to maximize her functional independence prior to discharge. Anticipate patient will require intermittent assist at home  and follow up outpatient SLP  services.    Skilled Therapeutic Interventions          Skilled treatment initiated with focused on addressing speech intelligibility goals. SLP facilitated session by providing Max assist multimodal cues for self-monitoring of pacing and over articulation.  Patient ~50% intelligible at the phrase level.  Continue with current plan of care.    SLP Assessment  Patient will need skilled Speech Lanaguage Pathology Services during CIR admission    Recommendations  SLP Diet Recommendations: Dysphagia 3 (Mech soft);Thin Liquid Administration via: Cup;Straw Medication Administration: Whole meds with puree Supervision: Patient able to self feed;Intermittent supervision to cue for compensatory strategies Compensations: Slow rate;Small sips/bites;Lingual sweep for clearance of pocketing Postural Changes and/or Swallow Maneuvers: Seated upright 90 degrees Oral Care Recommendations: Oral care BID Patient destination: Home Follow up Recommendations: Outpatient SLP Equipment Recommended: None recommended by SLP    SLP Frequency 3 to 5 out of 7 days   SLP Duration  SLP Intensity  SLP Treatment/Interventions 7-10 days   Minumum of 1-2 x/day, 30 to 90 minutes  Cueing hierarchy;Dysphagia/aspiration precaution training;Environmental controls;Internal/external aids;Patient/family education;Speech/Language facilitation    Pain Pain Assessment Pain Assessment: No/denies pain  Prior Functioning Cognitive/Linguistic Baseline: Within functional limits Type of Home: House  Lives With: Spouse;Family Available Help at Discharge: Family;Available 24 hours/day  Function:  Eating Eating   Modified Consistency Diet: Yes Eating Assist Level: Set up assist for;More than reasonable amount of time;Swallowing techniques: self managed   Eating Set Up Assist For: Opening containers       Cognition Comprehension Comprehension assist level: Follows complex conversation/direction with no assist  (Simultaneous filing. User may not have seen previous data.)  Expression   Expression assist level: Expresses basic 50 - 74% of the time/requires cueing 25 - 49% of the time. Needs to repeat parts of sentences. (Simultaneous filing. User may not have seen previous data.)  Social Interaction Social Interaction assist level: Interacts appropriately with others - No medications needed. (Simultaneous filing. User may not have seen previous data.)  Problem Solving Problem solving assist level: Solves complex 90% of the time/cues < 10% of the time (Simultaneous filing. User may not have seen previous data.)  Memory Memory assist level: More than reasonable amount of time (Simultaneous filing. User may not have seen previous data.)   Short Term Goals: Week 1: SLP Short Term Goal 1 (Week 1): short term goals = long term goals due to length of stay   Refer to Care Plan for Long Term Goals  Recommendations for other services: None   Discharge Criteria: Patient will be discharged from SLP if patient refuses treatment 3 consecutive times without medical reason, if treatment goals not met, if there is a change in medical status, if patient makes no progress towards goals or if patient is discharged from hospital.  The above assessment, treatment plan, treatment alternatives and goals were discussed and mutually agreed upon: by patient  Gunnar Fusi, M.A., CCC-SLP [phone]  [place] 07/26/2016, 5:06 PM

## 2016-07-26 NOTE — Evaluation (Addendum)
Physical Therapy Assessment and Plan  Patient Details  Name: Andrea Kennedy MRN: [id number] Date of Birth: 1946-02-03  PT Diagnosis: Abnormality of gait, Coordination disorder and Hemiparesis dominant Rehab Potential: Good ELOS: 7-10   Today's Date: 07/26/2016 PT Individual Time: 0909-1009 PT Individual Time Calculation (min): 60 min     Problem List:  Patient Active Problem List   Diagnosis Date Noted  . Frontal lobe deficit 07/25/2016  . Right hemiparesis ([place])   . Dysarthria, post-stroke   . Benign essential HTN   . Pure hypercholesterolemia   . Tobacco abuse   . AKI (acute kidney injury) ([place])   . Paroxysmal atrial fibrillation ([facility])   . Cocaine abuse   . Hyperlipidemia   . Stroke ([place]) 07/20/2016  . Stroke (cerebrum) ([place]) - L frontal embolic s/p IV tPA, d/t AF 07/20/2016  . Alteration in neurological status   . Postoperative anemia due to acute blood loss 06/05/2015  . Hip fracture, right ([place])   . Fracture of hip, right, closed ([place]) 06/03/2015  . Fall 06/03/2015  . Leukocytosis 06/03/2015  . Lupus   . Smoker   . Sciatica of right side     Past Medical History:  Past Medical History:  Diagnosis Date  . Lupus   . Sciatica of right side   . Tobacco abuse    Past Surgical History:  Past Surgical History:  Procedure Laterality Date  . TOTAL HIP ARTHROPLASTY Right 06/03/2015   Procedure: RIGHT TOTAL HIP ARTHROPLASTY ANTERIOR APPROACH;  Surgeon: Gaynelle Arabian, MD;  Location: [facility];  Service: Orthopedics;  Laterality: Right;  . TUBAL LIGATION      Assessment & Plan Clinical Impression: Pt is a 70 y.o. right handed female with history of lupus, tobacco abuse, right total hip arthroplasty 06/03/2015 and was discharged to [facility]. Per chart review patient lives with spouse independent prior to admission. One level home with 3 steps to entry. Presented 07/20/2016 with right-sided weakness and aphasia.Urine drug screen positive for cocaine. CT/MRI of the head  showed acute moderate nonhemorrhagic left frontal lobe infarct. Patient did receive TPA.. CTA of head and neck with no emergent large vessel occlusion or stenosis. EEG showed focal cerebral dysfunction over the left hemisphere suggestive of underlying structural or physiologic abnormality. No seizure activity noted. Venous Dopplers lower extremities negative for DVT.Echocardiogram with ejection fraction of 55%. Systolic function was normal. Neurology consulted maintained on Plavix and aspirin 325 mg daily for CVA prophylaxis. Subcutaneous Lovenox for DVT prophylaxis. Patient maintained on telemetry showing rapid irregular heart rate that appeared consistent with atrial fibrillation. Cardiology service is consulted advised Eliquis and aspirin/Plavix as well as Lovenox were discontinued at that time. Tolerating a mechanical soft diet. Patient with ongoing bouts Elevated blood pressure and received intravenous hydralazine.Consideration was made for TEE however it was canceled by neurology services did not feel any further workup is needed. Physical and occupational therapy evaluations completed with recommendations of physical medicine rehabilitation consult. Patient to be admitted for comprehensive inpatient rehabilitation program.    Patient transferred to [facility] on 07/25/2016 .   Patient currently requires min with mobility secondary to muscle weakness, unbalanced muscle activation and decreased coordination, decreased attention to right and decreased standing balance, decreased postural control and decreased balance strategies.  Prior to hospitalization, patient was independent  with mobility and lived with Spouse, Family in a House home.  Home access is 3Stairs to enter.  Patient will benefit from skilled PT intervention to maximize safe functional mobility, minimize fall risk  and decrease caregiver burden for planned discharge home with 24 hour supervision.  Anticipate patient will benefit from follow up OP at  discharge.  PT - End of Session Activity Tolerance: Tolerates 30+ min activity without fatigue Endurance Deficit: No PT Assessment Rehab Potential (ACUTE/IP ONLY): Good Barriers to Discharge: Decreased caregiver support PT Patient demonstrates impairments in the following area(s): Balance;Motor;Perception;Safety PT Transfers Functional Problem(s): Bed Mobility;Bed to Chair;Car;Furniture;Floor PT Locomotion Functional Problem(s): Stairs;Ambulation PT Plan PT Intensity: Minimum of 1-2 x/day ,45 to 90 minutes PT Frequency: 5 out of 7 days PT Duration Estimated Length of Stay: 7-10 PT Treatment/Interventions: Ambulation/gait training;Community reintegration;DME/adaptive equipment instruction;Neuromuscular re-education;Psychosocial support;Stair training;UE/LE Strength taining/ROM;UE/LE Coordination activities;Therapeutic Activities;Discharge planning;Balance/vestibular training;Functional mobility training;Patient/family education;Splinting/orthotics;Therapeutic Exercise;Visual/perceptual remediation/compensation PT Transfers Anticipated Outcome(s): mod I PT Locomotion Anticipated Outcome(s): mod I ambulatory household, supervision in community PT Recommendation Recommendations for Other Services: Neuropsych consult Follow Up Recommendations: Outpatient PT;24 hour supervision/assistance Patient destination: Home Equipment Recommended: To be determined  Skilled Therapeutic Intervention Pt resting in bed on arrival, no c/o pain but states that sometimes the bright sunshine gives her a HA.  Pt agreeable to therapy session.  PT initiated pt education for role of PT, plan of care, goals, ELOS, and safety plan.  PT instructed pt in transfers with overall supervision/min guard and min verbal cues for hand placement.  Ambulation throughout unit, max distance 300', with no device and min guard>min assist.  Pt somewhat impulsive with ambulation demo'd by quick changes in direction with minor LOB which pt  corrects with min assist.  Stair negotiation x12 steps with L handrail and min guard assist for alternating pattern.  nustep x10 minutes with 4 extremities focus on use of RUE to drive movement for forced use and reciprocal pattern.  Pt returned to room at end of session with min cues for path finding and positioned back in bed with call bell in reach and needs met.   PT Evaluation Precautions/Restrictions Precautions Precautions: Fall Precaution Comments: hemiparesis RUE>RLE, mild unsteadiness with gait Restrictions Weight Bearing Restrictions: No Home Living/Prior Functioning Home Living Living Arrangements: Spouse/significant other;Other relatives Available Help at Discharge: Family;Available 24 hours/day Type of Home: House Home Access: Stairs to enter [employer] of Steps: 3 Entrance Stairs-Rails: Right;Left;Can reach both Home Layout: One level Bathroom Shower/Tub: Tub/shower unit (has grab bars and hand held shower.  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 Vision/Perception  Vision - Assessment Eye Alignment: Within Functional Limits Ocular Range of Motion: Within Functional Limits Alignment/Gaze Preference: Within Defined Limits Tracking/Visual Pursuits: Able to track stimulus in all quads without difficulty Saccades: Decreased speed of saccadic movement (impaired scanning back to Rt) Perception Comments: mild inattention to RUE when not in use during functional tasks  Cognition Overall Cognitive Status: Impaired/Different from baseline Arousal/Alertness:  Awake/alert Orientation Level: Oriented X4 Attention: Alternating Sustained Attention: Appears intact Selective Attention: Appears intact Alternating Attention: Appears intact Awareness: Appears intact Safety/Judgment: Appears intact Sensation Sensation Light Touch: Appears Intact Stereognosis: Impaired by gross assessment (reports tingling and numbness) Coordination Gross Motor Movements are Fluid and Coordinated: No Fine Motor Movements are Fluid and Coordinated: No Finger Nose Finger Test: WNL on L, impaired on  R but able to bring hand to nose and to therapist's hand, overshooting Heel Shin Test: WNL on L, decreased ROM on R but coordination intact Motor  Motor Motor: Other (comment) Motor - Skilled Clinical Observations: R hemiparesis, UE>LE  Mobility Bed Mobility Bed Mobility: Supine to Sit Supine to Sit: 5: Supervision;HOB elevated Transfers Transfers: Yes Sit to Stand: 5: Supervision Stand to Sit: 5: Supervision Locomotion  Ambulation Ambulation: Yes Ambulation/Gait Assistance: 4: Min guard Ambulation Distance (Feet): 300 Feet Assistive device: None Gait Gait: Yes Gait Pattern: Impaired Gait Pattern: Narrow base of support;Decreased weight shift to right;Decreased stance time - right High Level Ambulation High Level Ambulation: Direction changes Direction Changes: min guard Stairs / Additional Locomotion Stairs: Yes Stairs Assistance: 4: Min guard Stair Management Technique: One rail Left;Alternating pattern;Forwards Number of Stairs: 12 Ramp:  (min guard) Curb:  (min guard) Wheelchair Mobility Wheelchair Mobility: No  Trunk/Postural Assessment  Cervical Assessment Cervical Assessment: Within Functional Limits Thoracic Assessment Thoracic Assessment: Within Functional Limits Lumbar Assessment Lumbar Assessment: Within Functional Limits Postural Control Postural Control: Within Functional Limits  Balance Balance Balance Assessed: Yes Dynamic  Sitting Balance Dynamic Sitting - Balance Support: No upper extremity supported;Feet supported;During functional activity Dynamic Sitting - Level of Assistance: 5: Stand by assistance Sitting balance - Comments: dressing sitting EOB Static Standing Balance Static Standing - Balance Support: No upper extremity supported;During functional activity Static Standing - Level of Assistance: 5: Stand by assistance Dynamic Standing Balance Dynamic Standing - Balance Support: No upper extremity supported;During functional activity Dynamic Standing - Level of Assistance: 4: Min assist Extremity Assessment  RUE Assessment RUE Assessment: Exceptions to [facility] RUE AROM (degrees) Overall AROM Right Upper Extremity: Within functional limits for tasks performed RUE Overall AROM Comments: Pt demonstrated ROM WFL, however with decreased ability to incorporate into functional tasks RUE Strength RUE Overall Strength: Deficits RUE Overall Strength Comments: strength grossly 4/5 at shoulder and elbow, 3-/5 wrist, no finger flexion/extension or gross grasp LUE Assessment LUE Assessment: Within Functional Limits RLE Assessment RLE Assessment: Within Functional Limits (5/5 throughout) LLE Assessment LLE Assessment: Within Functional Limits (5/5 throughout)   See Function Navigator for Current Functional Status.   Refer to Care Plan for Long Term Goals  Recommendations for other services: Neuropsych  Discharge Criteria: Patient will be discharged from PT if patient refuses treatment 3 consecutive times without medical reason, if treatment goals not met, if there is a change in medical status, if patient makes no progress towards goals or if patient is discharged from hospital.  The above assessment, treatment plan, treatment alternatives and goals were discussed and mutually agreed upon: by patient  Earnest Conroy Penven-Crew 07/26/2016, 12:59 PM

## 2016-07-27 ENCOUNTER — Inpatient Hospital Stay (HOSPITAL_COMMUNITY): Payer: Medicare HMO | Admitting: Physical Therapy

## 2016-07-27 ENCOUNTER — Inpatient Hospital Stay (HOSPITAL_COMMUNITY): Payer: Medicare HMO | Admitting: Occupational Therapy

## 2016-07-27 ENCOUNTER — Inpatient Hospital Stay (HOSPITAL_COMMUNITY): Payer: Medicare HMO | Admitting: Speech Pathology

## 2016-07-27 DIAGNOSIS — G8191 Hemiplegia, unspecified affecting right dominant side: Secondary | ICD-10-CM

## 2016-07-27 NOTE — Progress Notes (Signed)
Physical Therapy Session Note  Patient Details  Name: Andrea Kennedy MRN: [id number] Date of Birth: 01/28/1946  Today's Date: 07/27/2016 PT Individual Time: 0902-0959 PT Individual Time Calculation (min): 57 min    Short Term Goals: Week 1:  PT Short Term Goal 1 (Week 1): =LTGs due to ELOS  Skilled Therapeutic Interventions/Progress Updates:    Pt resting in bed on arrival, no c/o pain and agreeable to therapy session.  Session focus on high level gait, balance, and NMR.  Pt requesting to toilet prior to session and does so with mod I.    Gait to/from therapy gym with distant supervision.  Nustep x8 minutes with BUEs/BLEs for reciprocal stepping pattern, forced use, and activity tolerance.  PT instructed pt in obstacle course with compliant surfaces, stepping over/around obstacles, and negotiating a 10" step with no device.  Pt completed obstacle course 4 trials with steady assist fade to distant supervision with practice.  PT instructed pt in activities on rocker board in // bars without UE support focus on ankle strategy for static stance, heel raises, ball toss, BUE shoulder flexion (AAROM for RUE), and trunk rotation with shoulders flexed to 90 (LUE supporting RUE).  Pt noted to have large hematoma on L forearm (states from IV in ambulance).  PT applied kinesiotape for fluid dynamics to reduce hematoma.  Provided pt with education on purpose of taping, contraindications for heat, and s/s of tape intolerance.  Pt verbalized understanding of all.  Pt ambulated back to room in same manner as above at end of session and positioned in bed mod I.  Call bell in reach and needs met.   Therapy Documentation Precautions:  Precautions Precautions: Fall Precaution Comments: hemiparesis RUE>RLE, mild unsteadiness with gait Restrictions Weight Bearing Restrictions: No   See Function Navigator for Current Functional Status.   Therapy/Group: Individual Therapy  Jimmie Rueter E Penven-Crew 07/27/2016,  10:01 AM

## 2016-07-27 NOTE — Progress Notes (Signed)
Speech Language Pathology Daily Session Note  Patient Details  Name: Andrea Kennedy MRN: [id number] Date of Birth: 05-17-1946  Today's Date: 07/27/2016 SLP Individual Time: 1300-1330 SLP Individual Time Calculation (min): 30 min   Short Term Goals: Week 1: SLP Short Term Goal 1 (Week 1): short term goals = long term goals due to length of stay   Skilled Therapeutic Interventions: Skilled treatment session focused on speech goals. SLP facilitated session by providing Mod A verbal and question cues for patient to self-monitor and correct errors and for utilization of speech intelligibility strategies at the sentence level to achieve ~50-75% intelligibility. Patient independently requested to use the bathroom and completed task with supervision. Patient left upright in bed with all needs within reach. Continue with current plan of care.   Function:    Cognition Comprehension Comprehension assist level: Follows complex conversation/direction with no assist  Expression   Expression assist level: Expresses basic 50 - 74% of the time/requires cueing 25 - 49% of the time. Needs to repeat parts of sentences.  Social Interaction Social Interaction assist level: Interacts appropriately with others - No medications needed.  Problem Solving Problem solving assist level: Solves complex 90% of the time/cues < 10% of the time  Memory Memory assist level: More than reasonable amount of time    Pain No/Denies Pain   Therapy/Group: Individual Therapy  Andrea Kennedy 07/27/2016, 2:31 PM

## 2016-07-27 NOTE — Progress Notes (Signed)
Occupational Therapy Session Note  Patient Details  Name: Andrea Kennedy MRN: [id number] Date of Birth: [birth year]  Today's Date: 07/27/2016 OT Individual Time: 1015-1100 and 1432-1500 OT Individual Time Calculation (min): 45 min and 28 min    Short Term Goals: Week 1:  OT Short Term Goal 1 (Week 1): STG = LTGs due to short ELOS  Skilled Therapeutic Interventions/Progress Updates:    1) Treatment session with focus on functional mobility, self-care tasks, and functional use of dominant RUE.  Pt received resting in bed, willing to engage in treatment session.  Pt ambulated to toilet without assistance and completed toileting with supervision.  Ambulated around room without AD while folding and placing clothing into dresser drawers.  Min cues to incorporate RUE into folding and carrying, with minimal integration.  Changed clothes with setup assist and supervision, except therapist assisting with tying shoes.  Issued pt elastic shoe laces with pt able to don shoes without assist.  Engaged in [place] in sitting with focus on forced use with attempting to grasp cups.  Required hand over hand to open hand and facilitate thumb abduction for grasp.   2) Treatment session with focus on RUE NMR.  Provided pt with self-ROM handout with focus on supination/pronation, wrist flexion/extension, ulnar/radial deviation, and thumb opposition/abduction.  Engaged in 10 reps of each exercise with min cues for proper technique.  Encouraged pt to continue to attempt to integrate RUE into functional tasks and to complete 10 more reps of each exercise this evening.  Therapy Documentation Precautions:  Precautions Precautions: Fall Precaution Comments: hemiparesis RUE>RLE, mild unsteadiness with gait Restrictions Weight Bearing Restrictions: No General:   Vital Signs: Therapy Vitals BP: 122/80 Pain:  Pt with no c/o pain  See Function Navigator for Current Functional Status.   Therapy/Group: Individual  Therapy  Simonne Come 07/27/2016, 12:04 PM

## 2016-07-27 NOTE — Progress Notes (Signed)
Physical Therapy Session Note  Patient Details  Name: Andrea Kennedy MRN: [id number] Date of Birth: [birth year]  Today's Date: 07/27/2016 PT Individual Time: 1545-1630 PT Individual Time Calculation (min): 45 min    Short Term Goals: Week 1:  PT Short Term Goal 1 (Week 1): =LTGs due to ELOS  Skilled Therapeutic Interventions/Progress Updates:    Pt resting in bed on arrival with no c/o pain and agreeable to therapy session.  Session focus on ambulation for improved gait pattern and balance, NMR in maxisky for stepping strategy for balance recovery as well as dynamic activity during ambulation.  Pt ambulates throughout unit, max distance 400', with no device and supervision with min verbal cues for increasing gait speed to preferred speed PTA.  NMR in [facility] for safety focus on stepping strategy to recover balance from outside perturbations in static standing and during ambulation. [place] also utilized for safety for pt to ambulate while boxing focus on balance during dynamic task and functional use and strengthening of RUE.  Pt returned to room at end of session and positioned in bed with call bell in reach and needs met.   Therapy Documentation Precautions:  Precautions Precautions: Fall Precaution Comments: hemiparesis RUE>RLE, mild unsteadiness with gait Restrictions Weight Bearing Restrictions: No   See Function Navigator for Current Functional Status.   Therapy/Group: Individual Therapy  Earnest Conroy Penven-Crew 07/27/2016, 4:50 PM

## 2016-07-27 NOTE — Progress Notes (Signed)
Subjective/Complaints: No new problems except Right hand fisting closed  ROS- difficult to obtain due to aphasia  Objective: Vital Signs: Blood pressure 121/67, pulse (!) 58, temperature 97.6 F (36.4 C), temperature source Oral, resp. rate 16, height '5\' 8"'  (1.727 m), weight 61.4 kg (135 lb 5.8 oz), SpO2 98 %. No results found. Results for orders placed or performed during the hospital encounter of 07/25/16 (from the past 72 hour(s))  CBC WITH DIFFERENTIAL     Status: Abnormal   Collection Time: 07/26/16  6:31 AM  Result Value Ref Range   WBC 5.1 4.0 - 10.5 K/uL   RBC 3.39 (L) 3.87 - 5.11 MIL/uL   Hemoglobin 11.3 (L) 12.0 - 15.0 g/dL   HCT 34.2 (L) 36.0 - 46.0 %   MCV 100.9 (H) 78.0 - 100.0 fL   MCH 33.3 26.0 - 34.0 pg   MCHC 33.0 30.0 - 36.0 g/dL   RDW 14.5 11.5 - 15.5 %   Platelets 229 150 - 400 K/uL   Neutrophils Relative % 54 %   Neutro Abs 2.8 1.7 - 7.7 K/uL   Lymphocytes Relative 28 %   Lymphs Abs 1.4 0.7 - 4.0 K/uL   Monocytes Relative 12 %   Monocytes Absolute 0.6 0.1 - 1.0 K/uL   Eosinophils Relative 5 %   Eosinophils Absolute 0.2 0.0 - 0.7 K/uL   Basophils Relative 1 %   Basophils Absolute 0.0 0.0 - 0.1 K/uL  Comprehensive metabolic panel     Status: Abnormal   Collection Time: 07/26/16  6:31 AM  Result Value Ref Range   Sodium 139 135 - 145 mmol/L   Potassium 3.9 3.5 - 5.1 mmol/L   Chloride 106 101 - 111 mmol/L   CO2 27 22 - 32 mmol/L   Glucose, Bld 100 (H) 65 - 99 mg/dL   BUN 18 6 - 20 mg/dL   Creatinine, Ser 1.11 (H) 0.44 - 1.00 mg/dL   Calcium 9.0 8.9 - 10.3 mg/dL   Total Protein 5.6 (L) 6.5 - 8.1 g/dL   Albumin 3.4 (L) 3.5 - 5.0 g/dL   AST 22 15 - 41 U/L   ALT 15 14 - 54 U/L   Alkaline Phosphatase 38 38 - 126 U/L   Total Bilirubin 0.6 0.3 - 1.2 mg/dL   GFR calc non Af Amer 50 (L) >60 mL/min   GFR calc Af Amer 58 (L) >60 mL/min    Comment: (NOTE) The eGFR has been calculated using the CKD EPI equation. This calculation has not been validated in  all clinical situations. eGFR's persistently <60 mL/min signify possible Chronic Kidney Disease.    Anion gap 6 5 - 15        Assessment/Plan: 1. 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 Physiatrist is providing close team supervision and 24 hour management of active medical problems listed below. Physiatrist and rehab team continue to assess barriers to discharge/monitor patient progress toward functional and medical goals. FIM: Function - Bathing Position: Shower Body parts bathed by patient: Right arm, Chest, Abdomen, Front perineal area, Buttocks, Right upper leg, Left upper leg Bathing not applicable: Left arm, Right lower leg, Left lower leg, Back Assist Level: Supervision or verbal cues (Mod assist, supervision for standing balance)  Function- Upper Body Dressing/Undressing What is the patient wearing?: Bra, Pull over shirt/dress Bra - Perfomed by patient: Thread/unthread right bra  strap, Thread/unthread left bra strap Bra - Perfomed by helper: Hook/unhook bra (pull down sports bra) Pull over shirt/dress - Perfomed by patient: Thread/unthread right sleeve, Thread/unthread left sleeve, Put head through opening, Pull shirt over trunk Assist Level: Touching or steadying assistance(Pt > 75%) Function - Lower Body Dressing/Undressing What is the patient wearing?: Underwear, Pants, Non-skid slipper socks Position: Sitting EOB Underwear - Performed by patient: Thread/unthread right underwear leg, Thread/unthread left underwear leg, Pull underwear up/down Pants- Performed by patient: Thread/unthread right pants leg, Thread/unthread left pants leg, Pull pants up/down Non-skid slipper socks- Performed by patient: Don/doff right sock, Don/doff left sock Assist for footwear: Setup Assist for lower body dressing: Supervision or  verbal cues  Function - Toileting Toileting steps completed by patient: Adjust clothing prior to toileting, Performs perineal hygiene, Adjust clothing after toileting Toileting Assistive Devices: Grab bar or rail Assist level: Supervision or verbal cues  Function Midwife transfer assistive device: Grab bar Assist level to toilet: Supervision or verbal cues Assist level from toilet: Supervision or verbal cues  Function - Chair/bed transfer Chair/bed transfer method: Ambulatory Chair/bed transfer assist level: Touching or steadying assistance (Pt > 75%) Chair/bed transfer assistive device: Armrests Chair/bed transfer details: Verbal cues for precautions/safety  Function - Locomotion: Wheelchair Will patient use wheelchair at discharge?: No Wheelchair activity did not occur: N/A (pt ambulatory on unit) Function - Locomotion: Ambulation Assistive device: No device Max distance: 300 Assist level: Touching or steadying assistance (Pt > 75%) Assist level: Touching or steadying assistance (Pt > 75%) Assist level: Touching or steadying assistance (Pt > 75%) Assist level: Touching or steadying assistance (Pt > 75%) Assist level: Touching or steadying assistance (Pt > 75%)  Function - Comprehension Comprehension: Auditory (Simultaneous filing. User may not have seen previous data.) Comprehension assist level: Follows complex conversation/direction with no assist (Simultaneous filing. User may not have seen previous data.)  Function - Expression Expression: Verbal (Simultaneous filing. User may not have seen previous data.) Expression assist level: Expresses basic 50 - 74% of the time/requires cueing 25 - 49% of the time. Needs to repeat parts of sentences. (Simultaneous filing. User may not have seen previous data.)  Function - Social Interaction Social Interaction assist level: Interacts appropriately with others - No medications needed. (Simultaneous filing. User may  not have seen previous data.)  Function - Problem Solving Problem solving assist level: Solves complex 90% of the time/cues < 10% of the time (Simultaneous filing. User may not have seen previous data.)  Function - Memory Memory assist level: More than reasonable amount of time (Simultaneous filing. User may not have seen previous data.) Patient normally able to recall (first 3 days only): That he or she is in a hospital, Location of own room, Current season (Simultaneous filing. User may not have seen previous data.)  Medical Problem List and Plan: 1.  Right hemiparesis and ?aphasia vs dysarthria secondary to left frontal infarct Team conference today please see physician documentation under team conference tab, met with team face-to-face to discuss problems,progress, and goals. Formulized individual treatment plan based on medical history, underlying problem and comorbidities. 2.  DVT Prophylaxis/Anticoagulation: Eliquis. Venous Doppler studies negative 3. Pain Management/lupus: Ultram and Robaxin as needed. 4. Hypertension. Lisinopril 20 mg daily. Monitor with increased mobility 5. Neuropsych: This patient is capable of making decisions on her own behalf. 6. Skin/Wound Care: Routine skin checks  7. Fluids/Electrolytes/Nutrition: Routine I&O with follow-up chemistries 8. Hyperlipidemia. Lipitor 9. Tobacco/cocaine abuse. Nicoderm patch. Provide counseling 10. AKI: Follow  up BMP.looks stable BMP Latest Ref Rng & Units 07/26/2016 07/24/2016 07/23/2016  Glucose 65 - 99 mg/dL 100(H) 108(H) 113(H)  BUN 6 - 20 mg/dL '18 10 11  ' Creatinine 0.44 - 1.00 mg/dL 1.11(H) 1.01(H) 0.87  Sodium 135 - 145 mmol/L 139 138 138  Potassium 3.5 - 5.1 mmol/L 3.9 3.7 4.0  Chloride 101 - 111 mmol/L 106 101 105  CO2 22 - 32 mmol/L '27 31 26  ' Calcium 8.9 - 10.3 mg/dL 9.0 9.0 9.3   11.  Afib with cocaine use probable causative factors  12.  Anemia will check stool guaic LOS (Days) 2 A FACE TO FACE EVALUATION WAS  PERFORMED  KIRSTEINS,ANDREW E 07/27/2016, 8:17 AM

## 2016-07-27 NOTE — Progress Notes (Addendum)
Social Work Patient ID: Andrea Kennedy, female   DOB: 1945/09/06, 70 y.o.   MRN: [id number]  Met with pt, husband and mother in-law who were here visiting to inform team conference goals-mod/i level and discharge target 12/20. Pt was pleased and realizes she needs to work on her hand and arm although it is not coordinated. Husband said he will be there with her. Mother in-law tried to direct the conversation to her own health  Issues but this worker would not allow it and remained talking about pt and her needs at discharge. Alos spoke with pt's daughter regarding team conference results and she reports she can come and stay with her as long as she is mod/i due to she works during the day.

## 2016-07-28 ENCOUNTER — Inpatient Hospital Stay (HOSPITAL_COMMUNITY): Payer: Medicare HMO | Admitting: Occupational Therapy

## 2016-07-28 ENCOUNTER — Inpatient Hospital Stay (HOSPITAL_COMMUNITY): Payer: Medicare HMO | Admitting: Physical Therapy

## 2016-07-28 ENCOUNTER — Encounter (HOSPITAL_COMMUNITY): Payer: Medicare HMO

## 2016-07-28 ENCOUNTER — Ambulatory Visit (HOSPITAL_COMMUNITY): Payer: Medicare HMO | Admitting: Physical Therapy

## 2016-07-28 ENCOUNTER — Inpatient Hospital Stay (HOSPITAL_COMMUNITY): Payer: Medicare HMO

## 2016-07-28 ENCOUNTER — Inpatient Hospital Stay (HOSPITAL_COMMUNITY): Payer: Medicare HMO | Admitting: Speech Pathology

## 2016-07-28 DIAGNOSIS — R269 Unspecified abnormalities of gait and mobility: Secondary | ICD-10-CM

## 2016-07-28 DIAGNOSIS — I69320 Aphasia following cerebral infarction: Secondary | ICD-10-CM

## 2016-07-28 DIAGNOSIS — I69398 Other sequelae of cerebral infarction: Secondary | ICD-10-CM

## 2016-07-28 MED ORDER — SALINE SPRAY 0.65 % NA SOLN
1.0000 | NASAL | Status: DC | PRN
  Filled 2016-07-28: qty 44

## 2016-07-28 NOTE — Progress Notes (Signed)
Speech Language Pathology Daily Session Note  Patient Details  Name: Andrea Kennedy MRN: [id number] Date of Birth: 04-06-1946  Today's Date: 07/28/2016 SLP Individual Time: 5625-6389 SLP Individual Time Calculation (min): 26 min   Short Term Goals: Week 1: SLP Short Term Goal 1 (Week 1): short term goals = long term goals due to length of stay   Skilled Therapeutic Interventions:  Pt was seen for skilled ST targeting communication goals.  Pt was able to verbalize speech intelligibility strategies with supervision question cues but needed min-mod assist verbal cues for use of overarticulation and slow rate to achieve intelligibility at the phrase level during functional conversations with SLP.  Pt was left in bed with call bell within reach.  Continue per current plan of care.       Function:  Eating Eating                 Cognition Comprehension Comprehension assist level: Follows basic conversation/direction with extra time/assistive device  Expression   Expression assist level: Expresses basic 50 - 74% of the time/requires cueing 25 - 49% of the time. Needs to repeat parts of sentences.  Social Interaction Social Interaction assist level: Interacts appropriately 90% of the time - Needs monitoring or encouragement for participation or interaction.  Problem Solving Problem solving assist level: Solves basic problems with no assist  Memory Memory assist level: Recognizes or recalls 90% of the time/requires cueing < 10% of the time    Pain Pain Assessment Pain Assessment: No/denies pain  Therapy/Group: Individual Therapy  Beyonca Wisz, Selinda Orion 07/28/2016, 8:17 AM

## 2016-07-28 NOTE — Patient Care Conference (Signed)
Inpatient RehabilitationTeam Conference and Plan of Care Update Date: 07/27/2016   Time: 11:15 AM    Patient Name: Andrea Kennedy      Medical Record Number: [id number]  Date of Birth: [birth year] Sex: Female         Room/Bed: 4M07C/4M07C-01 Payor Info: Payor: [employer] MEDICARE / Plan: [place] HMO / Product Type: *No Product type* /    Admitting Diagnosis: L CVA  Admit Date/Time:  07/25/2016  3:12 PM Admission Comments: No comment available   Primary Diagnosis:  <principal problem not specified> Principal Problem: <principal problem not specified>  Patient Active Problem List   Diagnosis Date Noted  . Frontal lobe deficit 07/25/2016  . Right hemiparesis ([place])   . Dysarthria, post-stroke   . Benign essential HTN   . Pure hypercholesterolemia   . Tobacco abuse   . AKI (acute kidney injury) ([place])   . Paroxysmal atrial fibrillation ([facility])   . Cocaine abuse   . Hyperlipidemia   . Stroke ([place]) 07/20/2016  . Stroke (cerebrum) ([place]) - L frontal embolic s/p IV tPA, d/t AF 07/20/2016  . Alteration in neurological status   . Postoperative anemia due to acute blood loss 06/05/2015  . Hip fracture, right ([place])   . Fracture of hip, right, closed ([place]) 06/03/2015  . Fall 06/03/2015  . Leukocytosis 06/03/2015  . Lupus   . Smoker   . Sciatica of right side     Expected Discharge Date: Expected Discharge Date: 08/03/16  Team Members Present: Physician leading conference: Dr. Alysia Penna Social Worker Present: Ovidio Kin, LCSW Nurse Present: Dorien Chihuahua, RN PT Present: Dwyane Dee, PT OT Present: Simonne Come, OT SLP Present: Windell Moulding, SLP PPS Coordinator present : Daiva Nakayama, RN, CRRN     Current Status/Progress Goal Weekly Team Focus  Medical   Patient with a aphasia and dysarthria. Patient without significant pains. Mobility is good compared to communication and right upper extremity function  Maintain medical stability during inpatient rehabilitation.   Improve safety with functional mobility.   Bowel/Bladder   Continent of bowel & bladder, LBM 07/25/16  continue continence  continue to monitor   Swallow/Nutrition/ Hydration   Dys 3, thin liquids   mod I   trials of advanced consistencies and use of swallowing precautions    ADL's   min assist bathing and UB dressing, supervision transfers in room  Mod I overall  dynamic standing balance, RUE NMR   Mobility   supervision/min assist overall   mod I ambulatory, supervision in community  NMR, balance, activity tolerance   Communication   moderately severe dysarthria   mod I   education and carryover of compensatory intelligibility strategies    Safety/Cognition/ Behavioral Observations  WFL for tasks assessed         Pain   c/o pain last night to the right shoulder & a headache pain scale 7. Has rhe headache occasionally, c/o soreness to the shoulder, has tylenol 650 & tramadol 50 ordered prn, averages once per day  pain scale less than 4  continue to assess & treat prn   Skin   slight masd area to groin, ecchymosis to abdomen  no new areas of skin breakdown  continue to assess q shift      *See Care Plan and progress notes for long and short-term goals.  Barriers to Discharge: Has elderly mother-in-law at home, family conflict    Possible Resolutions to Barriers:  Continue rehabilitation, optimize functional mobility and self-care skills  Discharge Planning/Teaching Needs:  Pt deciding if going home with husband or her daughter, her mother in-law who stays with them is very mean to pt.       Team Discussion:  Pt doing well and making progress-goals mod/i level. Working on standing balance, hand and speech. Neruo-psych to see tomorrow for coping. Pt deciding if going home versus to daughter's due to stress at home. MD addressing headaches  Revisions to Treatment Plan:  DC 12/20   Continued Need for Acute Rehabilitation Level of Care: The patient requires daily medical  management by a physician with specialized training in physical medicine and rehabilitation for the following conditions: Daily direction of a multidisciplinary physical rehabilitation program to ensure safe treatment while eliciting the highest outcome that is of practical value to the patient.: Yes Daily medical management of patient stability for increased activity during participation in an intensive rehabilitation regime.: Yes Daily analysis of laboratory values and/or radiology reports with any subsequent need for medication adjustment of medical intervention for : Neurological problems  Elease Hashimoto 07/28/2016, 8:49 AM

## 2016-07-28 NOTE — IPOC Note (Signed)
Overall Plan of Care [facility]) Patient Details Name: Andrea Kennedy MRN: [id number] DOB: 03/02/1946  Admitting Diagnosis: L CVA  Hospital Problems: Active Problems:   Frontal lobe deficit     Functional Problem List: Nursing Endurance, Medication Management, Pain, Safety, Skin Integrity  PT Balance, Motor, Perception, Safety  OT Balance, Endurance, Motor, Perception, Safety  SLP Linguistic, Nutrition  TR         Basic ADL's: OT Grooming, Bathing, Dressing, Toileting, Eating     Advanced  ADL's: OT Simple Meal Preparation, Laundry     Transfers: PT Bed Mobility, Bed to Chair, Car, [employer], Futures trader, Tub/Shower     Locomotion: PT Stairs, Ambulation     Additional Impairments: OT Fuctional Use of Upper Extremity  SLP Swallowing, Communication expression    TR      Anticipated Outcomes Item Anticipated Outcome  Self Feeding Supervision/setup  Swallowing  Mod I    Basic self-care  Mod I  Toileting  Mod I   Bathroom Transfers Mod I  Bowel/Bladder  continent of bowel and bladder with no assist  Transfers  mod I  Locomotion  mod I ambulatory household, supervision in community  Communication  Mod I at the phrase level   Cognition     Pain  pain less than or equal to 4/10  Safety/Judgment  free from skin injury/breakdown and displaying sound safety judgement with min assist   Therapy Plan: PT Intensity: Minimum of 1-2 x/day ,45 to 90 minutes PT Frequency: 5 out of 7 days PT Duration Estimated Length of Stay: 7-10 OT Intensity: Minimum of 1-2 x/day, 45 to 90 minutes OT Frequency: 5 out of 7 days OT Duration/Estimated Length of Stay: 7-10 days SLP Intensity: Minumum of 1-2 x/day, 30 to 90 minutes SLP Frequency: 3 to 5 out of 7 days SLP Duration/Estimated Length of Stay: 7-10 days        Team Interventions: Nursing Interventions Patient/Family Education, Disease Management/Prevention, Pain Management, Medication Management, Skin Care/Wound  Management, Discharge Planning  PT interventions Ambulation/gait training, Community reintegration, DME/adaptive equipment instruction, Neuromuscular re-education, Psychosocial support, Stair training, UE/LE Strength taining/ROM, UE/LE Coordination activities, Therapeutic Activities, Discharge planning, Training and development officer, Functional mobility training, Patient/family education, Splinting/orthotics, Therapeutic Exercise, Visual/perceptual remediation/compensation  OT Interventions Training and development officer, Community reintegration, Discharge planning, Disease mangement/prevention, DME/adaptive equipment instruction, Functional electrical stimulation, Functional mobility training, Neuromuscular re-education, Pain management, Patient/family education, Psychosocial support, Self Care/advanced ADL retraining, Skin care/wound managment, Therapeutic Activities, UE/LE Strength taining/ROM, Therapeutic Exercise, UE/LE Coordination activities  SLP Interventions Cueing hierarchy, Dysphagia/aspiration precaution training, Environmental controls, Internal/external aids, Patient/family education, Speech/Language facilitation  TR Interventions    SW/CM Interventions Discharge Planning, Psychosocial Support, Patient/Family Education    Team Discharge Planning: Destination: PT-Home ,OT- Home , SLP-Home Projected Follow-up: PT-Outpatient PT, 24 hour supervision/assistance, OT-  Outpatient OT, SLP-Outpatient SLP Projected Equipment Needs: PT-To be determined, OT- To be determined, SLP-None recommended by SLP Equipment Details: PT- , OT-  Patient/family involved in discharge planning: PT- Patient,  OT-Patient, SLP-Patient  MD ELOS: 15-19d Medical Rehab Prognosis:  Excellent Assessment: HPI: Andrea Kennedy a 70 y.o.right handed femalewith history of lupus, tobacco abuse, right total hip arthroplasty 06/03/2015 and was discharged to [facility]. Per chart review and patient, patient lives with spouse  independent prior to admission. One level home with 3 steps to entry. Presented 07/20/2016 with right-sided weakness and aphasia.Urine drug screen positive for cocaine. CT/MRI of the head showed acute moderate nonhemorrhagic left frontal lobe infarct. Patient did receive TPA.. CTA of head  and neck with no emergent large vessel occlusion or stenosis. EEG showed focal cerebral dysfunction over the left hemisphere suggestive of underlying structural or physiologic abnormality. No seizure activity noted. Venous Dopplers lower extremities negative for DVT.Echocardiogram with ejection fraction of 55%. Systolic function was normal. Neurology consulted maintained on Plavix and aspirin 325 mg daily for CVA prophylaxis.    Now requiring 24/7 Rehab RN,MD, as well as CIR level PT, OT and SLP.  Treatment team will focus on ADLs and mobility with goals set at Mod I  See Team Conference Notes for weekly updates to the plan of care

## 2016-07-28 NOTE — Progress Notes (Signed)
Subjective/Complaints: No issue overnite   ROS- difficult to obtain due to aphasia  Objective: Vital Signs: Blood pressure 136/73, pulse (!) 55, temperature 98 F (36.7 C), temperature source Oral, resp. rate 18, height '5\' 8"'  (1.727 m), weight 60.7 kg (133 lb 13.1 oz), SpO2 99 %. No results found. Results for orders placed or performed during the hospital encounter of 07/25/16 (from the past 72 hour(s))  CBC WITH DIFFERENTIAL     Status: Abnormal   Collection Time: 07/26/16  6:31 AM  Result Value Ref Range   WBC 5.1 4.0 - 10.5 K/uL   RBC 3.39 (L) 3.87 - 5.11 MIL/uL   Hemoglobin 11.3 (L) 12.0 - 15.0 g/dL   HCT 34.2 (L) 36.0 - 46.0 %   MCV 100.9 (H) 78.0 - 100.0 fL   MCH 33.3 26.0 - 34.0 pg   MCHC 33.0 30.0 - 36.0 g/dL   RDW 14.5 11.5 - 15.5 %   Platelets 229 150 - 400 K/uL   Neutrophils Relative % 54 %   Neutro Abs 2.8 1.7 - 7.7 K/uL   Lymphocytes Relative 28 %   Lymphs Abs 1.4 0.7 - 4.0 K/uL   Monocytes Relative 12 %   Monocytes Absolute 0.6 0.1 - 1.0 K/uL   Eosinophils Relative 5 %   Eosinophils Absolute 0.2 0.0 - 0.7 K/uL   Basophils Relative 1 %   Basophils Absolute 0.0 0.0 - 0.1 K/uL  Comprehensive metabolic panel     Status: Abnormal   Collection Time: 07/26/16  6:31 AM  Result Value Ref Range   Sodium 139 135 - 145 mmol/L   Potassium 3.9 3.5 - 5.1 mmol/L   Chloride 106 101 - 111 mmol/L   CO2 27 22 - 32 mmol/L   Glucose, Bld 100 (H) 65 - 99 mg/dL   BUN 18 6 - 20 mg/dL   Creatinine, Ser 1.11 (H) 0.44 - 1.00 mg/dL   Calcium 9.0 8.9 - 10.3 mg/dL   Total Protein 5.6 (L) 6.5 - 8.1 g/dL   Albumin 3.4 (L) 3.5 - 5.0 g/dL   AST 22 15 - 41 U/L   ALT 15 14 - 54 U/L   Alkaline Phosphatase 38 38 - 126 U/L   Total Bilirubin 0.6 0.3 - 1.2 mg/dL   GFR calc non Af Amer 50 (L) >60 mL/min   GFR calc Af Amer 58 (L) >60 mL/min    Comment: (NOTE) The eGFR has been calculated using the CKD EPI equation. This calculation has not been validated in all clinical  situations. eGFR's persistently <60 mL/min signify possible Chronic Kidney Disease.    Anion gap 6 5 - 15        Assessment/Plan: 1. 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 Physiatrist is providing close team supervision and 24 hour management of active medical problems listed below. Physiatrist and rehab team continue to assess barriers to discharge/monitor patient progress toward functional and medical goals. FIM: Function - Bathing Position: Shower Body parts bathed by patient: Right arm, Chest, Abdomen, Front perineal area, Buttocks, Right upper leg, Left upper leg Bathing not applicable: Left arm, Right lower leg, Left lower leg, Back Assist Level: Supervision or verbal cues (Mod assist, supervision for standing balance)  Function- Upper Body Dressing/Undressing What is the patient wearing?: Pull over shirt/dress Bra - Perfomed by patient: Thread/unthread right bra strap, Thread/unthread left bra strap  Bra - Perfomed by helper: Hook/unhook bra (pull down sports bra) Pull over shirt/dress - Perfomed by patient: Thread/unthread right sleeve, Thread/unthread left sleeve, Put head through opening, Pull shirt over trunk Assist Level: Touching or steadying assistance(Pt > 75%) Function - Lower Body Dressing/Undressing What is the patient wearing?: Underwear, Pants, Shoes Position: Wheelchair/chair at sink Underwear - Performed by patient: Thread/unthread right underwear leg, Thread/unthread left underwear leg, Pull underwear up/down Pants- Performed by patient: Thread/unthread right pants leg, Thread/unthread left pants leg, Pull pants up/down Non-skid slipper socks- Performed by patient: Don/doff right sock, Don/doff left sock Shoes - Performed by patient: Don/doff right shoe, Don/doff left shoe Shoes - Performed by helper:  Fasten right, Fasten left Assist for footwear: Partial/moderate assist Assist for lower body dressing: Touching or steadying assistance (Pt > 75%)  Function - Toileting Toileting steps completed by patient: Adjust clothing prior to toileting, Performs perineal hygiene, Adjust clothing after toileting Toileting Assistive Devices: Grab bar or rail Assist level: More than reasonable time  Function - Air cabin crew transfer assistive device: Grab bar Assist level to toilet: Supervision or verbal cues Assist level from toilet: Supervision or verbal cues  Function - Chair/bed transfer Chair/bed transfer method: Ambulatory Chair/bed transfer assist level: Supervision or verbal cues Chair/bed transfer assistive device: Armrests Chair/bed transfer details: Verbal cues for precautions/safety  Function - Locomotion: Wheelchair Will patient use wheelchair at discharge?: No Wheelchair activity did not occur: N/A (pt ambulatory on unit) Function - Locomotion: Ambulation Assistive device: No device Max distance: 400 Assist level: Supervision or verbal cues Assist level: Supervision or verbal cues Assist level: Supervision or verbal cues Assist level: Supervision or verbal cues Assist level: Supervision or verbal cues  Function - Comprehension Comprehension: Auditory Comprehension assist level: Understands basic 90% of the time/cues < 10% of the time  Function - Expression Expression: Verbal Expression assistive device:  (slurred speech, expressive aphasia) Expression assist level: Expresses basic 50 - 74% of the time/requires cueing 25 - 49% of the time. Needs to repeat parts of sentences.  Function - Social Interaction Social Interaction assist level: Interacts appropriately 50 - 74% of the time - May be physically or verbally inappropriate.  Function - Problem Solving Problem solving assist level: Solves basic problems with no assist  Function - Memory Memory assist level:  Recognizes or recalls 50 - 74% of the time/requires cueing 25 - 49% of the time Patient normally able to recall (first 3 days only): That he or she is in a hospital, Location of own room, Current season  Medical Problem List and Plan: 1.  Right hemiparesis and ?aphasia vs dysarthria secondary to left frontal infarct Cont CIR PT, OT SLP with tent d/c 12/20 2.  DVT Prophylaxis/Anticoagulation: Eliquis. Venous Doppler studies negative, Small nose bleed add Ocean spray 3. Pain Management/lupus: Ultram and Robaxin as needed. 4. Hypertension. Lisinopril 20 mg daily. Monitor with increased mobility 5. Neuropsych: This patient is capable of making decisions on her own behalf. 6. Skin/Wound Care: Routine skin checks  7. Fluids/Electrolytes/Nutrition: Routine I&O with follow-up chemistries 8. Hyperlipidemia. Lipitor 9. Tobacco/cocaine abuse. Nicoderm patch. Provide counseling 10. AKI: Follow up BMP.looks stable BMP Latest Ref Rng & Units 07/26/2016 07/24/2016 07/23/2016  Glucose 65 - 99 mg/dL 100(H) 108(H) 113(H)  BUN 6 - 20 mg/dL '18 10 11  ' Creatinine 0.44 - 1.00 mg/dL 1.11(H) 1.01(H) 0.87  Sodium 135 - 145 mmol/L 139 138 138  Potassium 3.5 - 5.1 mmol/L 3.9 3.7 4.0  Chloride 101 - 111 mmol/L 106  101 105  CO2 22 - 32 mmol/L '27 31 26  ' Calcium 8.9 - 10.3 mg/dL 9.0 9.0 9.3   11.  Afib with cocaine use probable causative factors  12.  Anemia will check stool guaic 13.  Spasticity R finger and wrist flexors order hand splint LOS (Days) 3 A FACE TO FACE EVALUATION WAS PERFORMED  Lonn Im E 07/28/2016, 7:45 AM

## 2016-07-28 NOTE — Progress Notes (Addendum)
Physical Therapy Note  Patient Details  Name: Jewelianna Pancoast MRN: [id number] Date of Birth: 03-20-1946 Today's Date: 07/28/2016  [id number], 60 min individual tx Pain: 8/10 HA, declined meds until end of session  Gait in room to/from toilet and sink.  Continent of B and B. Pt needed VC to use sink after toileting.  Gait over level tile without AD, carrying wash cloth in r hand, close supervusion.. PT provided external perturbations in all directions with pt standing: she demonstrated minimal L ankle strategy, immediate L stepping strategy and absent bil hip strategy.   neuromuscular re-education via forced use for sustained stretch x 3 minutes standing on wedge, with balance challenge : no UE support and reaching within with R hand  and out of BOS with  L hand.  With repeated posterior external perturbation, pt demonstrated brisk bil ankle strategy then L stepping response.  With multimodal cues, pt able to elicit bil hip strategy.  Seated activity with Rx bottles using R hand for gross grasp with demo and supervision. Floor transfer to mat with supervision, no cues.  R/L hip and knee extension x 10 each  from quadruped > 3 point.  4 point with = wt bearing R/LUE during rocking forward/backward.  Pt had to use toilet urgently.  Carrying wooden box using bil hands, gait on level tile to return to room.  continent for B and B again.  Pt left resting in bed with alarm set. Paulita Cradle, NT in room.  See function navigator for current status.  Shulamis Wenberg 07/28/2016, 7:59 AM

## 2016-07-28 NOTE — Progress Notes (Signed)
Orthopedic Tech Progress Note Patient Details:  Andrea Kennedy 1946/01/24 [id number]  Patient ID: [name], female   DOB: 02/25/1946, 70 y.o.   MRN: [id number]   [name] 07/28/2016, 10:42 AM Called in advanced brace order; spoke with Carolyne Fiscal

## 2016-07-28 NOTE — Plan of Care (Signed)
Problem: RH BOWEL ELIMINATION Goal: RH STG MANAGE BOWEL WITH ASSISTANCE STG Manage Bowel with mod. I Assistance.    Outcome: Not Progressing Required sorbitol to have BM today

## 2016-07-28 NOTE — Progress Notes (Signed)
Orthopedic Tech Progress Note Patient Details:  Daziya Redmond Feb 12, 1946 [id number] Brace completed by [employer] Patient ID: [name], female   DOB: 1945-08-31, 70 y.o.   MRN: [id number]   Braulio Bosch 07/28/2016, 3:01 PM

## 2016-07-28 NOTE — Progress Notes (Signed)
Occupational Therapy Session Note  Patient Details  Name: Andrea Kennedy MRN: [id number] Date of Birth: May 26, 1946  Today's Date: 07/28/2016 OT Individual Time: 1105-1200 and 1300-1345 OT Individual Time Calculation (min): 55 min and 45 min    Short Term Goals: Week 1:  OT Short Term Goal 1 (Week 1): STG = LTGs due to short ELOS  Skilled Therapeutic Interventions/Progress Updates:    1) Treatment session with focus on RUE NMR with focus on grasp and release as well as pinch grasp.  Pt received reclined in bed, deferring bathing and dressing this session as she is already dressed.  Ambulated to therapy gym with supervision, min cues to scan to Rt to avoid objects and people on Rt.  Attempted to pick up medium sized items with Rt hand with pt demonstrating difficulty with thumb abduction to pick up items.  Educated on e-stim to which pt willing to attempt.  Setup Bioness with focus on finger flexion/extension in exercise mode and key pinch to address thumb abduction.  Pt reports enjoyment and satisfaction in seeing fingers move.  Engaged in picking up items while Bioness donned with min assist for positioning.  Once Bioness doffed, attempted finger extension and thumb abduction with no carryover.  2) Treatment session with focus on dynamic standing balance during corn hole activity.  Pt received in w/c with husband and mother in law present for unit Christmas party.  Pt ambulated to Dayroom > 150 feet with supervision/cues to attend to people and obstacles on Rt.  Engaged in corn hole activity with focus on pt maintaining hold of bean bags in RUE (braced against torso) while tossing bags with Lt hand.  Attempted x3 to toss with Rt hand with decreased strength and trajectory.  Pt with 1 LOB when attempting to step across corn hole board requiring mod assist to correct.  Pt left at party with family present.  Therapy Documentation Precautions:  Precautions Precautions: Fall Precaution Comments:  hemiparesis RUE>RLE, mild unsteadiness with gait Restrictions Weight Bearing Restrictions: No General:   Vital Signs: Therapy Vitals Temp: 97.8 F (36.6 C) Temp Source: Oral Pulse Rate: 60 Resp: 18 BP: 108/61 Patient Position (if appropriate): Lying Oxygen Therapy SpO2: 100 % O2 Device: Not Delivered Pain:  Pt with no c/o pain  See Function Navigator for Current Functional Status.   Therapy/Group: Individual Therapy  Simonne Come 07/28/2016, 3:41 PM

## 2016-07-28 NOTE — Progress Notes (Signed)
Physical Therapy Session Note  Patient Details  Name: Andrea Kennedy MRN: [id number] Date of Birth: September 12, 1945  Today's Date: 07/28/2016 PT Individual Time: 7741-2878 PT Individual Time Calculation (min): 35 min    Short Term Goals: Week 1:  PT Short Term Goal 1 (Week 1): =LTGs due to ELOS  Skilled Therapeutic Interventions/Progress Updates:    Pt resting in bed on arrival, no c/o pain and agreeable to therapy session.  Focus on ambulation in controlled environment with cues for increase RLE step length, gait speed, and RUE reciprocal arm swing, functional use of RUE to wipe white board clean and brush hair, and AROM supination/pronation and attempted fisting of R hand for increased use in functional tasks.  Pt tearful with lack of progress with RUE and PT provided emotional support and encouraged pt to attempt to use RUE in functional self care tasks daily to improve function.  Pt verbalized understanding.  Left sitting EOB with call bell inr each and needs met.   Therapy Documentation Precautions:  Precautions Precautions: Fall Precaution Comments: hemiparesis RUE>RLE, mild unsteadiness with gait Restrictions Weight Bearing Restrictions: No \  See Function Navigator for Current Functional Status.   Therapy/Group: Individual Therapy  Earnest Conroy Penven-Crew 07/28/2016, 4:46 PM

## 2016-07-29 ENCOUNTER — Inpatient Hospital Stay (HOSPITAL_COMMUNITY): Payer: Medicare HMO | Admitting: Physical Therapy

## 2016-07-29 ENCOUNTER — Inpatient Hospital Stay (HOSPITAL_COMMUNITY): Payer: Medicare HMO | Admitting: Occupational Therapy

## 2016-07-29 ENCOUNTER — Inpatient Hospital Stay (HOSPITAL_COMMUNITY): Payer: Medicare HMO | Admitting: Speech Pathology

## 2016-07-29 DIAGNOSIS — N179 Acute kidney failure, unspecified: Secondary | ICD-10-CM

## 2016-07-29 DIAGNOSIS — R252 Cramp and spasm: Secondary | ICD-10-CM

## 2016-07-29 DIAGNOSIS — I69322 Dysarthria following cerebral infarction: Secondary | ICD-10-CM

## 2016-07-29 NOTE — Progress Notes (Signed)
Physical Therapy Session Note  Patient Details  Name: Andrea Kennedy MRN: [id number] Date of Birth: 06/15/1946  Today's Date: 07/29/2016 PT Individual Time: 0905-1000 PT Individual Time Calculation (min): 55 min    Short Term Goals: Week 1:  PT Short Term Goal 1 (Week 1): =LTGs due to ELOS  Skilled Therapeutic Interventions/Progress Updates:    Pt resting in bed on arrival with no c/o pain and agreeable to therapy session.  Session focus on ambulation in controlled environment with dual task of conversation, NMR on biodex for limits of stability training with mod fade to min verbal cues to increase hip strategy, and NMR on Wii fit for hula hoop game and tilt table game again with min cues for hip strategy.  Pt with improvement in scores on both activities indicating improved carryover with practice.  Pt returned to room at end of session and positioned in bed with call bell in reach and needs met.   Therapy Documentation Precautions:  Precautions Precautions: Fall Precaution Comments: hemiparesis RUE>RLE, mild unsteadiness with gait Restrictions Weight Bearing Restrictions: No   See Function Navigator for Current Functional Status.   Therapy/Group: Individual Therapy  Andrea Kennedy 07/29/2016, 10:02 AM

## 2016-07-29 NOTE — Progress Notes (Signed)
Speech Language Pathology Daily Session Note  Patient Details  Name: Andrea Kennedy MRN: [id number] Date of Birth: May 13, 1946  Today's Date: 07/29/2016 SLP Individual Time: 1050-1130 SLP Individual Time Calculation (min): 40 min   Short Term Goals: Week 1: SLP Short Term Goal 1 (Week 1): short term goals = long term goals due to length of stay   Skilled Therapeutic Interventions: Pt was seen for skilled ST targeting communication goals.  Pt was ~80% intelligible during 1:1 conversations when face to face with therapist with supervision verbal cues for overarticulation and slow rate.  SLP facilitated the session with a novel picture description task with the use of a visual barrier to target use of intelligibility strategies in a complex communicative context.  Pt needed min assist verbal cues in a moderately noisy environment to achieve intelligibility at the phrase level.  Pt is demonstrating improved monitoring and correction of verbal errors during both structured and unstructured tasks and she reports her family members appear to be able to understand her better as well.  Pt was returned to room and left in bed with call bell within each.       Function:  Eating Eating   Modified Consistency Diet: Yes Eating Assist Level: Set up assist for   Eating Set Up Assist For: Opening containers       Cognition Comprehension Comprehension assist level: Follows basic conversation/direction with extra time/assistive device  Expression   Expression assist level: Expresses basic 50 - 74% of the time/requires cueing 25 - 49% of the time. Needs to repeat parts of sentences.  Social Interaction Social Interaction assist level: Interacts appropriately 90% of the time - Needs monitoring or encouragement for participation or interaction.  Problem Solving Problem solving assist level: Solves basic problems with no assist  Memory Memory assist level: Recognizes or recalls 90% of the time/requires  cueing < 10% of the time    Pain Pain Assessment Pain Assessment: No/denies pain Pain Score: 0-No pain  Therapy/Group: Individual Therapy  Sal Spratley, Selinda Orion 07/29/2016, 12:32 PM

## 2016-07-29 NOTE — Progress Notes (Signed)
Patient had small nosebleed this am

## 2016-07-29 NOTE — Progress Notes (Signed)
Subjective/Complaints: Pt seen sitting up at the edge of her bed this AM working with OT.  She slept well overnight.  She believes her speech is improving. She notes improvement with resting hand splint   ROS: Denies CP, SOB, N/V/D.  Objective: Vital Signs: Blood pressure (!) 126/56, pulse (!) 56, temperature 98.1 F (36.7 C), temperature source Oral, resp. rate 17, height '5\' 8"'$  (1.727 m), weight 59.7 kg (131 lb 9.8 oz), SpO2 99 %. No results found. No results found for this or any previous visit (from the past 72 hour(s)).  Constitutional: She appears well-developed. NAD. HENT: Normocephalic and atraumatic.  Eyes: EOM are normal. No discharge.  Cardiovascular: Irregular irregular  Respiratory: Effort normal and breath sounds normal.  GI: Soft. Bowel sounds are normal. She exhibits no distension. There is no tenderness.  Musculoskeletal: She exhibits no edema or tenderness.  Neurological:  She is alert and oriented.  Dysarthria with apraxia vs. Expressive Aphasia Motor: RUE: shoulder abduction, elbow flexion/extension 3+/5, wrist/hand 2/5 LUE: 5/5 proximal to distal RLE: 4+/5 proximal to distal  LLE:5/5 proximal to distal Sensation intact to light touch Skin: Skin is warm and dry.  Psychiatric: She has a normal mood and affect. Her behavior is normal.    Assessment/Plan: 1. 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 Physiatrist is providing close team supervision and 24 hour management of active medical problems listed below. Physiatrist and rehab team continue to assess barriers to discharge/monitor patient progress toward functional and medical goals. FIM: Function - Bathing Position: Shower Body parts bathed by patient: Right arm, Chest, Abdomen, Front perineal area, Buttocks, Right upper leg, Left upper leg, Left  arm, Right lower leg, Left lower leg, Back Bathing not applicable: Left arm, Right lower leg, Left lower leg, Back Assist Level: Supervision or verbal cues  Function- Upper Body Dressing/Undressing What is the patient wearing?: Bra, Pull over shirt/dress Bra - Perfomed by patient: Thread/unthread right bra strap, Thread/unthread left bra strap Bra - Perfomed by helper: Hook/unhook bra (pull down sports bra) Pull over shirt/dress - Perfomed by patient: Thread/unthread right sleeve, Thread/unthread left sleeve, Put head through opening, Pull shirt over trunk Assist Level: Touching or steadying assistance(Pt > 75%) Function - Lower Body Dressing/Undressing What is the patient wearing?: Underwear, Pants, Non-skid slipper socks Position: Sitting EOB Underwear - Performed by patient: Thread/unthread right underwear leg, Thread/unthread left underwear leg, Pull underwear up/down Pants- Performed by patient: Thread/unthread right pants leg, Thread/unthread left pants leg, Pull pants up/down Non-skid slipper socks- Performed by patient: Don/doff right sock, Don/doff left sock Shoes - Performed by patient: Don/doff right shoe, Don/doff left shoe Shoes - Performed by helper: Fasten right, Fasten left Assist for footwear: Setup Assist for lower body dressing: Supervision or verbal cues  Function - Toileting Toileting steps completed by patient: Adjust clothing prior to toileting, Performs perineal hygiene, Adjust clothing after toileting Toileting steps completed by helper: Adjust clothing prior to toileting, Performs perineal hygiene, Adjust clothing after toileting (per Terrilee Files, NT) Toileting Assistive Devices: Grab bar or rail Assist level: More than reasonable time, Supervision or verbal cues  Function - Air cabin crew transfer assistive device: Grab bar Assist level to toilet: Supervision or verbal cues Assist level from toilet: Supervision or verbal cues  Function - Chair/bed  transfer Chair/bed transfer method: Ambulatory Chair/bed transfer assist level: Supervision or verbal cues Chair/bed transfer assistive  device: Armrests Chair/bed transfer details: Verbal cues for precautions/safety  Function - Locomotion: Wheelchair Will patient use wheelchair at discharge?: No Wheelchair activity did not occur: N/A (pt ambulatory on unit) Function - Locomotion: Ambulation Assistive device: No device Max distance: 300 Assist level: Supervision or verbal cues Assist level: Supervision or verbal cues Assist level: Supervision or verbal cues Assist level: Supervision or verbal cues Assist level: Supervision or verbal cues  Function - Comprehension Comprehension: Auditory Comprehension assist level: Follows basic conversation/direction with extra time/assistive device  Function - Expression Expression: Verbal Expression assistive device:  (slurred speech, expressive aphasia) Expression assist level: Expresses basic 50 - 74% of the time/requires cueing 25 - 49% of the time. Needs to repeat parts of sentences.  Function - Social Interaction Social Interaction assist level: Interacts appropriately 90% of the time - Needs monitoring or encouragement for participation or interaction.  Function - Problem Solving Problem solving assist level: Solves basic problems with no assist  Function - Memory Memory assist level: Recognizes or recalls 90% of the time/requires cueing < 10% of the time Patient normally able to recall (first 3 days only): That he or she is in a hospital, Location of own room, Current season  Medical Problem List and Plan: 1.  Right hemiparesis and ?aphasia vs dysarthria secondary to left frontal infarct  Cont CIR 2.  DVT Prophylaxis/Anticoagulation: Eliquis. Venous Doppler studies negative 3. Pain Management/lupus: Ultram and Robaxin as needed. 4. Hypertension. Lisinopril 20 mg daily. Monitor with increased mobility  Controlled 12/15 5.  Neuropsych: This patient is capable of making decisions on her own behalf. 6. Skin/Wound Care: Routine skin checks  7. Fluids/Electrolytes/Nutrition: Routine I&Os 8. Hyperlipidemia. Lipitor 9. Tobacco/cocaine abuse. Nicoderm patch. Provide counseling 10. AKI:   Encourage fluid intake BMP Latest Ref Rng & Units 07/26/2016 07/24/2016 07/23/2016  Glucose 65 - 99 mg/dL 100(H) 108(H) 113(H)  BUN 6 - 20 mg/dL '18 10 11  '$ Creatinine 0.44 - 1.00 mg/dL 1.11(H) 1.01(H) 0.87  Sodium 135 - 145 mmol/L 139 138 138  Potassium 3.5 - 5.1 mmol/L 3.9 3.7 4.0  Chloride 101 - 111 mmol/L 106 101 105  CO2 22 - 32 mmol/L '27 31 26  '$ Calcium 8.9 - 10.3 mg/dL 9.0 9.0 9.3   11.  Afib with cocaine use probable causative factors  12.  Macrocytic anemia  Hb 11.3 on 12/12  Cont to monitor 13.  Spasticity R finger and wrist flexors  Hand splint with improvement 14.  Small nose bleed add Ocean spray, pt has not tried yes  LOS (Days) 4 A FACE TO FACE EVALUATION WAS PERFORMED  Stellan Vick Lorie Phenix 07/29/2016, 8:26 AM

## 2016-07-29 NOTE — Progress Notes (Signed)
Occupational Therapy Session Note  Patient Details  Name: Andrea Kennedy MRN: [id number] Date of Birth: 1945-11-28  Today's Date: 07/29/2016 OT Individual Time: 6387-5643 and 3295-1884 OT Individual Time Calculation (min): 45 min and 40 min    Short Term Goals:Week 1:  OT Short Term Goal 1 (Week 1): STG = LTGs due to short ELOS  Skilled Therapeutic Interventions/Progress Updates:    1) Treatment session with focus on functional use of RUE during self-care tasks and dynamic standing balance.  Pt ambulated around room without AD to gather clothing prior to bathing at shower level.  Min cues to utilize RUE as gross assist to transport clothing items.  Bathing completed at sit > stand level with use of RUE as gross assist to wash Lt arm.  Pt bent down to retrieve clothing from floor post shower without LOB.  Hand over hand to hold bra strap in Rt hand to maintain grasp to allow pt to fasten bra prior to donning.  Pt donned rest of clothing at sit > stand level without assistance.  Pt able to brush hair without assist, but requires assist if pulling hair back.  Setup for breakfast, with pt unable to sustain grasp on utensils with Rt hand, therefore feeding self with Lt hand.  2) Treatment session with focus on RUE NMR.  Ambulated to therapy gym with supervision, min cues for Rt attention as pt cutting corners close on Rt. Utilized Bioness for [employer] with focus on finger extension as well as exercises mode for flexion and extension.  Modified setup of Bioness for more radial electrode placement, however returned to previous setup with improved activation of finger extension.  Utilized Bioness with stacking cups with pt able to open hand with stimulation to grasp cups and then maintain grasp while stacking cups.  Pt tolerated stimulation without any pain.  Unable to elicit any finger extension or flexion once Bioness removed.  Returned to room ambulating with supervision.  Completed toilet transfer and  toileting with distant supervision.  Therapy Documentation Precautions:  Precautions Precautions: Fall Precaution Comments: hemiparesis RUE>RLE, mild unsteadiness with gait Restrictions Weight Bearing Restrictions: No General:   Vital Signs: Therapy Vitals Temp: 98.1 F (36.7 C) Temp Source: Oral Pulse Rate: (!) 56 Resp: 17 BP: (!) 126/56 Patient Position (if appropriate): Lying Oxygen Therapy SpO2: 99 % O2 Device: Not Delivered Pain:  Pt with no c/o pain  See Function Navigator for Current Functional Status.   Therapy/Group: Individual Therapy  Simonne Come 07/29/2016, 7:55 AM

## 2016-07-30 ENCOUNTER — Inpatient Hospital Stay (HOSPITAL_COMMUNITY): Payer: Medicare HMO | Admitting: Speech Pathology

## 2016-07-30 ENCOUNTER — Inpatient Hospital Stay (HOSPITAL_COMMUNITY): Payer: Medicare HMO

## 2016-07-30 ENCOUNTER — Inpatient Hospital Stay (HOSPITAL_COMMUNITY): Payer: Medicare HMO | Admitting: Physical Therapy

## 2016-07-30 NOTE — Progress Notes (Signed)
   Subjective/Complaints: Patient feels well. No complaints. She feels like she is improving.    Objective: Vital Signs: Blood pressure (!) 92/52, pulse (!) 56, temperature 97.6 F (36.4 C), temperature source Oral, resp. rate 17, height '5\' 8"'$  (1.727 m), weight 131 lb 9.8 oz (59.7 kg), SpO2 97 %.  No acute distress. HEENT exam: Atraumatic, normocephalic, neck supple. No lymphadenopathy or thyromegaly. No jugular venous distention. Chest clear to auscultation Cardiac exam S1 and S2 are regular Abdominal exam bowel sounds, soft, nontender Extremities no clubbing cyanosis or edema. Neurologic she has a right facial droop. Speech is slurred and deliberate.  Assessment/Plan: 1. 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  Right hemiparesis and ?aphasia vs dysarthria secondary to left frontal infarct  Cont CIR 2.  DVT Prophylaxis/Anticoagulation: Eliquis. Venous Doppler studies negative 3. Pain Management/lupus: Ultram and Robaxin as needed. 4. Hypertension. 92/52-98/56 Will monitor. She doesn't have any signs or symptoms of hypotension. 5. Neuropsych: This patient is capable of making decisions on her own behalf. 6. Skin/Wound Care: Routine skin checks  7. Fluids/Electrolytes/Nutrition: Routine I&Os 8. Hyperlipidemia. Lipitor 9. Tobacco/cocaine abuse. Nicoderm patch. 10. AKI:   Encourage fluid intake BMP Latest Ref Rng & Units 07/26/2016 07/24/2016 07/23/2016  Glucose 65 - 99 mg/dL 100(H) 108(H) 113(H)  BUN 6 - 20 mg/dL '18 10 11  '$ Creatinine 0.44 - 1.00 mg/dL 1.11(H) 1.01(H) 0.87  Sodium 135 - 145 mmol/L 139 138 138  Potassium 3.5 - 5.1 mmol/L 3.9 3.7 4.0  Chloride 101 - 111 mmol/L 106 101 105  CO2 22 - 32 mmol/L '27 31 26  '$ Calcium 8.9 - 10.3 mg/dL 9.0 9.0 9.3   Lab Results  Component Value Date   CREATININE 1.11 (H) 07/26/2016    11.  Afib with cocaine use probable causative factors    12.  Macrocytic anemia  Hb 11.3 on 12/12  Cont to monitor 13.  Spasticity R finger and wrist flexors  Hand splint with improvement 14.  Small nose bleed add Ocean spray, pt has not tried yet  LOS (Days) 5 A FACE TO FACE EVALUATION WAS PERFORMED  Andrea Kennedy 07/30/2016, 9:26 AM

## 2016-07-30 NOTE — Progress Notes (Signed)
Occupational Therapy Session Note  Patient Details  Name: Destenee Guerry MRN: [id number] Date of Birth: [birth year]  Today's Date: 07/30/2016 OT Individual Time:1100-1200 60 minutes     Short Term Goals: Week 1:  OT Short Term Goal 1 (Week 1): STG = LTGs due to short ELOS  Skilled Therapeutic Interventions/Progress Updates:   ADL-retraining (15 min) with focus on safety awareness, transfers, functional mobility and RUE NMR.   Pt received at bedside awaiting therapist.   Pt reports completion of BADL prior to session although requesting assist to bathroom to toilet.   Pt ambulated w/o device w/o evidence of instability or LOB.   Pt able to transfer and toilet unassisted and returned to sink to perform hand hygiene and grooming.   OT re-educated pt on bilateral activity to incorporate use of RUE with hand guidance to use left UE to assist.   Pt demonstrates sufficient shoulder and elbow AROM and strength but lacks wrist and grip strength to effectively use right hand (dominant).   Pt reports some improvement with right hand following NMES using bioness.   Pt escorted to gym for therapeutic activity and continued NMES using Bioness, 20  Minutes total time, exercise, extension and grasp modes with full wrist, finger and thumb extension observed at grade 9 intensity.    Following NMES, OT demonstrates use of foam tubing and universal cuff to promote BADL using right hand.   OT reinforced need for incorporation of right hand with pt's husband who arrived near end of session.    Therapy Documentation Precautions:  Precautions Precautions: Fall Precaution Comments: hemiparesis RUE>RLE, mild unsteadiness with gait Restrictions Weight Bearing Restrictions: No   Vital Signs: Therapy Vitals Temp: 97.6 F (36.4 C) Temp Source: Oral Pulse Rate: (!) 56 Resp: 17 BP: (!) 92/52 Patient Position (if appropriate): Lying Oxygen Therapy SpO2: 97 % O2 Device: Not Delivered   Pain: No/denies pain  See  Function Navigator for Current Functional Status.   Therapy/Group: Individual Therapy   Second session: Time: 1300-1400 OT Group Time:  60 min  Pain Assessment: No/Denies pain  Skilled Therapeutic Interventions: Patient participated in therapeutic group activities presented to include dynamic standing balance task during kitchen activities (in ADL apartment), home safety re-ed session to include fall recovery, discharge planning relating to delegation of select tasks to family members, assessment of balance deficits and remediation strategies using Nintendo Wii and Balance board.   Pt demo'd ability to reach to lower cabinets and upper cabinets with left hand with reinforcement training on bilateral activities to enhance NMR of RUE, maintain stability using countertops, perform fall recovery unassisted and verbalize understanding of role of core strength relating to weight-shifting and dynamic standing balance.   Pt affirmed need for delegation of components of meal prep to her husband or mother-in-law for safety although stating that her mother-in-law, though capable, expects assistance for her own needs.  OT demo'd use of AE for kitchen safety to include cutting board with pegs however pt admits fearfulness relating to dropping object during session.  See FIM for current functional status  Therapy/Group: Cherrie Gauze 07/30/2016, 7:14 AM

## 2016-07-30 NOTE — Progress Notes (Signed)
Speech Language Pathology Daily Session Note  Patient Details  Name: Andrea Kennedy MRN: [id number] Date of Birth: 06-Aug-1946  Today's Date: 07/30/2016 SLP Individual Time: 0905-0930 SLP Individual Time Calculation (min): 25 min   Short Term Goals: Week 1: SLP Short Term Goal 1 (Week 1): short term goals = long term goals due to length of stay   Skilled Therapeutic Interventions:  Pt was seen for skilled ST targeting goals for dysphagia and communication.  Therapist facilitated the session with a trial snack of regular textures to continue working towards diet progression.  Pt could verbalize and utilized recommended swallowing precautions with mod I during snack to facilitate clearance of advanced solids from the oral cavity; however, she endorses difficulty with Dys 3 sausage and cooked vegetables and overall decreased intake with weight loss due to prolonged and effortful mastication.  As a result, do not recommend advancement at this time for energy conservation to ensure pt continues to receive adequate nutrition.  Pt needed min assist-supervision verbal cues for use of overarticulation and slow rate to achieve intelligibility during conversations with therapist and visitors.  Pt was left in bed with bed alarm set and call bell within reach. Continue per current plan of care.         Function:  Eating Eating   Modified Consistency Diet: Yes Eating Assist Level: More than reasonable amount of time           Cognition Comprehension Comprehension assist level: Follows complex conversation/direction with extra time/assistive device  Expression   Expression assist level: Expresses basic 75 - 89% of the time/requires cueing 10 - 24% of the time. Needs helper to occlude trach/needs to repeat words.  Social Interaction Social Interaction assist level: Interacts appropriately 90% of the time - Needs monitoring or encouragement for participation or interaction.  Problem Solving Problem  solving assist level: Solves basic problems with no assist  Memory Memory assist level: Recognizes or recalls 90% of the time/requires cueing < 10% of the time    Pain Pain Assessment Pain Assessment: No/denies pain Pain Score: 0-No pain  Therapy/Group: Individual Therapy  Cyrilla Durkin, Selinda Orion 07/30/2016, 12:49 PM

## 2016-07-30 NOTE — Progress Notes (Signed)
Physical Therapy Session Note  Patient Details  Name: Andrea Kennedy MRN: [id number] Date of Birth: [birth year]  Today's Date: 07/30/2016  Short Term Goals: Week 1:  PT Short Term Goal 1 (Week 1): =LTGs due to ELOS  Skilled Therapeutic Interventions/Progress Updates:     PT Concurrent Time: 1400-1415 PT ConcurrentTime Calculation (min): 15 min Patient in gym, handoff from OT. Patient performed standing NMR with > without UE support on [employer] for limits of stability training and multidirectional weight shifting on stable surface progressed to unstable surface with good carryover from previous sessions for balance strategies.  PT Individual Time: 1415-1430 PT Individual Time Calculation (min): 15 min  Patient requesting to utilize NuStep for RLE and RUE strengthening due to soreness. Performed NuStep using BUE/BLE at level 4 x 8 min with with RUE attachment to maintain grip. Patient ambulated back to room without device with mod I and left sitting on toilet with nurse tech in room.   Therapy Documentation Precautions:  Precautions Precautions: Fall Precaution Comments: hemiparesis RUE>RLE, mild unsteadiness with gait Restrictions Weight Bearing Restrictions: No Pain: Pain Assessment Pain Assessment: No/denies pain   See Function Navigator for Current Functional Status.   Therapy/Group: Individual Therapy  Mee Macdonnell, Murray Hodgkins 07/30/2016, 3:02 PM

## 2016-07-31 NOTE — Progress Notes (Signed)
   Subjective/Complaints: No complaints this a.m. No epistaxis  Objective: Vital Signs: Blood pressure 102/76, pulse (!) 42, temperature 97.5 F (36.4 C), temperature source Oral, resp. rate 18, height '5\' 8"'$  (1.727 m), weight 131 lb 9.8 oz (59.7 kg), SpO2 98 %.   Well-developed well-nourished female in no acute distress. HEENT exam atraumatic, normocephalic, extraocular muscles are intact. Neck is supple. No jugular venous distention no thyromegaly. Chest clear to auscultation without increased work of breathing. Cardiac exam S1 and S2 are regular. Abdominal exam active bowel sounds, soft, nontender. Extremities no edema.  Neurologic she has a right facial droop. Speech is slurred and deliberate.  Assessment/Plan: 1. 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  Right hemiparesis and ?aphasia vs dysarthria secondary to left frontal infarct  Cont CIR 2.  DVT Prophylaxis/Anticoagulation: Eliquis. Venous Doppler studies negative 3. Pain Management/lupus: Ultram and Robaxin as needed. 4. Hypertension. 570/17-793/90 Will monitor. She doesn't have any signs or symptoms of hypotension. 5. Neuropsych: This patient is capable of making decisions on her own behalf. 6. Skin/Wound Care: Routine skin checks  7. Fluids/Electrolytes/Nutrition: Routine I&Os 8. Hyperlipidemia. Lipitor 9. Tobacco/cocaine abuse. Nicoderm patch. 10. AKI:   Encourage fluid intake BMP Latest Ref Rng & Units 07/26/2016 07/24/2016 07/23/2016  Glucose 65 - 99 mg/dL 100(H) 108(H) 113(H)  BUN 6 - 20 mg/dL '18 10 11  '$ Creatinine 0.44 - 1.00 mg/dL 1.11(H) 1.01(H) 0.87  Sodium 135 - 145 mmol/L 139 138 138  Potassium 3.5 - 5.1 mmol/L 3.9 3.7 4.0  Chloride 101 - 111 mmol/L 106 101 105  CO2 22 - 32 mmol/L '27 31 26  '$ Calcium 8.9 - 10.3 mg/dL 9.0 9.0 9.3   Lab Results  Component Value Date   CREATININE 1.11 (H) 07/26/2016    11.  Afib with  cocaine use probable causative factors of stroke 12.  Macrocytic anemia  Hb 11.3 on 12/12  Cont to monitor 13.  Spasticity R finger and wrist flexors  Hand splint with improvement 14.  Small nose bleed no recurrence  LOS (Days) 6 A FACE TO FACE EVALUATION WAS PERFORMED  Andrea Kennedy H Andrea Kennedy 07/31/2016, 6:35 AM

## 2016-08-01 ENCOUNTER — Inpatient Hospital Stay (HOSPITAL_COMMUNITY): Payer: Medicare HMO | Admitting: Physical Therapy

## 2016-08-01 ENCOUNTER — Inpatient Hospital Stay (HOSPITAL_COMMUNITY): Payer: Medicare HMO | Admitting: Occupational Therapy

## 2016-08-01 ENCOUNTER — Inpatient Hospital Stay (HOSPITAL_COMMUNITY): Payer: Medicare HMO | Admitting: Speech Pathology

## 2016-08-01 DIAGNOSIS — I69320 Aphasia following cerebral infarction: Secondary | ICD-10-CM

## 2016-08-01 LAB — OCCULT BLOOD X 1 CARD TO LAB, STOOL: Fecal Occult Bld: POSITIVE — AB

## 2016-08-01 MED ORDER — LISINOPRIL 10 MG PO TABS
10.0000 mg | ORAL_TABLET | Freq: Every day | ORAL | Status: DC
  Administered 2016-08-01 – 2016-08-02 (×2): 10 mg via ORAL
  Filled 2016-08-01 (×2): qty 1

## 2016-08-01 MED ORDER — NICOTINE 14 MG/24HR TD PT24
14.0000 mg | MEDICATED_PATCH | Freq: Every day | TRANSDERMAL | Status: DC
  Administered 2016-08-01 – 2016-08-03 (×3): 14 mg via TRANSDERMAL
  Filled 2016-08-01 (×3): qty 1

## 2016-08-01 NOTE — Progress Notes (Signed)
Occupational Therapy Session Note  Patient Details  Name: Andrea Kennedy MRN: [id number] Date of Birth: 08-20-1945  Today's Date: 08/01/2016 OT Individual Time: 4210-3128 OT Individual Time Calculation (min): 45 min     Short Term Goals:Week 1:  OT Short Term Goal 1 (Week 1): STG = LTGs due to short ELOS  Skilled Therapeutic Interventions/Progress Updates:    Treatment session with focus on increased use of RUE during self-care tasks.  Pt finishing breakfast upon arrival, utilizing LUE only.  Pt gathered clothing prior to bathing in room shower with ambulation at overall Mod I level in room, demonstrating use of RUE as gross assist to transport items.  Bathing completed at sit > stand level in room shower at overall Mod I level.  Dressing completed at Mod I level with exception of supervision/setup and cues for technique to increase success with donning bra.  Pt unable to fasten bra in front of self, therefore educated on alternative methods with pt able to fasten bra in lap with use of RUE as gross assist to stabilize loop side folded to show only the loops while hook side in Lt hand then pull bra over head.  Grooming tasks completed in standing without assistance and use of RUE as gross assist.  Left seated in w/c as pt reports feeling confined to the bed.  Therapy Documentation Precautions:  Precautions Precautions: Fall Precaution Comments: hemiparesis RUE>RLE, mild unsteadiness with gait Restrictions Weight Bearing Restrictions: No General:   Vital Signs: Therapy Vitals Temp: 97.7 F (36.5 C) Temp Source: Oral Pulse Rate: 62 Resp: 16 BP: 91/61 Patient Position (if appropriate): Lying Oxygen Therapy SpO2: 99 % Pain:   ADL:   Exercises:   Other Treatments:    See Function Navigator for Current Functional Status.   Therapy/Group: Individual Therapy  Simonne Come 08/01/2016, 8:07 AM

## 2016-08-01 NOTE — Progress Notes (Signed)
Subjective/Complaints: Sppech intelligibility remains poor ok with Y/N but is verbose  ROS: Denies CP, SOB, N/V/D.  Objective: Vital Signs: Blood pressure 91/61, pulse 62, temperature 97.7 F (36.5 C), temperature source Oral, resp. rate 16, height '5\' 8"'$  (1.727 m), weight 59.7 kg (131 lb 9.8 oz), SpO2 99 %. No results found. No results found for this or any previous visit (from the past 72 hour(s)).  Constitutional: She appears well-developed. NAD. HENT: Normocephalic and atraumatic.  Eyes: EOM are normal. No discharge.  Cardiovascular: Irregular irregular  Respiratory: Effort normal and breath sounds normal.  GI: Soft. Bowel sounds are normal. She exhibits no distension. There is no tenderness.  Musculoskeletal: She exhibits no edema or tenderness.  Neurological:  She is alert and oriented.  Dysarthria with apraxia vs. Expressive Aphasia Motor: RUE: shoulder abduction, elbow flexion/extension 3+/5, wrist/hand 2/5 LUE: 5/5 proximal to distal RLE: 4+/5 proximal to distal  LLE:5/5 proximal to distal Sensation intact to light touch Skin: Skin is warm and dry.  Psychiatric: She has a normal mood and affect. Her behavior is normal.    Assessment/Plan: 1. 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 Physiatrist is providing close team supervision and 24 hour management of active medical problems listed below. Physiatrist and rehab team continue to assess barriers to discharge/monitor patient progress toward functional and medical goals. FIM: Function - Bathing Position: Shower Body parts bathed by patient: Right arm, Chest, Abdomen, Front perineal area, Buttocks, Right upper leg, Left upper leg, Left arm, Right lower leg, Left lower leg, Back Bathing not applicable: Left arm, Right lower leg, Left lower leg, Back Assist Level:  Supervision or verbal cues  Function- Upper Body Dressing/Undressing What is the patient wearing?: Bra, Pull over shirt/dress Bra - Perfomed by patient: Thread/unthread right bra strap, Thread/unthread left bra strap Bra - Perfomed by helper: Hook/unhook bra (pull down sports bra) Pull over shirt/dress - Perfomed by patient: Thread/unthread right sleeve, Thread/unthread left sleeve, Put head through opening, Pull shirt over trunk Assist Level: Touching or steadying assistance(Pt > 75%) Function - Lower Body Dressing/Undressing What is the patient wearing?: Underwear, Pants, Non-skid slipper socks Position: Sitting EOB Underwear - Performed by patient: Thread/unthread right underwear leg, Thread/unthread left underwear leg, Pull underwear up/down Pants- Performed by patient: Thread/unthread right pants leg, Thread/unthread left pants leg, Pull pants up/down Non-skid slipper socks- Performed by patient: Don/doff right sock, Don/doff left sock Shoes - Performed by patient: Don/doff right shoe, Don/doff left shoe Shoes - Performed by helper: Fasten right, Fasten left Assist for footwear: Setup Assist for lower body dressing: Supervision or verbal cues  Function - Toileting Toileting steps completed by patient: Adjust clothing prior to toileting, Performs perineal hygiene, Adjust clothing after toileting Toileting steps completed by helper: Adjust clothing prior to toileting Toileting Assistive Devices: Grab bar or rail Assist level: More than reasonable time  Function - Toilet Transfers Toilet transfer assistive device: Grab bar Assist level to toilet: Set up only Assist level from toilet: Set up only  Function - Chair/bed transfer Chair/bed transfer method: Ambulatory Chair/bed transfer assist level: No help, no cues Chair/bed transfer assistive device: Armrests Chair/bed transfer details: Verbal cues for precautions/safety  Function - Locomotion: Wheelchair Will patient use  wheelchair at discharge?: No Wheelchair activity did not occur: N/A (pt ambulatory on unit) Function - Locomotion: Ambulation Assistive device: No device Max distance: 200 ft Assist  level: No help, No cues, assistive device, takes more than a reasonable amount of time Assist level: No help, No cues, assistive device, takes more than a reasonable amount of time Assist level: No help, No cues, assistive device, takes more than a reasonable amount of time Assist level: No help, No cues, assistive device, takes more than a reasonable amount of time Assist level: Supervision or verbal cues  Function - Comprehension Comprehension: Auditory Comprehension assist level: Follows basic conversation/direction with no assist  Function - Expression Expression: Verbal Expression assistive device:  (slurred speech, expressive aphasia) Expression assist level: Expresses basic needs/ideas: With no assist  Function - Social Interaction Social Interaction assist level: Interacts appropriately 75 - 89% of the time - Needs redirection for appropriate language or to initiate interaction.  Function - Problem Solving Problem solving assist level: Solves basic problems with no assist  Function - Memory Memory assist level: Recognizes or recalls 75 - 89% of the time/requires cueing 10 - 24% of the time Patient normally able to recall (first 3 days only): That he or she is in a hospital, Location of own room, Current season  Medical Problem List and Plan: 1.  Right hemiparesis and ?aphasia vs dysarthria secondary to left frontal infarct  Cont CIR PT, OT, SLP 2.  DVT Prophylaxis/Anticoagulation: Eliquis. Venous Doppler studies negative 3. Pain Management/lupus: Ultram and Robaxin as needed. 4. Hypertension. Lisinopril 20 mg daily.running low, will reduce to '10mg'$  Vitals:   07/31/16 1710 08/01/16 0550  BP: 95/72 91/61  Pulse: 76 62  Resp:  16  Temp:  97.7 F (36.5 C)   5. Neuropsych: This patient is  capable of making decisions on her own behalf. 6. Skin/Wound Care: Routine skin checks  7. Fluids/Electrolytes/Nutrition: Routine I&Os 8. Hyperlipidemia. Lipitor 9. Tobacco/cocaine abuse. Nicoderm patch. Provide counseling 10. AKI:   Encourage fluid intake BMP Latest Ref Rng & Units 07/26/2016 07/24/2016 07/23/2016  Glucose 65 - 99 mg/dL 100(H) 108(H) 113(H)  BUN 6 - 20 mg/dL '18 10 11  '$ Creatinine 0.44 - 1.00 mg/dL 1.11(H) 1.01(H) 0.87  Sodium 135 - 145 mmol/L 139 138 138  Potassium 3.5 - 5.1 mmol/L 3.9 3.7 4.0  Chloride 101 - 111 mmol/L 106 101 105  CO2 22 - 32 mmol/L '27 31 26  '$ Calcium 8.9 - 10.3 mg/dL 9.0 9.0 9.3   11.  Afib with cocaine use probable causative factors  12.  Macrocytic anemia  Hb 11.3 on 12/12  Cont to monitor 13.  Spasticity R finger and wrist flexors  Hand splint with improvement, needs encouragement to wear it 14.  Small nose bleed add Ocean spray,  LOS (Days) 7 A FACE TO FACE EVALUATION WAS PERFORMED  Sharol Croghan E 08/01/2016, 7:00 AM

## 2016-08-01 NOTE — Progress Notes (Signed)
Social Work Patient ID: Andrea Kennedy, female   DOB: 08-04-1946, 70 y.o.   MRN: [id number]  Met with pt to discus PCP she does not have one and has no preference will obtain one for pt. Contacted [facility] and their NP's are taking new patients have an appointment for pt Jan 10 with Diona Browner. Informed pt and she was pleased with. MD office has pt';s daughter's information due to Pt;s speech difficulties. Work toward discharge on Wed.

## 2016-08-01 NOTE — Plan of Care (Signed)
Problem: RH Tub/Shower Transfers Goal: LTG Patient will perform tub/shower transfers w/assist (OT) LTG: Patient will perform tub/shower transfers with assist, with/without cues using equipment (OT)  Downgraded due to tub/shower transfer completed with step over method

## 2016-08-01 NOTE — Progress Notes (Signed)
Physical Therapy Session Note  Patient Details  Name: Andrea Kennedy MRN: [id number] Date of Birth: 03/10/1946  Today's Date: 08/01/2016 PT Individual Time: 1300-1346 PT Individual Time Calculation (min): 46 min    Short Term Goals: Week 1:  PT Short Term Goal 1 (Week 1): =LTGs due to ELOS  Skilled Therapeutic Interventions/Progress Updates:    Pt resting in bed on arrival, awakes to voice and agreeable to therapy gym.  Pt requesting to toilet prior to leaving room and does so mod I.  Gait throughout unit indep, up to 300'.  Session focus on NMR for RUE and balance.  Pt performed 2 trials of 2 minutes, mode A on dynavision using RUE only first trial 21 hits and second trial 13 hits 2/2 fatigue.  Nustep with BLEs at level 6 for 5 minutes for strengthening and endurance.  NMR marching on trampoline with UE support progress to no UE support for dynamic task on compliant surface.  Ball toss on trampoline for anticipatory postural control on compliant surface.  PT administered DGI with results below.  Pt returned to room at end of session and provided with mod I sign for room.  Left with call bell in reach and needs met.   Therapy Documentation Precautions:  Precautions Precautions: Fall Precaution Comments: hemiparesis RUE>RLE, mild unsteadiness with gait Restrictions Weight Bearing Restrictions: No  Balance: Standardized Balance Assessment Standardized Balance Assessment: Dynamic Gait Index Dynamic Gait Index Level Surface: Normal Change in Gait Speed: Normal Gait with Horizontal Head Turns: Mild Impairment Gait with Vertical Head Turns: Mild Impairment Gait and Pivot Turn: Normal Step Over Obstacle: Normal Step Around Obstacles: Normal Steps: Normal Total Score: 22   See Function Navigator for Current Functional Status.   Therapy/Group: Individual Therapy  Earnest Conroy Penven-Crew 08/01/2016, 3:36 PM

## 2016-08-01 NOTE — Progress Notes (Signed)
Speech Language Pathology Daily Session Note  Patient Details  Name: Andrea Kennedy MRN: [id number] Date of Birth: 22-Jan-1946  Today's Date: 08/01/2016 SLP Individual Time: 5670-1410 SLP Individual Time Calculation (min): 45 min   Short Term Goals: Week 1: SLP Short Term Goal 1 (Week 1): short term goals = long term goals due to length of stay   Skilled Therapeutic Interventions:  Pt was seen for skilled ST targeting goals for communication. SLP facilitated the session with a verbal description task to address carryover of intelligibility strategies.  Pt needed min assist verbal cues for use of slow rate and overarticulation at the sentence-conversational level to achieve intelligibility in a moderately noisy environment.  Pt needed extra time for higher level word finding during task.  Pt was returned to room and left in bathroom while seated on commode.  Nurse tech made aware who reports that pt calls for appropriately for assistance.  Continue per current plan of care.    Function:  Eating Eating                 Cognition Comprehension Comprehension assist level: Follows complex conversation/direction with extra time/assistive device  Expression   Expression assist level: Expresses basic 75 - 89% of the time/requires cueing 10 - 24% of the time. Needs helper to occlude trach/needs to repeat words.  Social Interaction Social Interaction assist level: Interacts appropriately 90% of the time - Needs monitoring or encouragement for participation or interaction.  Problem Solving Problem solving assist level: Solves basic problems with no assist  Memory Memory assist level: Recognizes or recalls 90% of the time/requires cueing < 10% of the time    Pain Pain Assessment Pain Assessment: No/denies pain  Therapy/Group: Individual Therapy  Andrea Kennedy, Selinda Orion 08/01/2016, 12:54 PM

## 2016-08-01 NOTE — Progress Notes (Signed)
Physical Therapy Session Note  Patient Details  Name: Andrea Kennedy MRN: [id number] Date of Birth: 1946-05-31  Today's Date: 08/01/2016 PT Individual Time: 0900-1001 PT Individual Time Calculation (min): 61 min    Skilled Therapeutic Interventions/Progress Updates:    Therapy initiated today with pt lying in bed.  Session today focused on increasing gait speed with significant time spent on ambulation with short fast bursts with no assistive device and supervision only.  Additionally worked on ambulation with gazing right/left.  Additionally worked on improving static and dynamic balance by having pt performing squatting activities to pick up cones while standing on foam surface, stepping over cones in tandem with toe tap on cone and squats to pick up cones. Pt continues with difficulty with static balance on uneven surface and with ambulation with head turns.   Pt denied pain t/o session.  Pt was left in room in cardiac chair with needs met.     Therapy Documentation Precautions:  Precautions Precautions: Fall Precaution Comments: hemiparesis RUE>RLE, mild unsteadiness with gait Restrictions Weight Bearing Restrictions: No   See Function Navigator for Current Functional Status.   Therapy/Group: Individual Therapy  Andrea Kennedy Hilario Quarry 08/01/2016, 12:15 PM

## 2016-08-02 ENCOUNTER — Inpatient Hospital Stay (HOSPITAL_COMMUNITY): Payer: Medicare HMO | Admitting: Occupational Therapy

## 2016-08-02 ENCOUNTER — Inpatient Hospital Stay (HOSPITAL_COMMUNITY): Payer: Medicare HMO | Admitting: Physical Therapy

## 2016-08-02 ENCOUNTER — Inpatient Hospital Stay (HOSPITAL_COMMUNITY): Payer: Medicare HMO | Admitting: Speech Pathology

## 2016-08-02 LAB — CBC
HCT: 34.5 % — ABNORMAL LOW (ref 36.0–46.0)
Hemoglobin: 11.3 g/dL — ABNORMAL LOW (ref 12.0–15.0)
MCH: 32.3 pg (ref 26.0–34.0)
MCHC: 32.8 g/dL (ref 30.0–36.0)
MCV: 98.6 fL (ref 78.0–100.0)
PLATELETS: 259 10*3/uL (ref 150–400)
RBC: 3.5 MIL/uL — AB (ref 3.87–5.11)
RDW: 13.8 % (ref 11.5–15.5)
WBC: 4.7 10*3/uL (ref 4.0–10.5)

## 2016-08-02 LAB — BASIC METABOLIC PANEL
ANION GAP: 7 (ref 5–15)
BUN: 32 mg/dL — ABNORMAL HIGH (ref 6–20)
CO2: 26 mmol/L (ref 22–32)
Calcium: 8.9 mg/dL (ref 8.9–10.3)
Chloride: 103 mmol/L (ref 101–111)
Creatinine, Ser: 1.21 mg/dL — ABNORMAL HIGH (ref 0.44–1.00)
GFR, EST AFRICAN AMERICAN: 52 mL/min — AB (ref >60)
GFR, EST NON AFRICAN AMERICAN: 45 mL/min — AB (ref >60)
Glucose, Bld: 105 mg/dL — ABNORMAL HIGH (ref 65–99)
POTASSIUM: 4.2 mmol/L (ref 3.5–5.1)
SODIUM: 136 mmol/L (ref 135–145)

## 2016-08-02 LAB — OCCULT BLOOD X 1 CARD TO LAB, STOOL: FECAL OCCULT BLD: POSITIVE — AB

## 2016-08-02 MED ORDER — SODIUM CHLORIDE 0.9 % IV BOLUS (SEPSIS)
250.0000 mL | Freq: Once | INTRAVENOUS | Status: AC
  Administered 2016-08-02: 250 mL via INTRAVENOUS

## 2016-08-02 NOTE — Plan of Care (Signed)
Problem: RH Expression Communication Goal: LTG Patient will increase speech intelligibility (SLP) LTG: Patient will increase speech intelligibility at word/phrase/conversation level with cues, % of the time (SLP)  Outcome: Not Met (add Reason) Pt needs supervision verbal cues to achieve intelligibility in phrases and sentences

## 2016-08-02 NOTE — Discharge Summary (Signed)
Discharge summary job [phone]

## 2016-08-02 NOTE — Progress Notes (Signed)
Social Work  Discharge Note  The overall goal for the admission was met for:   Discharge location: Yes-HOME WITH HUSBAND AND MOTHER [place] BUT IF TOO STRESSFUL WILL GO TO DAUGHTER'S  Length of Stay: Yes-9 DAYS  Discharge activity level: Yes-MOD/I LEVEL  Home/community participation: Yes  Services provided included: MD, RD, PT, OT, SLP, RN, CM, TR, Pharmacy, Neuropsych and SW  Financial Services: Private Insurance: [place]  Follow-up services arranged: Outpatient: [facility]-PT, OT,SP 12/28-9:00-11:00 PT & OT, SP-1/2 8:45-9:30 AM  Comments (or additional information):PT DID VERY WELL AND REACHED HER GOALS QUICKLY, PLAN TO GO HOME BUT IF TOO STRESSFUL WILL GO TO DAUGHTER'S HOME. AWARE CAN NOT GO BACK TO HER SUBSTANCE ABUSE FOR HER COPING. SHE PLANS TO QUICK AND DOES NOT FEEL SHE NEEDS ANY RESOURCES FOR THIS TO ASSIST HER.  Patient/Family verbalized understanding of follow-up arrangements: Yes  Individual responsible for coordination of the follow-up plan: SELF & DIANA-DAUGHTER  Confirmed correct DME delivered: Elease Hashimoto 08/02/2016    Elease Hashimoto

## 2016-08-02 NOTE — Progress Notes (Signed)
Occupational Therapy Discharge Summary  Patient Details  Name: Andrea Kennedy MRN: [id number] Date of Birth: 1946-03-16  Patient has met 12 of 12 long term goals due to improved activity tolerance, improved balance, ability to compensate for deficits and improved awareness.  Patient to discharge at overall Modified Independent level overall, Supervision with tub/shower transfers.  Patient's care partner is independent to provide the necessary intermittent supervision assistance at discharge.  Pt is able to direct her care and verbalizes understanding for recommendation with tub/shower transfers.  Reasons goals not met: NA  Recommendation:  Patient will benefit from ongoing skilled OT services in outpatient setting to continue to advance functional skills in the area of BADL and Reduce care partner burden.  Equipment: No equipment provided  Reasons for discharge: treatment goals met and discharge from hospital  Patient/family agrees with progress made and goals achieved: Yes  OT Discharge Vital Signs Therapy Vitals Temp: 97.9 F (36.6 C) Temp Source: Oral Pulse Rate: (!) 56 Resp: 18 BP: (!) 103/51 Patient Position (if appropriate): Lying Oxygen Therapy SpO2: 99 % O2 Device: Not Delivered Pain Pain Assessment Pain Assessment: 0-10 Pain Score: 2  Pain Type: Acute pain Pain Location: Head Pain Descriptors / Indicators: Headache ADL  See Function Navigator Vision/Perception  Vision- History Baseline Vision/History:  (Used to wear bifocals but does not anymore) Patient Visual Report: No change from baseline Vision- Assessment Vision Assessment?: Yes Eye Alignment: Within Functional Limits Ocular Range of Motion: Within Functional Limits Alignment/Gaze Preference: Within Defined Limits Tracking/Visual Pursuits: Able to track stimulus in all quads without difficulty Saccades: Decreased speed of saccadic movement (impaired scanning back to Rt) Visual Fields: Right visual  field deficit Depth Perception: Overshoots  Cognition Overall Cognitive Status: Within Functional Limits for tasks assessed Arousal/Alertness: Awake/alert Orientation Level: Oriented X4 Attention: Selective;Alternating Selective Attention: Appears intact Alternating Attention: Appears intact Awareness: Appears intact Problem Solving: Appears intact Behaviors: Lability Safety/Judgment: Appears intact Sensation Sensation Light Touch: Appears Intact Coordination Gross Motor Movements are Fluid and Coordinated: No Fine Motor Movements are Fluid and Coordinated: No Finger Nose Finger Test: WNL on L, impaired on R but able to bring hand to nose and to therapist's hand, overshooting Extremity/Trunk Assessment RUE Assessment RUE Assessment: Exceptions to [facility] RUE AROM (degrees) Overall AROM Right Upper Extremity: Within functional limits for tasks performed RUE Overall AROM Comments: Pt demonstrated ROM WFL, however with decreased ability to incorporate into functional tasks RUE Strength RUE Overall Strength: Deficits RUE Overall Strength Comments: strength grossly 4+/5 at shoulder and elbow, beginning to develop loose gross grasp with digits 1-2 LUE Assessment LUE Assessment: Within Functional Limits   See Function Navigator for Current Functional Status.  Simonne Come 08/02/2016, 8:31 AM

## 2016-08-02 NOTE — Progress Notes (Signed)
Occupational Therapy Session Note  Patient Details  Name: Andrea Kennedy MRN: [id number] Date of Birth: 11-29-1945  Today's Date: 08/02/2016 OT Individual Time: 6333-5456 and 2563-8937 OT Individual Time Calculation (min): 60 min and 30 min    Short Term Goals:Week 1:  OT Short Term Goal 1 (Week 1): STG = LTGs due to short ELOS  Skilled Therapeutic Interventions/Progress Updates:    1) Completed ADL retraining at overall Mod I level.  Pt completed bathing at standing level at sink as pt had showered yesterday.  Gathered all items without assistance and completed dressing at sit > stand level.  Pt tearful at beginning of session as she reports "being scared" to d/c home.  Reports strained relationship with her mother in law, discussed alternative d/c plans with pt reporting she will still d/c home but she and husband plan to re-evaluate plan in about 2 months and assess if they need to go somewhere else.  Pt continues to utilize RUE as gross assist throughout self-care tasks. Completed tub/shower transfers in ADL apt bathroom with pt stepping over tub ledge with supervision.  Discussed recommendation for supervision with shower transfers at home with pt reporting husband will be able to provide supervision as recommended.  2) Treatment session with focus on RUE NMR.  Provided pt with fine motor control handout with focus on finger flexion/extension as well as gross grasp activities.  Encouraged pt to continue to attempt exercises even as they proved to be challenging throughout session, discussing neuroplasticity and recovery process.  Engaged in [place] with focus on more gross motor activity while standing on foam surface to further challenge balance.  Pt reports no further questions and ready for d/c home.  Therapy Documentation Precautions:  Precautions Precautions: Fall Precaution Comments: hemiparesis RUE>RLE, mild unsteadiness with gait Restrictions Weight Bearing Restrictions:  No General:   Vital Signs: Therapy Vitals Temp: 97.9 F (36.6 C) Temp Source: Oral Pulse Rate: (!) 56 Resp: 18 BP: (!) 103/51 Patient Position (if appropriate): Lying Oxygen Therapy SpO2: 99 % O2 Device: Not Delivered Pain:   ADL:   Exercises:   Other Treatments:    See Function Navigator for Current Functional Status.   Therapy/Group: Individual Therapy  Simonne Come 08/02/2016, 8:07 AM

## 2016-08-02 NOTE — Progress Notes (Signed)
Subjective/Complaints: Very sad about her relationship with mother in law Reviewed flow  Sheet , good appetitie but fluid intake is low  ROS: Denies CP, SOB, N/V/D.  Objective: Vital Signs: Blood pressure (!) 103/51, pulse (!) 56, temperature 97.9 F (36.6 C), temperature source Oral, resp. rate 18, height '5\' 8"'  (1.727 m), weight 59.7 kg (131 lb 9.8 oz), SpO2 99 %. No results found. Results for orders placed or performed during the hospital encounter of 07/25/16 (from the past 72 hour(s))  Occult blood card to lab, stool RN will collect     Status: Abnormal   Collection Time: 08/01/16  8:42 AM  Result Value Ref Range   Fecal Occult Bld POSITIVE (A) NEGATIVE  CBC     Status: Abnormal   Collection Time: 08/02/16  5:44 AM  Result Value Ref Range   WBC 4.7 4.0 - 10.5 K/uL   RBC 3.50 (L) 3.87 - 5.11 MIL/uL   Hemoglobin 11.3 (L) 12.0 - 15.0 g/dL   HCT 34.5 (L) 36.0 - 46.0 %   MCV 98.6 78.0 - 100.0 fL   MCH 32.3 26.0 - 34.0 pg   MCHC 32.8 30.0 - 36.0 g/dL   RDW 13.8 11.5 - 15.5 %   Platelets 259 150 - 400 K/uL  Basic metabolic panel     Status: Abnormal   Collection Time: 08/02/16  5:44 AM  Result Value Ref Range   Sodium 136 135 - 145 mmol/L   Potassium 4.2 3.5 - 5.1 mmol/L   Chloride 103 101 - 111 mmol/L   CO2 26 22 - 32 mmol/L   Glucose, Bld 105 (H) 65 - 99 mg/dL   BUN 32 (H) 6 - 20 mg/dL   Creatinine, Ser 1.21 (H) 0.44 - 1.00 mg/dL   Calcium 8.9 8.9 - 10.3 mg/dL   GFR calc non Af Amer 45 (L) >60 mL/min   GFR calc Af Amer 52 (L) >60 mL/min    Comment: (NOTE) The eGFR has been calculated using the CKD EPI equation. This calculation has not been validated in all clinical situations. eGFR's persistently <60 mL/min signify possible Chronic Kidney Disease.    Anion gap 7 5 - 15    Constitutional: She appears well-developed. NAD. HENT: Normocephalic and atraumatic.  Eyes: EOM are normal. No discharge.  Cardiovascular: Irregular irregular  Respiratory: Effort normal and  breath sounds normal.  GI: Soft. Bowel sounds are normal. She exhibits no distension. There is no tenderness.  Musculoskeletal: She exhibits no edema or tenderness.  Neurological:  She is alert and oriented.  Dysarthria with apraxia vs. Expressive Aphasia Motor: RUE: shoulder abduction, elbow flexion/extension 3+/5, wrist/hand 2/5 LUE: 5/5 proximal to distal RLE: 4+/5 proximal to distal  LLE:5/5 proximal to distal Sensation intact to light touch Skin: Skin is warm and dry.  Psychiatric: She has a normal mood and affect. Her behavior is normal.    Assessment/Plan: 1. 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 Physiatrist is providing close team supervision and 24 hour management of active medical problems listed below. Physiatrist and rehab team continue to assess barriers to discharge/monitor patient progress toward functional and medical goals. FIM: Function - Bathing Position: Shower Body parts bathed by patient: Right arm, Chest, Abdomen, Front perineal area, Buttocks, Right upper leg, Left upper leg, Left arm, Right lower leg, Left lower leg, Back Bathing not applicable: Left arm, Right  lower leg, Left lower leg, Back Assist Level: More than reasonable time  Function- Upper Body Dressing/Undressing What is the patient wearing?: Bra, Pull over shirt/dress Bra - Perfomed by patient: Thread/unthread right bra strap, Thread/unthread left bra strap, Hook/unhook bra (pull down sports bra) Bra - Perfomed by helper: Hook/unhook bra (pull down sports bra) Pull over shirt/dress - Perfomed by patient: Thread/unthread right sleeve, Thread/unthread left sleeve, Put head through opening, Pull shirt over trunk Assist Level: Supervision or verbal cues, Set up Function - Lower Body Dressing/Undressing What is the patient wearing?: Underwear,  Pants, Shoes Position: Sitting EOB Underwear - Performed by patient: Thread/unthread right underwear leg, Thread/unthread left underwear leg, Pull underwear up/down Pants- Performed by patient: Thread/unthread right pants leg, Thread/unthread left pants leg, Pull pants up/down Non-skid slipper socks- Performed by patient: Don/doff right sock, Don/doff left sock Shoes - Performed by patient: Don/doff right shoe, Don/doff left shoe Shoes - Performed by helper: Fasten right, Fasten left Assist for footwear: Setup Assist for lower body dressing: More than reasonable time  Function - Toileting Toileting steps completed by patient: Adjust clothing prior to toileting, Performs perineal hygiene, Adjust clothing after toileting Toileting steps completed by helper: Adjust clothing prior to toileting Toileting Assistive Devices: Grab bar or rail Assist level: More than reasonable time  Function - Air cabin crew transfer assistive device: Grab bar Assist level to toilet: No Help, no cues, assistive device, takes more than a reasonable amount of time Assist level from toilet: No Help, no cues, assistive device, takes more than a reasonable amount of time  Function - Chair/bed transfer Chair/bed transfer method: Ambulatory Chair/bed transfer assist level: No Help, no cues, assistive device, takes more than a reasonable amount of time Chair/bed transfer assistive device: Armrests Chair/bed transfer details: Verbal cues for precautions/safety  Function - Locomotion: Wheelchair Will patient use wheelchair at discharge?: No Wheelchair activity did not occur: N/A (pt ambulatory on unit) Function - Locomotion: Ambulation Assistive device: No device Max distance: 300 ft Assist level: No Help, No cues Assist level: No Help, No cues Assist level: No Help, No cues Assist level: No Help, No cues Assist level: No help, No cues, assistive device, takes more than a reasonable amount of  time  Function - Comprehension Comprehension: Auditory Comprehension assist level: Follows complex conversation/direction with extra time/assistive device  Function - Expression Expression: Verbal Expression assistive device:  (slurred speech, expressive aphasia) Expression assist level: Expresses basic 75 - 89% of the time/requires cueing 10 - 24% of the time. Needs helper to occlude trach/needs to repeat words.  Function - Social Interaction Social Interaction assist level: Interacts appropriately 90% of the time - Needs monitoring or encouragement for participation or interaction.  Function - Problem Solving Problem solving assist level: Solves basic problems with no assist  Function - Memory Memory assist level: Recognizes or recalls 90% of the time/requires cueing < 10% of the time Patient normally able to recall (first 3 days only): That he or she is in a hospital, Location of own room, Current season  Medical Problem List and Plan: 1.  Right hemiparesis and ?aphasia vs dysarthria secondary to left frontal infarct  Cont CIR PT, OT, SLP, plan d/c in am 2.  DVT Prophylaxis/Anticoagulation: Eliquis. Venous Doppler studies negative 3. Pain Management/lupus: Ultram and Robaxin as needed. 4. Hypertension. Lisinopril 20 mg daily.running low, will d/c, may be contributing to azotemia Vitals:   08/01/16 1739 08/02/16 0525  BP: (!) 89/60 (!) 103/51  Pulse: 61 (!) 56  Resp:  18  Temp:  97.9 F (36.6 C)   5. Neuropsych: This patient is capable of making decisions on her own behalf. 6. Skin/Wound Care: Routine skin checks  7. Fluids/Electrolytes/Nutrition: Routine I&Os 8. Hyperlipidemia. Lipitor 9. Tobacco/cocaine abuse. Nicoderm patch. Provide counseling 10. AKI:   Encourage fluid intake BMP Latest Ref Rng & Units 08/02/2016 07/26/2016 07/24/2016  Glucose 65 - 99 mg/dL 105(H) 100(H) 108(H)  BUN 6 - 20 mg/dL 32(H) 18 10  Creatinine 0.44 - 1.00 mg/dL 1.21(H) 1.11(H) 1.01(H)   Sodium 135 - 145 mmol/L 136 139 138  Potassium 3.5 - 5.1 mmol/L 4.2 3.9 3.7  Chloride 101 - 111 mmol/L 103 106 101  CO2 22 - 32 mmol/L '26 27 31  ' Calcium 8.9 - 10.3 mg/dL 8.9 9.0 9.0   11.  Afib with cocaine use probable causative factors  12.  Macrocytic anemia  Hb 11.3 on 12/12, repeat in am, does have OB + stool f/u PCP and arrange for colonoscopy as outpt  Cont to monitor 13.  Spasticity R finger and wrist flexors  Hand splint with improvement, needs encouragement to wear it 14.  Small nose bleed add [place] spray, 15.  Pre renal Azotemia, fluid bolus today, repeat BMET in am LOS (Days) 8 A FACE TO FACE EVALUATION WAS PERFORMED  KIRSTEINS,ANDREW E 08/02/2016, 7:33 AM

## 2016-08-02 NOTE — Progress Notes (Signed)
Physical Therapy Discharge Summary  Patient Details  Name: Andrea Kennedy MRN: [id number] Date of Birth: [birth year]  Today's Date: 08/02/2016 PT Individual Time: 1004-1100 PT Individual Time Calculation (min): 56 min     Patient has met 10 of 10 long term goals due to improved activity tolerance, improved balance, improved postural control, increased strength, increased range of motion, ability to compensate for deficits, functional use of  right upper extremity and right lower extremity, improved attention, improved awareness and improved coordination.  Patient to discharge at an ambulatory level Modified Independent.     Recommendation:  Patient will benefit from ongoing skilled PT services in outpatient setting to continue to advance safe functional mobility, address ongoing impairments in balance, coordination, and functional use of RUE, and minimize fall risk.  Equipment: No equipment provided  Reasons for discharge: treatment goals met  Patient/family agrees with progress made and goals achieved: Yes   Skilled Therapeutic Intervention: Pt resting in w/c on arrival, no c/o pain, and agreeable to therapy session.  Session focus on reassessment of strength, coordination, sensation, perception, and mobility, falls recovery, and NMR for balance, postural control, and RUE.  See below for reassessment details.  Pt notes increase in lability with sadness and laughter over the last several days and PT provided emotional support and education on lability and CVA.   Pt performs floor transfer mod I and PT provided education for falls recovery including how to assess for and call for help.  Pt reports some hesitation with christmas shopping and PT problem solved ways to manage crowded places with pt.    NMR for balance, force, direction, and speed kicking a ball while ambulating focus on keeping ball close to pt.  NMR in sitting focus on functional use of RUE to tap ball.  NMR on UE bike x2.5  minutes with RUE only and PT facilitating improved positioning to maximize independence with grip, +1.5 minutes with BUE and pt focus on maintaining RUE grip without assist.    Pt returned to room at end of session with needs met.   PT Discharge Precautions/RestrictionsPrecautions Precautions: Fall Precaution Comments: RUE hemiparesis>RLE hemiparesis Restrictions Weight Bearing Restrictions: No Vital Signs  Pain Pain Assessment Pain Assessment: No/denies pain Pain Score: 2  Pain Type: Acute pain Pain Location: Head Pain Descriptors / Indicators: Headache Vision/Perception  Vision - Assessment Eye Alignment: Within Functional Limits Ocular Range of Motion: Within Functional Limits Alignment/Gaze Preference: Within Defined Limits Tracking/Visual Pursuits: Able to track stimulus in all quads without difficulty Saccades: Decreased speed of saccadic movement (impaired scanning back to Rt)  Cognition Overall Cognitive Status: Within Functional Limits for tasks assessed Arousal/Alertness: Awake/alert Orientation Level: Oriented X4 Attention: Divided Sustained Attention: Appears intact Selective Attention: Appears intact Alternating Attention: Appears intact Divided Attention: Appears intact Awareness: Appears intact Problem Solving: Appears intact Behaviors: Lability Safety/Judgment: Appears intact Sensation Sensation Light Touch: Appears Intact (report occasional N/T in R fingertips as if they are asleep) Proprioception: Appears Intact Coordination Gross Motor Movements are Fluid and Coordinated: Yes (LEs) Fine Motor Movements are Fluid and Coordinated: Yes (LEs) Finger Nose Finger Test: WNL on L, impaired on R but able to bring hand to nose and to therapist's hand, overshooting Heel Shin Test: [facility] Motor  Motor Motor: Other (comment) Motor - Discharge Observations: R hemiparesis, UE>LE  Mobility Bed Mobility Bed Mobility: Sit to Supine;Supine to Sit Supine to Sit: 7:  Independent Sit to Supine: 7: Independent Transfers Transfers: Yes Sit to Stand: 7: Independent Stand to Sit: 7:  Independent Locomotion  Ambulation Ambulation: Yes Ambulation/Gait Assistance: 6: Modified independent (Device/Increase time) Ambulation Distance (Feet): 300 Feet Assistive device: None Gait Gait: Yes Gait Pattern: Narrow base of support;Decreased weight shift to right;Decreased stance time - right High Level Ambulation High Level Ambulation: Direction changes Direction Changes: mod I Stairs / Additional Locomotion Stairs: Yes Stairs Assistance: 6: Modified independent (Device/Increase time) Stair Management Technique: No rails;Forwards;Alternating pattern;Step to pattern Number of Stairs: 12 Height of Stairs: 6 Ramp: 6: Modified independent (Device) Curb: 6: Modified independent (Device/increase time) Wheelchair Mobility Wheelchair Mobility: No  Trunk/Postural Assessment  Cervical Assessment Cervical Assessment: Within Functional Limits Thoracic Assessment Thoracic Assessment: Within Functional Limits Lumbar Assessment Lumbar Assessment: Within Functional Limits Postural Control Postural Control: Within Functional Limits  Balance Balance Balance Assessed: Yes Dynamic Sitting Balance Dynamic Sitting - Balance Support: No upper extremity supported;Feet supported;During functional activity Dynamic Sitting - Level of Assistance: 7: Independent Static Standing Balance Static Standing - Balance Support: No upper extremity supported;During functional activity Static Standing - Level of Assistance: 6: Modified independent (Device/Increase time) Dynamic Standing Balance Dynamic Standing - Balance Support: No upper extremity supported;During functional activity Dynamic Standing - Level of Assistance: 6: Modified independent (Device/Increase time) Extremity Assessment  RUE Assessment RUE Assessment: Exceptions to [facility] RUE AROM (degrees) Overall AROM Right Upper  Extremity: Within functional limits for tasks performed RUE Overall AROM Comments: Pt demonstrated ROM WFL, however with decreased ability to incorporate into functional tasks RUE Strength RUE Overall Strength: Deficits RUE Overall Strength Comments: strength grossly 4+/5 at shoulder and elbow, beginning to develop loose gross grasp with digits 1-2 LUE Assessment LUE Assessment: Within Functional Limits RLE Assessment RLE Assessment: Within Functional Limits (5/5 throughout) LLE Assessment LLE Assessment: Within Functional Limits (5/5 throughout)   See Function Navigator for Current Functional Status.  Edmon Magid E Penven-Crew 08/02/2016, 10:27 AM

## 2016-08-02 NOTE — Progress Notes (Signed)
Speech Language Pathology Discharge Summary  Patient Details  Name: Andrea Kennedy MRN: [id number] Date of Birth: [birth year]  Today's Date: 08/02/2016 SLP Individual Time: 1120-1200 SLP Individual Time Calculation (min): 40 min    Skilled Therapeutic Interventions:  Pt was seen for skilled ST targeting speech and swallowing goals.  SLP facilitated the session with a trial meal tray of regular textures.  Pt consumed dys 3 and regular solids during lunch meal with mod I use of swallowing precautions.  Pt was able to clear solids from the oral cavity post swallow with mod I use of swallowing precautions. No overt s/s of aspiration were evident with solids or liquids.  Pt was able to verbalize safe swallowing strategies to utilize when she is consuming advanced textures with mod I.  As a result, recommend that pt's diet be upgraded to regular textures and thin liquids.  Pt needed overall supervision verbal cues for slow rate and overarticulation to achieve intelligibility in conversations.  Pt was left in bed at the end of today's therapy session with call bell left within reach.  Pt is ready for discharge tomorrow.       Patient has met 4 of 5 long term goals.  Patient to discharge at overall Supervision level.   Clinical Impression/Discharge Summary:  Pt has made functional gains while inpatient and is discharging having met 4 out of 5 long term goals.  Pt is overall supervision for use of compensatory strategies to achieve intelligibility at the phrase/sentence level due to moderate dysarthria resulting from oral motor weakness.  Pt's diet has been upgraded to regular textures and thin liquids which pt is consuming with mod I use of swallowing precautions.  Pt is discharging home with assistance from family.  She would benefit from skilled ST at next level of care to continue to address speech intelligibility.  Pt education is complete at this time.    Care Partner:  Caregiver Able to Provide  Assistance: Yes  Type of Caregiver Assistance: Physical;Cognitive  Recommendation:  Outpatient SLP  Rationale for SLP Follow Up: Maximize functional communication;Reduce caregiver burden   Equipment: none recommended by SLP    Reasons for discharge: Discharged from hospital   Patient/Family Agrees with Progress Made and Goals Achieved: Yes   Function:  Eating Eating   Modified Consistency Diet: No Eating Assist Level: Swallowing techniques: self managed;More than reasonable amount of time;Set up assist for   Eating Set Up Assist For: Opening containers       Cognition Comprehension Comprehension assist level: Follows complex conversation/direction with extra time/assistive device  Expression   Expression assist level: Expresses basic 90% of the time/requires cueing < 10% of the time.  Social Interaction Social Interaction assist level: Interacts appropriately with others with medication or extra time (anti-anxiety, antidepressant).  Problem Solving Problem solving assist level: Solves basic problems with no assist  Memory Memory assist level: More than reasonable amount of time   [name] 08/02/2016, 3:50 PM

## 2016-08-02 NOTE — Discharge Instructions (Signed)
Inpatient Rehab Discharge Instructions  Andrea Kennedy Discharge date and time: No discharge date for patient encounter.   Activities/Precautions/ Functional Status: Activity: activity as tolerated Diet: soft Wound Care: none needed Functional status:  ___ No restrictions     ___ Walk up steps independently ___ 24/7 supervision/assistance   ___ Walk up steps with assistance ___ Intermittent supervision/assistance  ___ Bathe/dress independently ___ Walk with walker     _x__ Bathe/dress with assistance ___ Walk Independently    ___ Shower independently ___ Walk with assistance    ___ Shower with assistance ___ No alcohol     ___ Return to work/school ________  Special Instructions: No driving smoking or illicit drug products    COMMUNITY REFERRALS UPON DISCHARGE:    Outpatient: PT, OT, SP  Agency:[facility] Phone:[phone]   Date of Last Service:08/03/2016  Appointment Date/Time:DECEMBER 28 Thursday 9:00-11:00 AM-PT & OT AND JANUARY 2 @ 8:45-9:30 AM FOR SP  Medical Equipment/Items Ordered:NO NEEDS    GENERAL COMMUNITY RESOURCES FOR PATIENT/FAMILY: Support Groups:[facility] SUPPORT GROUP EVERY SECOND Thursday @ 3:00-4:00 PM ON THE REHAB UNIT QUESTIONS CONTACT [facility] [phone] DECLINES SUBSTANCE ABUSE RESOURCES FEELS CAN DO THIS ON HER OWN  STROKE/TIA DISCHARGE INSTRUCTIONS SMOKING Cigarette smoking nearly doubles your risk of having a stroke & is the single most alterable risk factor  If you smoke or have smoked in the last 12 months, you are advised to quit smoking for your health.  Most of the excess cardiovascular risk related to smoking disappears within a year of stopping.  Ask you doctor about anti-smoking medications  [place] Quit Line: 1-800-QUIT NOW  Free Smoking Cessation Classes [phone]  CHOLESTEROL Know your levels; limit fat & cholesterol in your diet  Lipid Panel     Component Value Date/Time   CHOL 181 07/20/2016 0440   TRIG 85  07/20/2016 0440   HDL 62 07/20/2016 0440   CHOLHDL 2.9 07/20/2016 0440   VLDL 17 07/20/2016 0440   LDLCALC 102 (H) 07/20/2016 0440      Many patients benefit from treatment even if their cholesterol is at goal.  Goal: Total Cholesterol (CHOL) less than 160  Goal:  Triglycerides (TRIG) less than 150  Goal:  HDL greater than 40  Goal:  LDL (LDLCALC) less than 100   BLOOD PRESSURE American Stroke Association blood pressure target is less that 120/80 mm/Hg  Your discharge blood pressure is:  BP: 91/61  Monitor your blood pressure  Limit your salt and alcohol intake  Many individuals will require more than one medication for high blood pressure  DIABETES (A1c is a blood sugar average for last 3 months) Goal HGBA1c is under 7% (HBGA1c is blood sugar average for last 3 months)  Diabetes: No known diagnosis of diabetes    Lab Results  Component Value Date   HGBA1C 5.2 07/20/2016     Your HGBA1c can be lowered with medications, healthy diet, and exercise.  Check your blood sugar as directed by your physician  Call your physician if you experience unexplained or low blood sugars.  PHYSICAL ACTIVITY/REHABILITATION Goal is 30 minutes at least 4 days per week  Activity: Increase activity slowly, Therapies: Physical Therapy: Home Health Return to work:   Activity decreases your risk of heart attack and stroke and makes your heart stronger.  It helps control your weight and blood pressure; helps you relax and can improve your mood.  Participate in a regular exercise program.  Talk with your doctor about the best form  of exercise for you (dancing, walking, swimming, cycling).  DIET/WEIGHT Goal is to maintain a healthy weight  Your discharge diet is: DIET DYS 3 Room service appropriate? Yes; Fluid consistency: Thin  liquids Your height is:  Height: '5\' 8"'$  (172.7 cm) Your current weight is: Weight: 59.7 kg (131 lb 9.8 oz) Your Body Mass Index (BMI) is:  BMI (Calculated): 20.4   Following the type of diet specifically designed for you will help prevent another stroke.  Your goal weight range is:    Your goal Body Mass Index (BMI) is 19-24.  Healthy food habits can help reduce 3 risk factors for stroke:  High cholesterol, hypertension, and excess weight.  RESOURCES Stroke/Support Group:  Call [phone]   STROKE EDUCATION PROVIDED/REVIEWED AND GIVEN TO PATIENT Stroke warning signs and symptoms How to activate emergency medical system (call 911). Medications prescribed at discharge. Need for follow-up after discharge. Personal risk factors for stroke. Pneumonia vaccine given:  Flu vaccine given:  My questions have been answered, the writing is legible, and I understand these instructions.  I will adhere to these goals & educational materials that have been provided to me after my discharge from the hospital.      My questions have been answered and I understand these instructions. I will adhere to these goals and the provided educational materials after my discharge from the hospital.  Patient/Caregiver Signature _______________________________ Date __________  Clinician Signature _______________________________________ Date __________  Please bring this form and your medication list with you to all your follow-up doctor's appointments.

## 2016-08-02 NOTE — Discharge Summary (Signed)
NAMESABRIAH, HOBBINS NO.:  [id number]  MEDICAL RECORD NO.:  [id number]  LOCATION:  4M07C                        FACILITY:  [facility]  PHYSICIAN:  Charlett Blake, M.D.DATE OF BIRTH:  12-08-1945  DATE OF ADMISSION:  07/25/2016 DATE OF DISCHARGE:  08/03/2016                              DISCHARGE SUMMARY   DISCHARGE DIAGNOSES: 1. Left frontal infarction. 2. Eliquis for deep venous thrombosis prophylaxis. 3. Pain management. 4. Hypertension. 5. Hyperlipidemia. 6. Tobacco, cocaine abuse. 7. Atrial fibrillation induced secondary to cocaine. 8. Spasticity, right finger and wrist flexors. 9. Macrocytic anemia   HISTORY OF PRESENT ILLNESS:  This is a 70 year old right-handed female, history of lupus, tobacco abuse, right total hip replacement, June 03, 2015, was discharged to a skilled nursing facility.  Per chart review, she lives with spouse independent prior to admission, one-level home.  Presented on July 20, 2016, with right-sided weakness and aphasia.  Urine drug screen positive cocaine.  CT, MRI of the head showed acute, moderate nonhemorrhagic left frontal lobe infarct.  The patient did not receive tPA.  CTA of head and neck with no emergent large vessel occlusion or stenosis.  EEG showed focal cerebral dysfunction over the left hemisphere suggestive of underlying structural or physiologic abnormality, no seizure activity.  Venous Doppler was negative for DVT.  Echocardiogram with ejection fraction of 59%, systolic function normal.  Neurology consulted, maintained on Plavix and aspirin for CVA prophylaxis.  Subcutaneous Lovenox for DVT prophylaxis. Maintained on telemetry, showing rapid irregular heart rate that appeared consistent with atrial fibrillation.  Cardiology Service is consulted, advised Eliquis, and aspirin, Plavix, Lovenox discontinued at that time.  Tolerating a mechanical and soft diet.  Mildly elevated blood pressure.  She did  receive intravenous hydralazine.  Consideration for TEE, however, canceled by Neurology as did not feel any further workup needed as the patient was already maintained on Eliquis. Physical and occupational therapy ongoing.  The patient was admitted for comprehensive rehab program.  PAST MEDICAL HISTORY:  See discharge diagnoses.  SOCIAL HISTORY:  Lives with spouse, independent prior to admission.  FUNCTIONAL STATUS:  Upon admission to [facility] was min assist to ambulate 4 feet, one-person handheld assistance; minimal assist, stand pivot transfers; min-to-mod assist, activities of daily living.  PHYSICAL EXAMINATION:  VITAL SIGNS:  Blood pressure 125/70, pulse 49, temperature 97, respirations 17. GENERAL:  This was an alert female, well developed, alert, oriented to person, she was aphasic.  She could answer simple questions. CARDIAC:  Irregularly irregular, rate controlled. ABDOMEN:  Soft, nontender.  Good bowel sounds.  No distention. LUNGS:  Clear to auscultation without wheeze.  REHABILITATION HOSPITAL COURSE:  The patient was admitted to Inpatient Rehab Services with therapies initiated on a 3-hour daily basis consisting of physical therapy, occupational therapy, speech therapy and rehabilitation nursing.  The following issues were addressed during the patient's rehabilitation stay.  Pertaining to Ms. Staubs's left frontal lobe infarct remained stable, maintained on Eliquis.  She would follow up with Neurology Services.  Venous Doppler studies negative.  No signs of DVT.  Pain management with noted history of lupus.  She continued on Ultram as needed as well as Robaxin.  Blood pressure was  controlled, monitored.  She had been on lisinopril was discontinued due to some orthostasis and question contributing to some mild azotemia with latest creatinine 1.21.  Noted bouts of atrial fibrillation, felt to be possibly induced from cocaine. Followup per Cardiology Services,  currently maintained on Eliquis.  No chest pain or shortness of breath.  She would follow up outpatient with Cardiology Services.  Maintained on Lipitor for hyperlipidemia.  Noted history of tobacco, cocaine abuse.  She was on a NicoDerm patch.  She received full counsel in regard to cessation of nicotine, illicit drug products.  It was questionable if she would be compliant with these requests.  Noted acute kidney injury, creatinine 0.87 to 1.11 and monitored. Macrocytic anemia and OB positive stool 2 with hemoglobin and hematocrit remaining stable 11.3-11.5 question plan for outpatient colonoscopy and workup. No nausea or vomiting noted she remained on PPI.  The patient received weekly collaborative interdisciplinary team conferences to discuss estimated length of stay, family teaching, any barriers to discharge.  Ambulating 300 feet on the unit without assistive device.  She could navigate stairs with supervision. Supervision to negotiate obstacles.  Gather her belongings for activities of daily living and homemaking.  Overall modified independence for her ADLs.  She could communicate her needs.  Followup for Speech Therapy for aphasia.  She was given verbal description tasks to address carry-over of intelligibility strategies with minimal assistance.  It was advised the need for her supervision at home.  DISCHARGE MEDICATIONS: 1. Eliquis 5 mg p.o. b.i.d. 2. Lipitor 20 mg p.o. daily. 3. Colace 100 mg p.o. b.i.d. 6. Multivitamin daily. 7. NicoDerm patch, taper as directed. 8. Vitamin B12 1000 mcg p.o. daily. 9. Robaxin 500 mg p.o. every 6 hours as needed muscle spasms. 10.Ultram 100 mg p.o. every 6 hours as needed pain, dispense of 20     Tablets. 11. Protonix 40 mg by mouth daily  DIET:  Mechanical soft.  FOLLOWUP:  She would follow up with Dr. Alysia Penna at the [facility] as directed; Dr. Antony Contras, 1 month call for appointment; Dr. Candee Furbish, call for  appointment; Dr. Mauricio Po, medical management.  SPECIAL INSTRUCTIONS:  No smoking, alcohol or illicit drug products.  Follow-up CBC with PCP for OB positive stool with hemoglobin stable     Lauraine Rinne, P.A.   ______________________________ Charlett Blake, M.D.    DA/MEDQ  D:  08/02/2016  T:  08/02/2016  Job:  [id number]  cc:   Mauricio Po, MSN, APRN, FNP-C Jerline Pain, MD Pramod P. Leonie Man, MD

## 2016-08-03 LAB — BASIC METABOLIC PANEL
Anion gap: 8 (ref 5–15)
BUN: 22 mg/dL — ABNORMAL HIGH (ref 6–20)
CALCIUM: 9.4 mg/dL (ref 8.9–10.3)
CO2: 25 mmol/L (ref 22–32)
Chloride: 105 mmol/L (ref 101–111)
Creatinine, Ser: 1.06 mg/dL — ABNORMAL HIGH (ref 0.44–1.00)
GFR, EST NON AFRICAN AMERICAN: 53 mL/min — AB (ref >60)
Glucose, Bld: 100 mg/dL — ABNORMAL HIGH (ref 65–99)
POTASSIUM: 4.8 mmol/L (ref 3.5–5.1)
Sodium: 138 mmol/L (ref 135–145)

## 2016-08-03 LAB — CBC
HEMATOCRIT: 34.6 % — AB (ref 36.0–46.0)
Hemoglobin: 11.5 g/dL — ABNORMAL LOW (ref 12.0–15.0)
MCH: 33.1 pg (ref 26.0–34.0)
MCHC: 33.2 g/dL (ref 30.0–36.0)
MCV: 99.7 fL (ref 78.0–100.0)
Platelets: 278 10*3/uL (ref 150–400)
RBC: 3.47 MIL/uL — AB (ref 3.87–5.11)
RDW: 13.7 % (ref 11.5–15.5)
WBC: 7.8 10*3/uL (ref 4.0–10.5)

## 2016-08-03 MED ORDER — PANTOPRAZOLE SODIUM 40 MG PO TBEC: 40.0000 mg | DELAYED_RELEASE_TABLET | Freq: Every day | ORAL | 1 refills | Status: DC

## 2016-08-03 MED ORDER — APIXABAN 5 MG PO TABS: 5.0000 mg | ORAL_TABLET | Freq: Two times a day (BID) | ORAL | 0 refills | Status: DC

## 2016-08-03 MED ORDER — NICOTINE 14 MG/24HR TD PT24: MEDICATED_PATCH | TRANSDERMAL | 0 refills | Status: DC

## 2016-08-03 MED ORDER — ATORVASTATIN CALCIUM 20 MG PO TABS: 20.0000 mg | ORAL_TABLET | Freq: Every day | ORAL | 1 refills | Status: DC

## 2016-08-03 MED ORDER — METHOCARBAMOL 500 MG PO TABS
500.0000 mg | ORAL_TABLET | Freq: Four times a day (QID) | ORAL | 0 refills | Status: AC | PRN
Start: 2016-08-03 — End: ?

## 2016-08-03 MED ORDER — CYANOCOBALAMIN 1000 MCG PO TABS: 1000.0000 ug | ORAL_TABLET | Freq: Every day | ORAL | 0 refills | Status: DC

## 2016-08-03 MED ORDER — TRAMADOL HCL 50 MG PO TABS: 50.0000 mg | ORAL_TABLET | Freq: Four times a day (QID) | ORAL | 0 refills | Status: DC | PRN

## 2016-08-03 NOTE — Progress Notes (Signed)
Subjective/Complaints: Pt states she has had polyp removed via colonoscope ~16yr ago but no f/u diagnostic colonoscopy States she had severe constipation , hard stools with sm amt blood on tissue a few days ago  ROS: Denies CP, SOB, N/V/D.  Objective: Vital Signs: Blood pressure (!) 126/56, pulse (!) 57, temperature 98 F (36.7 C), temperature source Oral, resp. rate 18, height '5\' 8"'  (1.727 m), weight 60 kg (132 lb 4.4 oz), SpO2 100 %. No results found. Results for orders placed or performed during the hospital encounter of 07/25/16 (from the past 72 hour(s))  Occult blood card to lab, stool RN will collect     Status: Abnormal   Collection Time: 08/01/16  8:42 AM  Result Value Ref Range   Fecal Occult Bld POSITIVE (A) NEGATIVE  CBC     Status: Abnormal   Collection Time: 08/02/16  5:44 AM  Result Value Ref Range   WBC 4.7 4.0 - 10.5 K/uL   RBC 3.50 (L) 3.87 - 5.11 MIL/uL   Hemoglobin 11.3 (L) 12.0 - 15.0 g/dL   HCT 34.5 (L) 36.0 - 46.0 %   MCV 98.6 78.0 - 100.0 fL   MCH 32.3 26.0 - 34.0 pg   MCHC 32.8 30.0 - 36.0 g/dL   RDW 13.8 11.5 - 15.5 %   Platelets 259 150 - 400 K/uL  Basic metabolic panel     Status: Abnormal   Collection Time: 08/02/16  5:44 AM  Result Value Ref Range   Sodium 136 135 - 145 mmol/L   Potassium 4.2 3.5 - 5.1 mmol/L   Chloride 103 101 - 111 mmol/L   CO2 26 22 - 32 mmol/L   Glucose, Bld 105 (H) 65 - 99 mg/dL   BUN 32 (H) 6 - 20 mg/dL   Creatinine, Ser 1.21 (H) 0.44 - 1.00 mg/dL   Calcium 8.9 8.9 - 10.3 mg/dL   GFR calc non Af Amer 45 (L) >60 mL/min   GFR calc Af Amer 52 (L) >60 mL/min    Comment: (NOTE) The eGFR has been calculated using the CKD EPI equation. This calculation has not been validated in all clinical situations. eGFR's persistently <60 mL/min signify possible Chronic Kidney Disease.    Anion gap 7 5 - 15  Occult blood card to lab, stool RN will collect     Status: Abnormal   Collection Time: 08/02/16  4:06 PM  Result Value Ref  Range   Fecal Occult Bld POSITIVE (A) NEGATIVE  CBC     Status: Abnormal   Collection Time: 08/03/16  6:51 AM  Result Value Ref Range   WBC 7.8 4.0 - 10.5 K/uL   RBC 3.47 (L) 3.87 - 5.11 MIL/uL   Hemoglobin 11.5 (L) 12.0 - 15.0 g/dL   HCT 34.6 (L) 36.0 - 46.0 %   MCV 99.7 78.0 - 100.0 fL   MCH 33.1 26.0 - 34.0 pg   MCHC 33.2 30.0 - 36.0 g/dL   RDW 13.7 11.5 - 15.5 %   Platelets 278 150 - 400 K/uL    Constitutional: She appears well-developed. NAD. HENT: Normocephalic and atraumatic.  Eyes: EOM are normal. No discharge.  Cardiovascular: Irregular irregular  Respiratory: Effort normal and breath sounds normal.  GI: Soft. Bowel sounds are normal. She exhibits no distension. There is no tenderness.  Musculoskeletal: She exhibits no edema or tenderness.  Neurological:  She is alert and oriented.  Dysarthria with apraxia vs. Expressive Aphasia Motor: RUE: shoulder abduction, elbow flexion/extension 3+/5, wrist/hand 2/5  LUE: 5/5 proximal to distal RLE: 4+/5 proximal to distal  LLE:5/5 proximal to distal Sensation intact to light touch Skin: Skin is warm and dry.  Psychiatric: She has a normal mood and affect. Her behavior is normal.    Assessment/Plan: 1. 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, RIght field cut  Stable for D/C today F/u PCP in 3-4 weeks F/u PM&R 2 weeks See D/C summary See D/C instructions FIM: Function - Bathing Position: Standing at sink Body parts bathed by patient: Right arm, Chest, Abdomen, Front perineal area, Buttocks, Right upper leg, Left upper leg, Left arm, Right lower leg, Left lower leg, Back Bathing not applicable: Left arm, Right lower leg, Left lower leg, Back Assist Level: More than reasonable time  Function- Upper Body Dressing/Undressing What is the patient wearing?: Pull over shirt/dress Bra - Perfomed by patient: Thread/unthread right bra strap, Thread/unthread left bra strap,  Hook/unhook bra (pull down sports bra) Bra - Perfomed by helper: Hook/unhook bra (pull down sports bra) Pull over shirt/dress - Perfomed by patient: Thread/unthread right sleeve, Thread/unthread left sleeve, Put head through opening, Pull shirt over trunk Assist Level: More than reasonable time Function - Lower Body Dressing/Undressing What is the patient wearing?: Pants, Underwear, Non-skid slipper socks Position: Sitting EOB Underwear - Performed by patient: Thread/unthread right underwear leg, Thread/unthread left underwear leg, Pull underwear up/down Pants- Performed by patient: Thread/unthread right pants leg, Thread/unthread left pants leg, Pull pants up/down Non-skid slipper socks- Performed by patient: Don/doff right sock, Don/doff left sock Shoes - Performed by patient: Don/doff right shoe, Don/doff left shoe Shoes - Performed by helper: Fasten right, Fasten left Assist for footwear: Independent Assist for lower body dressing: More than reasonable time  Function - Toileting Toileting steps completed by patient: Adjust clothing prior to toileting, Performs perineal hygiene, Adjust clothing after toileting Toileting steps completed by helper: Adjust clothing prior to toileting [place]: Grab bar or rail Assist level: More than reasonable time  Function - Air cabin crew transfer assistive device: Grab bar Assist level to toilet: No Help, no cues, assistive device, takes more than a reasonable amount of time Assist level from toilet: No Help, no cues, assistive device, takes more than a reasonable amount of time  Function - Chair/bed transfer Chair/bed transfer method: Ambulatory Chair/bed transfer assist level: No Help, no cues, assistive device, takes more than a reasonable amount of time Chair/bed transfer assistive device: Armrests Chair/bed transfer details: Verbal cues for precautions/safety  Function - Locomotion: Wheelchair Will patient use  wheelchair at discharge?: No Wheelchair activity did not occur: N/A (pt ambulatory on unit) Function - Locomotion: Ambulation Assistive device: No device Max distance: 300 ft Assist level: No help, No cues, assistive device, takes more than a reasonable amount of time Assist level: No help, No cues, assistive device, takes more than a reasonable amount of time Assist level: No help, No cues, assistive device, takes more than a reasonable amount of time Assist level: No help, No cues, assistive device, takes more than a reasonable amount of time Assist level: No help, No cues, assistive device, takes more than a reasonable amount of time  Function - Comprehension Comprehension: Auditory Comprehension assist level: Follows complex conversation/direction with extra time/assistive device  Function - Expression Expression: Verbal Expression assistive device:  (slurred speech, expressive aphasia) Expression assist level: Expresses basic 90% of the time/requires cueing < 10% of the time.  Function - Social Interaction Social Interaction assist level: Interacts  appropriately with others with medication or extra time (anti-anxiety, antidepressant).  Function - Problem Solving Problem solving assist level: Solves basic problems with no assist  Function - Memory Memory assist level: More than reasonable amount of time Patient normally able to recall (first 3 days only): That he or she is in a hospital, Location of own room, Current season  Medical Problem List and Plan: 1.  Right hemiparesis and ?aphasia vs dysarthria secondary to left frontal infarct  Cont CIR PT, OT, SLP, plan d/c today 2.  DVT Prophylaxis/Anticoagulation: Eliquis. Venous Doppler studies negative 3. Pain Management/lupus: Ultram and Robaxin as needed. 4. Hypertension. Low BPs resolved after fluid bolus and d/c of lisinopril Vitals:   08/02/16 2215 08/03/16 0543  BP: (!) 90/49 (!) 126/56  Pulse: (!) 57 (!) 57  Resp:  18   Temp:  98 F (36.7 C)   5. Neuropsych: This patient is capable of making decisions on her own behalf. 6. Skin/Wound Care: Routine skin checks  7. Fluids/Electrolytes/Nutrition: Routine I&Os 8. Hyperlipidemia. Lipitor 9. Tobacco/cocaine abuse. Nicoderm patch. Provide counseling 10. AKI:   Encourage fluid intake BMP Latest Ref Rng & Units 08/02/2016 07/26/2016 07/24/2016  Glucose 65 - 99 mg/dL 105(H) 100(H) 108(H)  BUN 6 - 20 mg/dL 32(H) 18 10  Creatinine 0.44 - 1.00 mg/dL 1.21(H) 1.11(H) 1.01(H)  Sodium 135 - 145 mmol/L 136 139 138  Potassium 3.5 - 5.1 mmol/L 4.2 3.9 3.7  Chloride 101 - 111 mmol/L 103 106 101  CO2 22 - 32 mmol/L '26 27 31  ' Calcium 8.9 - 10.3 mg/dL 8.9 9.0 9.0   11.  Afib with cocaine use probable causative factors  12.  Macrocytic anemia  Hb stable, does have OB + stool f/u PCP and arrange for colonoscopy as outpt  Cont to monitor 13.  Spasticity R finger and wrist flexors  Hand splint with improvement, needs encouragement to wear it 14.  Small nose bleed add Ocean spray, 15.  Pre renal Azotemia, fluid bolus today, repeat BMET today if  Improved then d/c IV LOS (Days) [place] E 08/03/2016, 7:39 AM

## 2016-08-03 NOTE — Consult Note (Signed)
PSYCHODIAGNOSTIC EVALUATION - [facility]   [name] is a 70 year old woman, who was seen for an initial psychodiagnostic evaluation post CVA in order to assess for potential depression, anxiety or other mental illness.    During the session, Andrea Kennedy reported that she has been "frustrated" with the ramifications of her stroke.  She acknowledged depressed mood during the first two days post-stroke, but denied depressive symptoms since then.  She commented that she still occasionally gets angry because her speech is jumbled, but said that if she takes her time to speak, most people can understand what she is saying and that helps to calm her mood.  She also acknowledged some worry about discharging home owing to dynamics with her mother-in-law whom resides with her and her husband.  Time was spent during today's session processing the family dynamics and in assisting her in determining appropriate coping strategies to handle inter-personal conflict, particularly in light of her care needs post-stroke and recent history of cocaine abuse.  Andrea Kennedy stated that she has generated a back-up plan if the situation in her home is unacceptable post-discharge (i.e. she will stay with her daughter).    Andrea Kennedy said that she has been able to appreciate the physical progress that she is making on the rehab unit and has significant motivation to continue her recovery.  She reported sleeping and eating well and denied suicidal ideation.   IMPRESSION:  Andrea Kennedy did not describe symptoms to suggest the presence of any psychological disorder at this time.  We spent time during today's session helping her to re-discover her sense of self and consider prioritizing self-care to boost her recovery.  She was receptive to the therapeutic process.  Additional neuropsychological support could be requested should the treatment team feel that it would be beneficial in  informing care.    DIAGNOSIS:   Stroke  Andrea Kennedy, Psy.D.  Clinical Neuropsychologist

## 2016-08-04 ENCOUNTER — Telehealth: Payer: Self-pay

## 2016-08-04 NOTE — Telephone Encounter (Signed)
Transitional care call attempt 1, called patient, not accepting calls at this time message.

## 2016-08-05 NOTE — Telephone Encounter (Signed)
Contact made with Mrs Westergard 1. Are you/is patient experiencing any problems since coming home? Are there any questions regarding any aspect of care? No 2. Are there any questions regarding medications administration/dosing? Are meds being taken as prescribed? Patient should review meds with caller to confirm She has medication 3. Have there been any falls? No 4. Has Home Health been to the house and/or have they contacted you? If not, have you tried to contact them? Can we help you contact them?No home care going to outpt therapy 5. Are bowels and bladder emptying properly? Are there any unexpected incontinence issues? If applicable, is patient following bowel/bladder programs? No 6. Any fevers, problems with breathing, unexpected pain? No 7. Are there any skin problems or new areas of breakdown? No 8. Has the patient/family member arranged specialty MD follow up (ie cardiology/neurology/renal/surgical/etc)?  Can we help arrange? appts made and given appt today with Patel 9. Does the patient need any other services or support that we can help arrange? No 10. Are caregivers following through as expected in assisting the patient? Yes 11. Has the patient quit smoking, drinking alcohol, or using drugs as recommended?Yes  Appointment time 1:20 arrive by 1:00 to see Dr Posey Pronto and you will see Dr Letta Pate thereafter   [phone]

## 2016-08-11 ENCOUNTER — Ambulatory Visit: Payer: Medicare HMO

## 2016-08-11 ENCOUNTER — Ambulatory Visit: Payer: Medicare HMO | Admitting: Occupational Therapy

## 2016-08-11 ENCOUNTER — Encounter: Payer: Self-pay | Admitting: Occupational Therapy

## 2016-08-11 ENCOUNTER — Ambulatory Visit: Payer: Medicare HMO | Attending: Physical Medicine & Rehabilitation

## 2016-08-11 DIAGNOSIS — M25511 Pain in right shoulder: Secondary | ICD-10-CM

## 2016-08-11 DIAGNOSIS — R482 Apraxia: Secondary | ICD-10-CM

## 2016-08-11 DIAGNOSIS — R471 Dysarthria and anarthria: Secondary | ICD-10-CM | POA: Insufficient documentation

## 2016-08-11 DIAGNOSIS — H538 Other visual disturbances: Secondary | ICD-10-CM | POA: Diagnosis present

## 2016-08-11 DIAGNOSIS — R4701 Aphasia: Secondary | ICD-10-CM | POA: Insufficient documentation

## 2016-08-11 DIAGNOSIS — R41842 Visuospatial deficit: Secondary | ICD-10-CM

## 2016-08-11 DIAGNOSIS — R29818 Other symptoms and signs involving the nervous system: Secondary | ICD-10-CM | POA: Insufficient documentation

## 2016-08-11 DIAGNOSIS — I69351 Hemiplegia and hemiparesis following cerebral infarction affecting right dominant side: Secondary | ICD-10-CM

## 2016-08-11 DIAGNOSIS — R2689 Other abnormalities of gait and mobility: Secondary | ICD-10-CM | POA: Insufficient documentation

## 2016-08-11 DIAGNOSIS — M6281 Muscle weakness (generalized): Secondary | ICD-10-CM

## 2016-08-11 NOTE — Patient Instructions (Signed)
Bring in those exercises for your lips and tongue next time! Continue to slow down your talking and overarticulate.

## 2016-08-11 NOTE — Therapy (Signed)
[facility] [address] [place], [place], [address] Phone: [phone]   Fax:  [phone]  Occupational Therapy Evaluation  Patient Details  Name: Andrea Kennedy MRN: [id number] Date of Birth: 1945-10-07 Referring Provider: Dr.  Naaman Plummer  Encounter Date: 08/11/2016      OT End of Session - 08/11/16 1248    Visit Number 1   Number of Visits 16   Date for OT Re-Evaluation 10/06/16   Authorization Type [employer] medicare - await info regarding any visit limitations  Will need G code and PN every 10th visit   Authorization - Visit Number 1   Authorization - Number of Visits 10   OT Start Time 1018   OT Stop Time 1057   OT Time Calculation (min) 39 min   Activity Tolerance Patient tolerated treatment well      Past Medical History:  Diagnosis Date  . Lupus   . Sciatica of right side   . Tobacco abuse     Past Surgical History:  Procedure Laterality Date  . TOTAL HIP ARTHROPLASTY Right 06/03/2015   Procedure: RIGHT TOTAL HIP ARTHROPLASTY ANTERIOR APPROACH;  Surgeon: Gaynelle Arabian, MD;  Location: [facility];  Service: Orthopedics;  Laterality: Right;  . TUBAL LIGATION      There were no vitals filed for this visit.      Subjective Assessment - 08/11/16 1022    Subjective  I want to be able to do my own hair and be able to take care of myself   Pertinent History see epic pt with L CVA   Patient Stated Goals be able to talk better and take care of yourself.   Currently in Pain? Yes   Pain Score 5    Pain Location Shoulder   Pain Orientation Right   Pain Descriptors / Indicators Aching;Sore   Pain Type Acute pain   Pain Onset More than a month ago   Pain Frequency Constant   Aggravating Factors  movement  pt states she had arthritis before but very little pain in R shoulder  States pain much worse in R shoulder since stroke.   Pain Relieving Factors meds           [facility] OT Assessment - 08/11/16 1023      Assessment   Diagnosis L CVA   Referring Provider Dr.  Naaman Plummer   Onset Date 07/20/16   Prior Therapy Pt had inpt rehab PT, OT and ST.       Precautions   Precautions Fall     Restrictions   Weight Bearing Restrictions No     Balance Screen   Has the patient fallen in the past 6 months No     Home  Environment   Family/patient expects to be discharged to: Private residence   Living Arrangements Spouse/significant other  and mother in law   Available Help at Discharge Available 24 hours/day   Type of [place] One level   Bathroom Shower/Tub Tub/Shower unit   [employer] Standard   Additional Comments Pt has grab bar in shower and had counter she can use if she needs it to stand from toilet.  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     ADL   Eating/Feeding Minimal assistance  for cutting, and using  non dominant hand   Grooming Minimal assistance  min a for hair   Upper Body Bathing Modified independent   Lower Body Bathing Modified independent   Upper Body Dressing Independent   Lower Body Dressing Minimal assistance  zippers   Toilet Tranfer Modified independent   Toileting - Clothing Manipulation Modified independent   Toileting -  Hygiene Modified Independent   Tub/Shower Transfer Modified independent     IADL   Shopping Assistance for transportation   Light Housekeeping Performs light daily tasks such as dishwashing, bed making  with assistance   Meal Prep Needs to have meals prepared and served  pt did all cooking before   [employer] on family or friends for transportation   Medication Management --  needs assist to open bottles but manages all meds mod I     Mobility   Mobility Status Independent     Written Expression   Dominant Hand Right   Handwriting 75% legible  printing  first name with non dominant hand     Vision - History   Baseline Vision No visual deficits     Vision Assessment   Ocular Range of Motion Within Functional Limits   Tracking/Visual Pursuits Able to track stimulus in all quads without difficulty   Convergence Within functional limits   Acuity --  pt reports intermittent blurry vision when she watches tv   Diplopia Assessment --  denies   Comment Pt reports R field deficit - "looks like I am seeing thru a gray fog".  Begins at approximately 30* from midline to the pt's right in superior, middle and inferior fields (uniformly)     Activity Tolerance   Activity Tolerance Tolerates 10-20 min activity with muiltiple rests   Activity Tolerance Comments Can walk about 10 minutes then becomes tired.       Cognition   Overall Cognitive Status Impaired/Different from baseline   Mini Mental State Exam  Pt reports concentration problems when she is trying to read.  Suspect this is language based issue as pt reports it is only when she is attempting to read.      Sensation   Light Touch Appears Intact  tingling in fingertips but able to localize lt touch   Hot/Cold Appears Intact   Proprioception Appears Intact     Coordination   Gross Motor Movements are Fluid and Coordinated No   Fine Motor Movements are Fluid and Coordinated No   Other Pt unable to do 9 hole peg or Box and Blocks at this time due to limited hand function.       Praxis   Praxis Impaired   Praxis Impairment Details Ideation     Tone   Assessment Location Right Upper Extremity     ROM / Strength   AROM / PROM / Strength AROM;PROM;Strength     AROM   Overall AROM  Deficits   Overall AROM Comments Pt with full proximal AROM shoulder to elbow.  Pt with 25% of supination, wrist flexion/extension in gravity eliminated only, trace isolated finger flexion, no finger extension.     PROM   Overall PROM  Within functional limits for tasks performed   Overall PROM Comments  RUE     Strength   Overall Strength Deficits   Overall Strength Comments Pt with 3+/5 for horizontal abduction, 4/5 for shoulder extension, trace isolated movement for fleixon of hand, no active finger extension, pt is able to reproduce isolated wrist flexion/extension in gravity eliminated with  repetition - impacted by apraxia.      Hand Function   Right Hand Gross Grasp Impaired   Right Hand Grip (lbs) unable to test due to limited isolated control    Left Hand Gross Grasp Functional     RUE Tone   RUE Tone Moderate  more pronounced distally in wrist/hand                           OT Short Term Goals - 08/11/16 1229      OT SHORT TERM GOAL #1   Title Pt will be mod I with HEP- 09/08/2016   Status New     OT SHORT TERM GOAL #2   Title Pt will demonstrate ability to orient R hand for grasp with no more than min facilitation in prep for functional hand use.    Status New     OT SHORT TERM GOAL #3   Title Pt will demonstrate ability for active grasp with cylindrical object with min facilitation   Status New     OT SHORT TERM GOAL #4   Title Pt will report no more than 3/5 pain in R shoulder with shoulder flexion to 120* for overhead reach     OT SHORT TERM GOAL #5   Title Pt will be min a for simple familiar hot meal prep at ambulatory level.    Status New     OT SHORT TERM GOAL #6   Title Pt will be mod I with zipping and cutting AE prn   Status New           OT Long Term Goals - 08/11/16 1234      OT LONG TERM GOAL #1   Title Pt will be mod I with upgraded HEP - 10/06/2016   Status New     OT LONG TERM GOAL #2   Title Pt will demonstrate abilty to release cylindrical object with min faciltiation   Status New     OT LONG TERM GOAL #3   Title Pt will demonstrate ability to grasp cylindrical object mod I   Status New     OT LONG TERM GOAL #4   Title Pt will demonstrate ability to use RUE as gross assist during basic ADL tasks, simple IADl  tasks at least 50% of the time.   Status New     OT LONG TERM GOAL #5   Title Pt will complete visual environmental scanning activity with at least 95% accuracy   Status New     Long Term Additional Goals   Additional Long Term Goals Yes     OT LONG TERM GOAL #6   Title Pt will be able to write full name legibly with non dominant hand   Status New     OT LONG TERM GOAL #7   Title Pt will be mod I with simple hot meal prep   Status New     OT LONG TERM GOAL #8   Title Pt will tolerate at least 25 minutes of activity without rest breaks.    Status New               Plan - 08/11/16 1239    Clinical Impression Statement Pt is a 70 year old female s/p L CVA on 12/6/217.  Pt discharged home on 07/28/16 after brief inpt rehab stay.  PMH: lupus, arthritis multiple joints, R hip fx, sciatica R side, h/o falls, cocaine abuse,  HTN.  Pt presents today with the following that impacts her ability to complete ADL's, IADL's and leisure activities:  R dominant hemiplegia, muscle weakness, altered tone, apraxia, decrease AROM, pain in R shoulder, decreased activity tolerance, visual spatial deficit, language deficit. Pt will benefit from skilled OT to address these deficts and maximize independence.     Rehab Potential Good   OT Frequency 2x / week   OT Duration 8 weeks   OT Treatment/Interventions Self-care/ADL training;Electrical Stimulation;Moist Heat;Ultrasound;Therapeutic exercise;Neuromuscular education;Energy conservation;DME and/or AE instruction;Manual Therapy;Splinting;Therapeutic activities;Visual/perceptual remediation/compensation;Patient/family education   Plan initiate HEP as possible, NMR for RUE, consider estim   Consulted and Agree with Plan of Care Patient      Patient will benefit from skilled therapeutic intervention in order to improve the following deficits and impairments:  Decreased activity tolerance, Decreased coordination, Decreased range of motion, Decreased  knowledge of use of DME, Decreased strength, Impaired UE functional use, Impaired tone, Impaired sensation, Impaired vision/preception, Pain  Visit Diagnosis: Hemiplegia and hemiparesis following cerebral infarction affecting right dominant side ([place]) - Plan: Ot plan of care cert/re-cert  Acute pain of right shoulder - Plan: Ot plan of care cert/re-cert  Muscle weakness (generalized) - Plan: Ot plan of care cert/re-cert  Other symptoms and signs involving the nervous system - Plan: Ot plan of care cert/re-cert  Visual spatial disorder - Plan: Ot plan of care cert/re-cert  Apraxia - Plan: Ot plan of care cert/re-cert      G-Codes - 16/01/09 1251    Functional Assessment Tool Used skilled clinical observation, functional use.  Pt unable at this time to do standardized tests   Functional Limitation Carrying, moving and handling objects   Carrying, Moving and Handling Objects Current Status [phone]) At least 80 percent but less than 100 percent impaired, limited or restricted   Carrying, Moving and Handling Objects Goal Status (D3220) At least 20 percent but less than 40 percent impaired, limited or restricted      Problem List Patient Active Problem List   Diagnosis Date Noted  . Aphasia as late effect of stroke 08/01/2016  . Spasticity   . Frontal lobe deficit 07/25/2016  . Right hemiparesis ([place])   . Dysarthria, post-stroke   . Benign essential HTN   . Pure hypercholesterolemia   . Tobacco abuse   . AKI (acute kidney injury) ([place])   . Paroxysmal atrial fibrillation (HCC)   . Cocaine abuse   . Hyperlipidemia   . Stroke ([place]) 07/20/2016  . Stroke (cerebrum) ([place]) - L frontal embolic s/p IV tPA, d/t AF 07/20/2016  . Alteration in neurological status   . Postoperative anemia due to acute blood loss 06/05/2015  . Hip fracture, right ([place])   . Fracture of hip, right, closed ([place]) 06/03/2015  . Fall 06/03/2015  . Leukocytosis 06/03/2015  . Lupus   . Smoker   . Sciatica of  right side     Quay Burow, OTR/L 08/11/2016, 12:53 PM  [facility] [address] [place], [place], [address] Phone: [phone]   Fax:  [phone]  Name: Shalicia Craghead MRN: [id number] Date of Birth: 1945-08-18
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
## 2016-08-11 NOTE — Addendum Note (Signed)
Addended by: Elza Rafter on: 08/11/2016 11:59 AM   Modules accepted: Orders

## 2016-08-12 NOTE — Therapy (Signed)
[facility] [address] [place], [place], [address] Phone: [phone]   Fax:  [phone]  Speech Language Pathology Evaluation  Patient Details  Name: Andrea Kennedy MRN: [id number] Date of Birth: April 26, 1946 Referring Provider: Alysia Penna MD  Encounter Date: 08/11/2016      End of Session - 08/11/16 1708    Visit Number 1   Number of Visits 17   Date for SLP Re-Evaluation 10/28/16   SLP Start Time 1104   SLP Stop Time  1146   SLP Time Calculation (min) 42 min   Activity Tolerance Patient tolerated treatment well      Past Medical History:  Diagnosis Date  . Lupus   . Sciatica of right side   . Tobacco abuse     Past Surgical History:  Procedure Laterality Date  . TOTAL HIP ARTHROPLASTY Right 06/03/2015   Procedure: RIGHT TOTAL HIP ARTHROPLASTY ANTERIOR APPROACH;  Surgeon: Gaynelle Arabian, MD;  Location: [facility];  Service: Orthopedics;  Laterality: Right;  . TUBAL LIGATION      There were no vitals filed for this visit.      Subjective Assessment - 08/11/16 1112    Subjective "I -just -need -people to have more -patience -with -me as I try -to -tell -them -something."            SLP Evaluation [facility] - 08/11/16 1118      SLP Visit Information   SLP Received On 08/11/16   Referring Provider Alysia Penna MD   Onset Date 07-20-16   Medical Diagnosis lt CVA     Pain Assessment   Currently in Pain? Yes   Pain Score 5    Pain Location Shoulder   Pain Orientation Right   Pain Type Acute pain   Pain Onset 1 to 4 weeks ago   Pain Frequency Constant   Pain Relieving Factors meds     General Information   HPI Andrea Kennedy a 70 y.o.femalewho presented to the Emergency Department with right sided weakness, right sided facial droop, right neglect and aphasia. CT minimal propagation of LEFT frontal lobe acute infarct. No hemorrhagic conversion. Pt underwent approx 2 weeks ST on CIR  focusing on dysphagia, dysarthria, and aphasia. Pt reports anomia has mostly resolved and dysnomia predominiates, rarely. Pt reports she must eat slowly and use smaller sips/bites with regular diet. Dysarthria apparent.     Prior Functional Status   Cognitive/Linguistic Baseline Within functional limits   Type of Home House    Lives With Spouse;Family     Cognition   Overall Cognitive Status Within Functional Limits for tasks assessed     Auditory Comprehension   Overall Auditory Comprehension Appears within functional limits for tasks assessed     Verbal Expression   Overall Verbal Expression Appears within functional limits for tasks assessed     Oral Motor/Sensory Function   Overall Oral Motor/Sensory Function Impaired   Labial ROM Reduced right   Labial Symmetry Abnormal symmetry right   Labial Strength Reduced Right   Labial Sensation Reduced Right   Labial Coordination Reduced   Lingual ROM Reduced right   Lingual Symmetry Abnormal symmetry right   Lingual Strength Reduced   Lingual Coordination Reduced   Facial Symmetry Right droop;Right drooping eyelid   Facial Strength Reduced  slight   Velum Impaired right  slight     Motor Speech   Overall Motor Speech Impaired   Respiration Within functional limits   Phonation Normal  Resonance Within functional limits   Articulation Impaired   Level of Impairment Sentence   Intelligibility Intelligibility reduced   Word 75-100% accurate  90%   Phrase 75-100% accurate  90%   Sentence 75-100% accurate  90%   Conversation 75-100% accurate  80-85% if not reducing rate   Motor Planning Impaired   Level of Impairment Sentence   Motor Speech Errors Aware;Consistent   Effective Techniques Slow rate;Over-articulate                           SLP Short Term Goals - September 10, 2016 1711      SLP SHORT TERM GOAL #1   Title pt will perform dysarthria HEP with rare min A over three sessions   Time 4   Period  Weeks   Status New     SLP SHORT TERM GOAL #2   Title pt will demo compensations for apraxia/dysarthria in 5 minutes simple conversation to achieve 100% intelligibility   Time 4   Period Weeks   Status New          SLP Long Term Goals - 09/10/2016 1713      SLP LONG TERM GOAL #1   Title pt will perform dysarthria HEP with modified independence over three sessions   Time 8   Period Weeks  or 16 visits, for all LTGs   Status New     SLP LONG TERM GOAL #2   Title pt will participate in 15 minutes mod complex conversation using compensations for dysarthria/apraxia/aphasia, in order to achieve 100% intelligibility over 3 sessions   Time 8   Period Weeks   Status New          Plan - 09/10/2016 1708    Clinical Impression Statement Pt presents today with dysarthria and 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 She would certaianly benefit from skilled ST to address her dysarthria and apraxia (dysarthria most hindering at this time). Pt reports decr in QOL due to these deficits.   Speech Therapy Frequency 2x / week   Duration --  8 weeks, or 16 total visits in 90 days   Treatment/Interventions Compensatory techniques;Internal/external aids;SLP instruction and feedback;Language facilitation;Multimodal communcation approach;Patient/family education;Functional tasks;Cueing hierarchy;Oral motor exercises;Compensatory strategies   Potential to Achieve Goals Good   Potential Considerations Severity of impairments   Consulted and Agree with Plan of Care Patient      Patient will benefit from skilled therapeutic intervention in order to improve the following deficits and impairments:   Apraxia  Dysarthria and anarthria  Aphasia      G-Codes - 2016-09-10 1706    Functional Assessment Tool Used NOMS-    Functional Limitations Motor speech   Motor  Speech Current Status [phone]) At least 40 percent but less than 60 percent impaired, limited or restricted   Motor Speech Goal Status (C1448) At least 1 percent but less than 20 percent impaired, limited or restricted      Problem List Patient Active Problem List   Diagnosis Date Noted  . Aphasia as late effect of stroke 08/01/2016  . Spasticity   . Frontal lobe deficit 07/25/2016  . Right hemiparesis ([place])   . Dysarthria, post-stroke   . Benign essential HTN   . Pure hypercholesterolemia   . Tobacco abuse   . AKI (acute kidney injury) ([place])   . Paroxysmal atrial fibrillation (H[facility])   .  Cocaine abuse   . Hyperlipidemia   . Stroke ([place]) 07/20/2016  . Stroke (cerebrum) ([place]) - L frontal embolic s/p IV tPA, d/t AF 07/20/2016  . Alteration in neurological status   . Postoperative anemia due to acute blood loss 06/05/2015  . Hip fracture, right ([place])   . Fracture of hip, right, closed ([place]) 06/03/2015  . Fall 06/03/2015  . Leukocytosis 06/03/2015  . Lupus   . Smoker   . Sciatica of right side     Andrea Kennedy ,MS, CCC-SLP  08/12/2016, 5:15 PM  [facility] [address] [place], [place], [address] Phone: [phone]   Fax:  [phone]  Name: Andrea Kennedy MRN: [id number] Date of Birth: March 14, 1946

## 2016-08-18 ENCOUNTER — Ambulatory Visit: Payer: Medicare HMO | Admitting: Occupational Therapy

## 2016-08-18 ENCOUNTER — Encounter: Payer: Medicare HMO | Admitting: Physical Medicine & Rehabilitation

## 2016-08-23 ENCOUNTER — Ambulatory Visit: Payer: Medicare HMO | Attending: Physical Medicine & Rehabilitation | Admitting: Physical Therapy

## 2016-08-23 ENCOUNTER — Ambulatory Visit: Payer: Medicare HMO | Admitting: Occupational Therapy

## 2016-08-23 ENCOUNTER — Encounter: Payer: Self-pay | Admitting: Occupational Therapy

## 2016-08-23 DIAGNOSIS — I69351 Hemiplegia and hemiparesis following cerebral infarction affecting right dominant side: Secondary | ICD-10-CM | POA: Diagnosis present

## 2016-08-23 DIAGNOSIS — R471 Dysarthria and anarthria: Secondary | ICD-10-CM | POA: Diagnosis present

## 2016-08-23 DIAGNOSIS — M25511 Pain in right shoulder: Secondary | ICD-10-CM

## 2016-08-23 DIAGNOSIS — R29818 Other symptoms and signs involving the nervous system: Secondary | ICD-10-CM | POA: Diagnosis present

## 2016-08-23 DIAGNOSIS — R482 Apraxia: Secondary | ICD-10-CM | POA: Insufficient documentation

## 2016-08-23 DIAGNOSIS — H538 Other visual disturbances: Secondary | ICD-10-CM | POA: Diagnosis present

## 2016-08-23 DIAGNOSIS — M6281 Muscle weakness (generalized): Secondary | ICD-10-CM

## 2016-08-23 DIAGNOSIS — R2689 Other abnormalities of gait and mobility: Secondary | ICD-10-CM | POA: Diagnosis present

## 2016-08-23 DIAGNOSIS — R41842 Visuospatial deficit: Secondary | ICD-10-CM

## 2016-08-23 DIAGNOSIS — R4701 Aphasia: Secondary | ICD-10-CM | POA: Diagnosis present

## 2016-08-23 NOTE — Therapy (Signed)
[facility] [address] [place], [place], [address] Phone: [phone]   Fax:  [phone]  Physical Therapy Treatment  Patient Details  Name: Andrea Kennedy MRN: [id number] Date of Birth: 12/15/1945 Referring Provider: Dr. Kirsteins  Encounter Date: 08/23/2016      PT End of Session - 08/23/16 1228    Visit Number 2   Number of Visits 9   Date for PT Re-Evaluation 09/10/16   Authorization Type [employer]: email sent to Lisa to verify insurance.   PT Start Time 1146   PT Stop Time 1230   PT Time Calculation (min) 44 min   Equipment Utilized During Treatment Gait belt   Activity Tolerance Patient tolerated treatment well   Behavior During Therapy WFL for tasks assessed/performed      Past Medical History:  Diagnosis Date  . Lupus   . Sciatica of right side   . Tobacco abuse     Past Surgical History:  Procedure Laterality Date  . TOTAL HIP ARTHROPLASTY Right 06/03/2015   Procedure: RIGHT TOTAL HIP ARTHROPLASTY ANTERIOR APPROACH;  Surgeon: Frank Aluisio, MD;  Location: [facility];  Service: Orthopedics;  Laterality: Right;  . TUBAL LIGATION      There were no vitals filed for this visit.      Subjective Assessment - 08/23/16 1148    Subjective tired today, had a cold last week   Pertinent History Hx of R THA, a-fib, Lupus, hx of cocaine abuse, benign essential HTN, hyperlipidemia   Currently in Pain? Yes   Effect of Pain on Daily Activities see OT note for details            [facility] PT Assessment - 08/23/16 1149      ROM / Strength   AROM / PROM / Strength Strength     Strength   Strength Assessment Site Hip   Right/Left Hip Right;Left   Right Hip Extension 3/5   Left Hip Extension 3+/5     6 Minute Walk- Baseline   6 Minute Walk- Baseline yes   BP (mmHg) 108/78   HR (bpm) 76   02 Sat (%RA) 97 %   Modified Borg Scale for Dyspnea 0- Nothing at all   Perceived Rate of Exertion (Borg) 7-  Very, very light     6 Minute walk- Post Test   6 Minute Walk Post Test yes   BP (mmHg) 108/65   HR (bpm) 93   02 Sat (%RA) 96 %   Modified Borg Scale for Dyspnea 2- Mild shortness of breath   Perceived Rate of Exertion (Borg) 9- very light     6 minute walk test results    Aerobic Endurance Distance Walked 1126                     [facility] Adult PT Treatment/Exercise - 08/23/16 1218      Exercises   Exercises Knee/Hip     Knee/Hip Exercises: Standing   Hip Flexion Stengthening;Right;Left;20 reps   Hip Flexion Limitations Alternating LE, bilat UE support   Hip Abduction Stengthening;Right;Left;20 reps   Abduction Limitations alternating LE, bilat UE support   Hip Extension Stengthening;Right;Left;20 reps;Knee straight   Extension Limitations alternating LE; bilat UE support             Balance Exercises - 08/23/16 1220      OTAGO PROGRAM   Sideways Walking No assistive device   Tandem Walk No support   Sit to Stand   10 reps, no support   Overall OTAGO Comments Also performed side stepping with forwards and backwards cross overs without UE support           PT Education - 08/23/16 1227    Education provided Yes   Education Details Pt educated on standing LE and balance HEP; provided handout and recommendations for safety.   Person(s) Educated Patient   Methods Explanation;Demonstration;Handout   Comprehension Verbalized understanding;Returned demonstration          PT Short Term Goals - 08/11/16 1035      PT SHORT TERM GOAL #1   Title same as LTGs           PT Long Term Goals - 08/23/16 1228      PT LONG TERM GOAL #1   Title Pt will verbalize understanding of CVA risk factor and signs/sx's to reduce risk of additional CVA. TARGET DATE FOR ALL LTGS: 09/08/16   Status New     PT LONG TERM GOAL #2   Title Pt will be IND in HEP to improve endurance, flexibility and balance.    Status New     PT LONG TERM GOAL #3   Title Pt will improve  FGA score to >/=27/30 to reduce falls risk.    Status New     PT LONG TERM GOAL #4   Title Perform 6 MWT and write goal as indicated.    Status Achieved     PT LONG TERM GOAL #5   Title Pt will be able to perform squat and bending forward to pick item off floor without UE support, IND, in order to perform yard work.   Status New     Additional Long Term Goals   Additional Long Term Goals Yes     PT LONG TERM GOAL #6   Title Pt will improve overall endurance as indicated by improvement in 6 MWT by 150 ft   Baseline 1126 ft   Status New               Plan - 08/23/16 1235    Clinical Impression Statement Pt amb 1126' during 6MWT with min increase in SOB and RPE.  Initiated HEP today for balance and strengthening.  No goals met as only 2nd visit.  Will continue to benefit from PT to address deficits.   PT Treatment/Interventions ADLs/Self Care Home Management;Biofeedback;Canalith Repostioning;Neuromuscular re-education;Cognitive remediation;Electrical Stimulation;Therapeutic activities;Therapeutic exercise;Balance training;Manual techniques;Functional mobility training;Stair training;Gait training;Orthotic Fit/Training;DME Instruction;Patient/family education;Vestibular   PT Next Visit Plan review HEP, walking program   Consulted and Agree with Plan of Care Patient      Patient will benefit from skilled therapeutic intervention in order to improve the following deficits and impairments:  Abnormal gait, Decreased endurance, Decreased balance, Decreased mobility, Dizziness, Decreased cognition, Decreased safety awareness, Impaired flexibility, Decreased strength, Decreased knowledge of use of DME  Visit Diagnosis: Other abnormalities of gait and mobility  Hemiplegia and hemiparesis following cerebral infarction affecting right dominant side [facility])     Problem List Patient Active Problem List   Diagnosis Date Noted  . Aphasia as late effect of stroke 08/01/2016  . Spasticity    . Frontal lobe deficit 07/25/2016  . Right hemiparesis ([place])   . Dysarthria, post-stroke   . Benign essential HTN   . Pure hypercholesterolemia   . Tobacco abuse   . AKI (acute kidney injury) ([place])   . Paroxysmal atrial fibrillation ([facility])   . Cocaine abuse   . Hyperlipidemia   . Stroke (  [place]) 07/20/2016  . Stroke (cerebrum) ([place]) - L frontal embolic s/p IV tPA, d/t AF 07/20/2016  . Alteration in neurological status   . Postoperative anemia due to acute blood loss 06/05/2015  . Hip fracture, right ([place])   . Fracture of hip, right, closed ([place]) 06/03/2015  . Fall 06/03/2015  . Leukocytosis 06/03/2015  . Lupus   . Smoker   . Sciatica of right side       Laureen Abrahams, PT, DPT 08/23/16 12:39 PM    [facility] [address] [place], [place], [address] Phone: [phone]   Fax:  [phone]  Name: Andrea Kennedy MRN: [id number] Date of Birth: 01-10-1946

## 2016-08-23 NOTE — Patient Instructions (Addendum)
Standing Marching   Using a chair if necessary, march in place alternating legs. Repeat _10_ times each leg. Do _2-3_ sessions per day.  http://gt2.exer.us/344   Copyright  VHI. All rights reserved.   Hip Backward Kick   Using a chair for balance, keep legs shoulder width apart and toes pointed forward. Slowly extend one leg back, keeping knee straight. Do not lean forward. Repeat with other leg.  Continue to alternate legs. Repeat _10___ times each leg. Do _2-3_ sessions per day.  http://gt2.exer.us/340   Copyright  VHI. All rights reserved.   Hip Side Kick   Holding a chair for balance, keep legs shoulder width apart and toes pointed forward. Swing a leg out to side, keeping knee straight. Do not lean. Repeat using other leg.  Continue to alternate legs. Repeat _10__ times each leg. Do _2-3_ sessions per day.  http://gt2.exer.us/342   Copyright  VHI. All rights reserved.     Feet Heel-Toe "Tandem"    Arms outstretched, walk a straight line bringing one foot directly in front of the other. Perform 10 steps. Do _2-3___ sessions per day.  Copyright  VHI. All rights reserved.   Side-Stepping    Walk to left side with eyes open. Take even steps, leading with same foot. Make sure each foot lifts off the floor. Repeat in opposite direction. Perform 10 steps to left, 10 steps to right. Do _2-3_ sessions per day.  Copyright  VHI. All rights reserved.     Functional Quadriceps: Sit to Stand    Sit on edge of chair, feet flat on floor. Stand upright, extending knees fully.  Keeps arms cross across chest. Repeat __10__ times per set. Do __1__ sets per session. Do _2-3__ sessions per day.  http://orth.exer.us/735   Copyright  VHI. All rights reserved.

## 2016-08-23 NOTE — Patient Instructions (Signed)
Your Splint This splint should initially be fitted by a healthcare practitioner.  The healthcare practitioner is responsible for providing wearing instructions and precautions to the patient, other healthcare practitioners and care provider involved in the patient's care.  This splint was custom made for you. Please read the following instructions to learn about wearing and caring for your splint.  Precautions Should your splint cause any of the following problems, remove the splint immediately and contact your therapist/physician.  Swelling  Severe Pain  Pressure Areas  Stiffness  Numbness  Do not wear your splint while operating machinery unless it has been fabricated for that purpose.  When To Wear Your Splint Where your splint according to your therapist/physician instructions.  Day One;  2 hours in the morning, 2 hours in the evening Day Two:  If no problems, 4 hours in the morning, 4 hours in the evening Day Three: If no problems ONLY at night when sleeping.  As of Day three you will only wear the splint at night never during the day.   Care and Cleaning of Your Splint 1. Keep your splint away from open flames. 2. Your splint will lose its shape in temperatures over 135 degrees Farenheit, ( in car windows, near radiators, ovens or in hot water).  Never make any adjustments to your splint, if the splint needs adjusting remove it and make an appointment to see your therapist. 3. Your splint, including the cushion liner may be cleaned with soap and lukewarm water.  Do not immerse in hot water over 135 degrees Farenheit. 4. Straps may be washed with soap and water, but do not moisten the self-adhesive portion. 5. For ink or hard to remove spots use a scouring cleanser which contains chlorine.  Rinse the splint thoroughly after using chlorine cleanser.

## 2016-08-23 NOTE — Therapy (Signed)
[facility] [address] [place], [place], [address] Phone: [phone]   Fax:  [phone]  Occupational Therapy Treatment  Patient Details  Name: Andrea Kennedy MRN: [id number] Date of Birth: April 04, 1946 Referring Provider: Dr.  Naaman Plummer  Encounter Date: 08/23/2016      OT End of Session - 08/23/16 1253    Visit Number 2   Number of Visits 16   Date for OT Re-Evaluation 10/06/16   Authorization Type [employer] medicare - await info regarding any visit limitations  Will need G code and PN every 10th visit   Authorization - Visit Number 2   Authorization - Number of Visits 10   OT Start Time 1101   OT Stop Time 1145   OT Time Calculation (min) 44 min   Activity Tolerance Patient tolerated treatment well      Past Medical History:  Diagnosis Date  . Lupus   . Sciatica of right side   . Tobacco abuse     Past Surgical History:  Procedure Laterality Date  . TOTAL HIP ARTHROPLASTY Right 06/03/2015   Procedure: RIGHT TOTAL HIP ARTHROPLASTY ANTERIOR APPROACH;  Surgeon: Gaynelle Arabian, MD;  Location: [facility];  Service: Orthopedics;  Laterality: Right;  . TUBAL LIGATION      There were no vitals filed for this visit.      Subjective Assessment - 08/23/16 1106    Subjective  I was sick with a cold and now I think I am having a lupus flair up   Pertinent History see epic pt with L CVA   Patient Stated Goals be able to talk better and take care of yourself.   Currently in Pain? Yes   Pain Score 8    Pain Location Shoulder   Pain Orientation Right;Left   Pain Descriptors / Indicators Aching;Sore   Pain Type Chronic pain   Pain Onset More than a month ago   Pain Frequency Constant   Aggravating Factors  movement pt states she had arthris before but pain worse in shoulders now.     Multiple Pain Sites Yes   Pain Score 5   Pain Location Neck   Pain Orientation Mid   Pain Descriptors / Indicators Dull;Aching   Pain  Type Chronic pain   Pain Onset More than a month ago   Pain Frequency Intermittent   Aggravating Factors  cold weather   Pain Relieving Factors heat, alternate heat and ice                      OT Treatments/Exercises (OP) - 08/23/16 0001      ADLs   ADL Comments Reviewed eval, goals, POC and role of OT. Pt in agreement and written copy of goals provided.      Splinting   Splinting Pt reporting pain in thumb on R hand and with questioning reports that she sleeps on the hand and finds she is bending back the wrist and thumb but has poor sensation.  Started fabrication of resting hand splint to be worn at night once pt builds tolerance.  Will complete next session.  Provided education regarding splint wear and care and pt verbalized understanding.  Will provide in wriiting at next visit.              Balance Exercises - 08/23/16 1220      OTAGO PROGRAM   Sideways Walking No assistive device   Tandem Walk No support   Sit to Stand  10 reps, no support   Overall OTAGO Comments Also performed side stepping with forwards and backwards cross overs without UE support           OT Education - 08/23/16 1250    Education provided Yes   Education Details splint wear and care   Person(s) Educated Patient   Methods Explanation  hand out will be given next session   Comprehension Verbalized understanding          OT Short Term Goals - 08/23/16 1251      OT SHORT TERM GOAL #1   Title Pt will be mod I with HEP- 09/08/2016   Status On-going     OT SHORT TERM GOAL #2   Title Pt will demonstrate ability to orient R hand for grasp with no more than min facilitation in prep for functional hand use.    Status On-going     OT SHORT TERM GOAL #3   Title Pt will demonstrate ability for active grasp with cylindrical object with min facilitation   Status On-going     OT SHORT TERM GOAL #4   Title Pt will report no more than 3/5 pain in R shoulder with shoulder flexion  to 120* for overhead reach   Status On-going     OT SHORT TERM GOAL #5   Title Pt will be min a for simple familiar hot meal prep at ambulatory level.    Status On-going     OT SHORT TERM GOAL #6   Title Pt will be mod I with zipping and cutting AE prn   Status On-going     OT SHORT TERM GOAL #7   Title Pt will demonstate understanding of splint wear and care.    Status New           OT Long Term Goals - 08/23/16 1251      OT LONG TERM GOAL #1   Title Pt will be mod I with upgraded HEP - 10/06/2016   Status On-going     OT LONG TERM GOAL #2   Title Pt will demonstrate abilty to release cylindrical object with min faciltiation   Status On-going     OT LONG TERM GOAL #3   Title Pt will demonstrate ability to grasp cylindrical object mod I   Status On-going     OT LONG TERM GOAL #4   Title Pt will demonstrate ability to use RUE as gross assist during basic ADL tasks, simple IADl tasks at least 50% of the time.   Status On-going     OT LONG TERM GOAL #5   Title Pt will complete visual environmental scanning activity with at least 95% accuracy   Status On-going     OT LONG TERM GOAL #6   Title Pt will be able to write full name legibly with non dominant hand   Status On-going     OT LONG TERM GOAL #7   Title Pt will be mod I with simple hot meal prep   Status On-going     OT LONG TERM GOAL #8   Title Pt will tolerate at least 25 minutes of activity without rest breaks.    Status On-going               Plan - 08/23/16 1251    Clinical Impression Statement Pt progressing toward goals. Pt with significant pain in several  joints that may impact goals.    Rehab Potential Good   Clinical Impairments  Affecting Rehab Potential premorbid joint pain, lupus   OT Frequency 2x / week   OT Duration 8 weeks   OT Treatment/Interventions Self-care/ADL training;Electrical Stimulation;Moist Heat;Ultrasound;Therapeutic exercise;Neuromuscular education;Energy  conservation;DME and/or AE instruction;Manual Therapy;Splinting;Therapeutic activities;Visual/perceptual remediation/compensation;Patient/family education   Plan complete splint, provide hand out, NMR for RUE, consider estim??   Consulted and Agree with Plan of Care Patient      Patient will benefit from skilled therapeutic intervention in order to improve the following deficits and impairments:  Decreased activity tolerance, Decreased coordination, Decreased range of motion, Decreased knowledge of use of DME, Decreased strength, Impaired UE functional use, Impaired tone, Impaired sensation, Impaired vision/preception, Pain  Visit Diagnosis: Hemiplegia and hemiparesis following cerebral infarction affecting right dominant side ([facility]) - Plan: Ot plan of care cert/re-cert  Apraxia - Plan: Ot plan of care cert/re-cert  Acute pain of right shoulder - Plan: Ot plan of care cert/re-cert  Muscle weakness (generalized) - Plan: Ot plan of care cert/re-cert  Other symptoms and signs involving the nervous system - Plan: Ot plan of care cert/re-cert  Visual spatial disorder - Plan: Ot plan of care cert/re-cert    Problem List Patient Active Problem List   Diagnosis Date Noted  . Aphasia as late effect of stroke 08/01/2016  . Spasticity   . Frontal lobe deficit 07/25/2016  . Right hemiparesis ([facility])   . Dysarthria, post-stroke   . Benign essential HTN   . Pure hypercholesterolemia   . Tobacco abuse   . AKI (acute kidney injury) ([facility])   . Paroxysmal atrial fibrillation (H[facility])   . Cocaine abuse   . Hyperlipidemia   . Stroke ([facility]) 07/20/2016  . Stroke (cerebrum) ([facility]) - L frontal embolic s/p IV tPA, d/t AF 07/20/2016  . Alteration in neurological status   . Postoperative anemia due to acute blood loss 06/05/2015  . Hip fracture, right ([facility])   . Fracture of hip, right, closed ([facility]) 06/03/2015  . Fall 06/03/2015  . Leukocytosis 06/03/2015  . Lupus   . Smoker   . Sciatica of right side      Quay Burow, OTR/L 08/23/2016, 12:56 PM  [facility] [address] [place], [place], [address] Phone: [phone]   Fax:  [phone]  Name: Andrea Kennedy MRN: [id number] Date of Birth: 04-24-1946

## 2016-08-24 ENCOUNTER — Ambulatory Visit: Payer: Medicare HMO | Admitting: Family

## 2016-08-25 ENCOUNTER — Encounter: Payer: Self-pay | Admitting: Occupational Therapy

## 2016-08-25 ENCOUNTER — Ambulatory Visit: Payer: Medicare HMO | Admitting: Occupational Therapy

## 2016-08-25 ENCOUNTER — Ambulatory Visit: Payer: Medicare HMO | Admitting: Speech Pathology

## 2016-08-25 ENCOUNTER — Ambulatory Visit: Payer: Medicare HMO | Admitting: Physical Therapy

## 2016-08-25 DIAGNOSIS — R2689 Other abnormalities of gait and mobility: Secondary | ICD-10-CM | POA: Diagnosis not present

## 2016-08-25 DIAGNOSIS — R482 Apraxia: Secondary | ICD-10-CM

## 2016-08-25 DIAGNOSIS — R4701 Aphasia: Secondary | ICD-10-CM

## 2016-08-25 DIAGNOSIS — M25511 Pain in right shoulder: Secondary | ICD-10-CM

## 2016-08-25 DIAGNOSIS — H538 Other visual disturbances: Secondary | ICD-10-CM

## 2016-08-25 DIAGNOSIS — I69351 Hemiplegia and hemiparesis following cerebral infarction affecting right dominant side: Secondary | ICD-10-CM

## 2016-08-25 DIAGNOSIS — R29818 Other symptoms and signs involving the nervous system: Secondary | ICD-10-CM

## 2016-08-25 DIAGNOSIS — R471 Dysarthria and anarthria: Secondary | ICD-10-CM

## 2016-08-25 DIAGNOSIS — R41842 Visuospatial deficit: Secondary | ICD-10-CM

## 2016-08-25 DIAGNOSIS — M6281 Muscle weakness (generalized): Secondary | ICD-10-CM

## 2016-08-25 NOTE — Therapy (Signed)
[facility] [address] [place], [place], [address] Phone: [phone]   Fax:  [phone]  Speech Language Pathology Treatment  Patient Details  Name: Andrea Kennedy MRN: [id number] Date of Birth: [birth year] Referring Provider: Alysia Penna MD  Encounter Date: 08/25/2016      End of Session - 08/25/16 1630    Visit Number 2   Number of Visits 17   Date for SLP Re-Evaluation 10/28/16   SLP Start Time 2778   SLP Stop Time  1529   SLP Time Calculation (min) 44 min   Activity Tolerance Patient tolerated treatment well      Past Medical History:  Diagnosis Date  . Lupus   . Sciatica of right side   . Tobacco abuse     Past Surgical History:  Procedure Laterality Date  . TOTAL HIP ARTHROPLASTY Right 06/03/2015   Procedure: RIGHT TOTAL HIP ARTHROPLASTY ANTERIOR APPROACH;  Surgeon: Gaynelle Arabian, MD;  Location: [facility];  Service: Orthopedics;  Laterality: Right;  . TUBAL LIGATION      There were no vitals filed for this visit.      Subjective Assessment - 08/25/16 1446    Subjective "I'm worn out!"                ADULT SLP TREATMENT - 08/25/16 0001      Pain Assessment   Pain Assessment 0-10   Pain Score 5    Pain Location headache, shoulders   Pain Descriptors / Indicators Aching   Pain Intervention(s) Monitored during session     Cognitive-Linquistic Treatment   Treatment focused on Dysarthria   Skilled Treatment Provided education, training and skilled feedback in use of dysarthria strategies. At word level, patient demonstrates 95% intelligibility. Benefits from written cue and syllable segmentation to improve pronunication of multisyllabic words. Patient achieved 80% intelligibility in sentence-level tasks, with min verbal cues for overarticulation of final consonants. Self-monitors and is observed to correct pronunciation x2 during sentence level task. With increased cognitive load,  patient maintains slow rate and use of overarticulation with min A. During conversation, patient reported vocal fatigue. Provided education regarding postural adjustments to improve breath support for efficient speech.       Assessment / Recommendations / Plan   Plan Continue with current plan of care     Progression Toward Goals   Progression toward goals Progressing toward goals          SLP Education - 08/25/16 1629    Education provided Yes   Education Details compensatory strategies for dysarthria, breath support/vocal effort   Person(s) Educated Patient   Methods Explanation;Demonstration;Verbal cues   Comprehension Verbalized understanding;Returned demonstration          SLP Short Term Goals - 08/25/16 1649      SLP SHORT TERM GOAL #1   Title pt will perform dysarthria HEP with rare min A over three sessions   Time 3   Period Weeks   Status On-going     SLP SHORT TERM GOAL #2   Title pt will demo compensations for apraxia/dysarthria in 5 minutes simple conversation to achieve 100% intelligibility   Time 3   Status On-going          SLP Long Term Goals - 08/25/16 1651      SLP LONG TERM GOAL #1   Title pt will perform dysarthria HEP with modified independence over three sessions   Time 7   Period Weeks   Status On-going  SLP LONG TERM GOAL #2   Title pt will participate in 15 minutes mod complex conversation using compensations for dysarthria/apraxia/aphasia, in order to achieve 100% intelligibility over 3 sessions   Time 7   Period Weeks   Status On-going          Plan - 08/25/16 1633    Clinical Impression Statement Patient did not return with her home exercises, however she states she has been completing at home in addition to reading aloud. Patient verbalizes understanding of compensatory strategies to improve intelligibility. At the word level, she demonstrates good technique in use of overarticulation to improve pronunciation. As length of  utterance increases, patient requires min-mod verbal cues and benefits from demonstration to reduce rate and use overarticulation strategy. She demonstrates good awareness of errors but continues to require occasional verbal cues to correct unintelligible utterances. She will benefit from continued skilled ST services to improve functional deficits in intelligibility and improve quality of life.   Speech Therapy Frequency 2x / week   Treatment/Interventions Compensatory techniques;Internal/external aids;SLP instruction and feedback;Language facilitation;Multimodal communcation approach;Patient/family education;Functional tasks;Cueing hierarchy;Oral motor exercises;Compensatory strategies   Potential to Achieve Goals Good   Potential Considerations Severity of impairments   SLP Home Exercise Plan homework assigned   Consulted and Agree with Plan of Care Patient      Patient will benefit from skilled therapeutic intervention in order to improve the following deficits and impairments:   Apraxia  Dysarthria and anarthria  Aphasia    Problem List Patient Active Problem List   Diagnosis Date Noted  . Aphasia as late effect of stroke 08/01/2016  . Spasticity   . Frontal lobe deficit 07/25/2016  . Right hemiparesis ([facility])   . Dysarthria, post-stroke   . Benign essential HTN   . Pure hypercholesterolemia   . Tobacco abuse   . AKI (acute kidney injury) ([facility])   . Paroxysmal atrial fibrillation ([facility])   . Cocaine abuse   . Hyperlipidemia   . Stroke ([facility]) 07/20/2016  . Stroke (cerebrum) ([facility]) - L frontal embolic s/p IV tPA, d/t AF 07/20/2016  . Alteration in neurological status   . Postoperative anemia due to acute blood loss 06/05/2015  . Hip fracture, right ([facility])   . Fracture of hip, right, closed ([facility]) 06/03/2015  . Fall 06/03/2015  . Leukocytosis 06/03/2015  . Lupus   . Smoker   . Sciatica of right side    Deneise Lever, MS CF-SLP Speech-Language Pathologist  Aliene Altes 08/25/2016, 4:52 PM  [facility] [address] [place], [place], [address] Phone: [phone]   Fax:  [phone]   Name: Andrea Kennedy MRN: [id number] Date of Birth: [birth year]

## 2016-08-25 NOTE — Therapy (Signed)
[facility] [address] [place], [place], [address] Phone: [phone]   Fax:  [phone]  Occupational Therapy Treatment  Patient Details  Name: Andrea Kennedy MRN: [id number] Date of Birth: 10-Sep-1945 Referring Provider: Dr.  Naaman Plummer  Encounter Date: 08/25/2016      OT End of Session - 08/25/16 1459    Visit Number 3   Number of Visits 16   Date for OT Re-Evaluation 10/06/16   Authorization Type [employer] medicare - await info regarding any visit limitations  Will need G code and PN every 10th visit   Authorization - Visit Number 3   Authorization - Number of Visits 10   OT Start Time 1315   OT Stop Time 1400   OT Time Calculation (min) 45 min   Activity Tolerance Patient tolerated treatment well      Past Medical History:  Diagnosis Date  . Lupus   . Sciatica of right side   . Tobacco abuse     Past Surgical History:  Procedure Laterality Date  . TOTAL HIP ARTHROPLASTY Right 06/03/2015   Procedure: RIGHT TOTAL HIP ARTHROPLASTY ANTERIOR APPROACH;  Surgeon: Gaynelle Arabian, MD;  Location: [facility];  Service: Orthopedics;  Laterality: Right;  . TUBAL LIGATION      There were no vitals filed for this visit.      Subjective Assessment - 08/25/16 1322    Subjective  I don't have much pain at all today - I feel better today   Pertinent History see epic pt with L CVA   Patient Stated Goals be able to talk better and take care of yourself.   Currently in Pain? No/denies                      OT Treatments/Exercises (OP) - 08/25/16 1449      ADLs   ADL Comments Reivewed bed positioing for RUE for shoulder pain and to prevent edema in R hand.  Pt able to verbalize understanding.  Discussed protecting the shoulder during bed mobility as well.  Pt verbalized understanding of info.       Splinting   Splinting Completed resting hand splint;  pt able to don and doff splint independently.  Reviewed wear  and care for splint as well as purpose. Pt able to verbalize understanding and is aware of what to monitor.  Pt states splint is "very comfortable."                  OT Education - 08/25/16 1457    Education provided Yes   Education Details splint wear and care, positioning for RUE   Person(s) Educated Patient   Methods Explanation;Demonstration;Verbal cues;Handout   Comprehension Verbalized understanding;Returned demonstration          OT Short Term Goals - 08/25/16 1457      OT SHORT TERM GOAL #1   Title Pt will be mod I with HEP- 09/08/2016   Status On-going     OT SHORT TERM GOAL #2   Title Pt will demonstrate ability to orient R hand for grasp with no more than min facilitation in prep for functional hand use.    Status On-going     OT SHORT TERM GOAL #3   Title Pt will demonstrate ability for active grasp with cylindrical object with min facilitation   Status On-going     OT SHORT TERM GOAL #4   Title Pt will report no more than 3/5 pain  in R shoulder with shoulder flexion to 120* for overhead reach   Status On-going     OT SHORT TERM GOAL #5   Title Pt will be min a for simple familiar hot meal prep at ambulatory level.    Status On-going     OT SHORT TERM GOAL #6   Title Pt will be mod I with zipping and cutting AE prn   Status On-going     OT SHORT TERM GOAL #7   Title Pt will demonstate understanding of splint wear and care.    Status Achieved           OT Long Term Goals - 08/25/16 1457      OT LONG TERM GOAL #1   Title Pt will be mod I with upgraded HEP - 10/06/2016   Status On-going     OT LONG TERM GOAL #2   Title Pt will demonstrate abilty to release cylindrical object with min faciltiation   Status On-going     OT LONG TERM GOAL #3   Title Pt will demonstrate ability to grasp cylindrical object mod I   Status On-going     OT LONG TERM GOAL #4   Title Pt will demonstrate ability to use RUE as gross assist during basic ADL tasks,  simple IADl tasks at least 50% of the time.   Status On-going     OT LONG TERM GOAL #5   Title Pt will complete visual environmental scanning activity with at least 95% accuracy   Status On-going     OT LONG TERM GOAL #6   Title Pt will be able to write full name legibly with non dominant hand   Status On-going     OT LONG TERM GOAL #7   Title Pt will be mod I with simple hot meal prep   Status On-going     OT LONG TERM GOAL #8   Title Pt will tolerate at least 25 minutes of activity without rest breaks.    Status On-going               Plan - 08/25/16 1458    Clinical Impression Statement Pt progressing toward goals. Pt demonstrates understanding of purpose, wear and care of splint.   Rehab Potential Good   Clinical Impairments Affecting Rehab Potential premorbid joint pain, lupus   OT Frequency 2x / week   OT Duration 8 weeks   OT Treatment/Interventions Self-care/ADL training;Electrical Stimulation;Moist Heat;Ultrasound;Therapeutic exercise;Neuromuscular education;Energy conservation;DME and/or AE instruction;Manual Therapy;Splinting;Therapeutic activities;Visual/perceptual remediation/compensation;Patient/family education   Plan check splint, NMR for RUE, consider estim??, work on beginning Electrical engineer with Plan of Care Patient      Patient will benefit from skilled therapeutic intervention in order to improve the following deficits and impairments:  Decreased activity tolerance, Decreased coordination, Decreased range of motion, Decreased knowledge of use of DME, Decreased strength, Impaired UE functional use, Impaired tone, Impaired sensation, Impaired vision/preception, Pain  Visit Diagnosis: Hemiplegia and hemiparesis following cerebral infarction affecting right dominant side ([facility])  Apraxia  Acute pain of right shoulder  Muscle weakness (generalized)  Other symptoms and signs involving the nervous system  Visual spatial  disorder    Problem List Patient Active Problem List   Diagnosis Date Noted  . Aphasia as late effect of stroke 08/01/2016  . Spasticity   . Frontal lobe deficit 07/25/2016  . Right hemiparesis ([facility])   . Dysarthria, post-stroke   . Benign essential HTN   . Pure  hypercholesterolemia   . Tobacco abuse   . AKI (acute kidney injury) ([facility])   . Paroxysmal atrial fibrillation ([facility])   . Cocaine abuse   . Hyperlipidemia   . Stroke ([facility]) 07/20/2016  . Stroke (cerebrum) ([facility]) - L frontal embolic s/p IV tPA, d/t AF 07/20/2016  . Alteration in neurological status   . Postoperative anemia due to acute blood loss 06/05/2015  . Hip fracture, right ([facility])   . Fracture of hip, right, closed ([facility]) 06/03/2015  . Fall 06/03/2015  . Leukocytosis 06/03/2015  . Lupus   . Smoker   . Sciatica of right side     Quay Burow, OTR/L 08/25/2016, 3:00 PM  [facility] [address] [place], [place], [address] Phone: [phone]   Fax:  [phone]  Name: Andrea Kennedy MRN: [id number] Date of Birth: 1946/04/13

## 2016-08-25 NOTE — Therapy (Signed)
[facility] [address] [place], [place], [address] Phone: [phone]   Fax:  [phone]  Physical Therapy Treatment  Patient Details  Name: Andrea Kennedy MRN: [id number] Date of Birth: 04/29/1946 Referring Provider: Dr. Letta Pate  Encounter Date: 08/25/2016      PT End of Session - 08/25/16 1433    Visit Number 3   Number of Visits 9   Date for PT Re-Evaluation 09/10/16   Authorization Type [employer]: email sent to Lattie Haw to verify insurance.   PT Start Time 1400   PT Stop Time 1438   PT Time Calculation (min) 38 min   Equipment Utilized During Treatment Gait belt   Activity Tolerance Patient tolerated treatment well   Behavior During Therapy WFL for tasks assessed/performed      Past Medical History:  Diagnosis Date  . Lupus   . Sciatica of right side   . Tobacco abuse     Past Surgical History:  Procedure Laterality Date  . TOTAL HIP ARTHROPLASTY Right 06/03/2015   Procedure: RIGHT TOTAL HIP ARTHROPLASTY ANTERIOR APPROACH;  Surgeon: Gaynelle Arabian, MD;  Location: [facility];  Service: Orthopedics;  Laterality: Right;  . TUBAL LIGATION      There were no vitals filed for this visit.      Subjective Assessment - 08/25/16 1400    Subjective feels better today than last visit   Pertinent History Hx of R THA, a-fib, Lupus, hx of cocaine abuse, benign essential HTN, hyperlipidemia   Patient Stated Goals "to do everything I could do before: work cleaning house, squat, bend"   Currently in Pain? No/denies                         [facility] Adult PT Treatment/Exercise - 08/25/16 1406      Ambulation/Gait   Stairs Yes   Stairs Assistance 5: Supervision   Stair Management Technique No rails;Forwards;Alternating pattern   Number of Stairs 8   Height of Stairs 6     Knee/Hip Exercises: Aerobic   Nustep L5 x 8 min; LE only     Knee/Hip Exercises: Machines for Strengthening   Cybex Leg  Press 50# 2x10; RLE 30# 2x10     Knee/Hip Exercises: Standing   Hip Flexion Stengthening;Right;Left;20 reps   Hip Flexion Limitations Alternating LE, bilat UE support   Hip Abduction Stengthening;Right;Left;20 reps   Abduction Limitations alternating LE, bilat UE support   Hip Extension Stengthening;Right;Left;20 reps;Knee straight   Extension Limitations alternating LE; bilat UE support   Lateral Step Up Both;10 reps;Hand Hold: 0;Step Height: 4";2 sets;Step Height: 6"   Forward Step Up Both;10 reps;Hand Hold: 0;Step Height: 4";2 sets;Step Height: 6"   Walking with Sports Cord sidestepping and backwards walking with red theraband 20'x2 each     Knee/Hip Exercises: Seated   Sit to Sand 10 reps;without UE support                  PT Short Term Goals - 08/11/16 1035      PT SHORT TERM GOAL #1   Title same as LTGs           PT Long Term Goals - 08/23/16 1228      PT LONG TERM GOAL #1   Title Pt will verbalize understanding of CVA risk factor and signs/sx's to reduce risk of additional CVA. TARGET DATE FOR ALL LTGS: 09/08/16   Status New     PT LONG TERM  GOAL #2   Title Pt will be IND in HEP to improve endurance, flexibility and balance.    Status New     PT LONG TERM GOAL #3   Title Pt will improve FGA score to >/=27/30 to reduce falls risk.    Status New     PT LONG TERM GOAL #4   Title Perform 6 MWT and write goal as indicated.    Status Achieved     PT LONG TERM GOAL #5   Title Pt will be able to perform squat and bending forward to pick item off floor without UE support, IND, in order to perform yard work.   Status New     Additional Long Term Goals   Additional Long Term Goals Yes     PT LONG TERM GOAL #6   Title Pt will improve overall endurance as indicated by improvement in 6 MWT by 150 ft   Baseline 1126 ft   Status New               Plan - 08/25/16 1434    Clinical Impression Statement Session today focused on review of HEP as well as  strengthening of lower extremities.  Pt independent with HEP and able to perform stairs without UE support and supervision.  Pt will continue to benefit from PT to address deficits.   PT Treatment/Interventions ADLs/Self Care Home Management;Biofeedback;Canalith Repostioning;Neuromuscular re-education;Cognitive remediation;Electrical Stimulation;Therapeutic activities;Therapeutic exercise;Balance training;Manual techniques;Functional mobility training;Stair training;Gait training;Orthotic Fit/Training;DME Instruction;Patient/family education;Vestibular   PT Next Visit Plan walking program (instructed pt to time what she's doing at home so we can build), balance and strengthening      Patient will benefit from skilled therapeutic intervention in order to improve the following deficits and impairments:  Abnormal gait, Decreased endurance, Decreased balance, Decreased mobility, Dizziness, Decreased cognition, Decreased safety awareness, Impaired flexibility, Decreased strength, Decreased knowledge of use of DME  Visit Diagnosis: Other abnormalities of gait and mobility  Hemiplegia and hemiparesis following cerebral infarction affecting right dominant side [facility])     Problem List Patient Active Problem List   Diagnosis Date Noted  . Aphasia as late effect of stroke 08/01/2016  . Spasticity   . Frontal lobe deficit 07/25/2016  . Right hemiparesis ([place])   . Dysarthria, post-stroke   . Benign essential HTN   . Pure hypercholesterolemia   . Tobacco abuse   . AKI (acute kidney injury) ([place])   . Paroxysmal atrial fibrillation (HCC)   . Cocaine abuse   . Hyperlipidemia   . Stroke ([place]) 07/20/2016  . Stroke (cerebrum) ([place]) - L frontal embolic s/p IV tPA, d/t AF 07/20/2016  . Alteration in neurological status   . Postoperative anemia due to acute blood loss 06/05/2015  . Hip fracture, right ([place])   . Fracture of hip, right, closed ([place]) 06/03/2015  . Fall 06/03/2015  . Leukocytosis  06/03/2015  . Lupus   . Smoker   . Sciatica of right side       Laureen Abrahams, PT, DPT 08/25/16 2:37 PM    [facility] [address] [place], [place], [address] Phone: [phone]   Fax:  [phone]  Name: Andrea Kennedy MRN: [id number] Date of Birth: 08/08/1946

## 2016-08-25 NOTE — Patient Instructions (Signed)
  Please complete the assigned speech therapy homework and return it to your next session.  

## 2016-08-25 NOTE — Patient Instructions (Signed)
Your Splint This splint should initially be fitted by a healthcare practitioner.  The healthcare practitioner is responsible for providing wearing instructions and precautions to the patient, other healthcare practitioners and care provider involved in the patient's care.  This splint was custom made for you. Please read the following instructions to learn about wearing and caring for your splint.  Precautions Should your splint cause any of the following problems, remove the splint immediately and contact your therapist/physician.  Swelling  Severe Pain  Pressure Areas  Stiffness  Numbness  Do not wear your splint while operating machinery unless it has been fabricated for that purpose.  When To Wear Your Splint Where your splint according to your therapist/physician instructions.  Day One;  2 hours in the morning, 2 hours in the evening Day Two:  If no problems, 4 hours in the morning, 4 hours in the evening Day Three: If no problems ONLY at night when sleeping.  As of Day three you will only wear the splint at night never during the day.   Care and Cleaning of Your Splint 1. Keep your splint away from open flames. 2. Your splint will lose its shape in temperatures over 135 degrees Farenheit, ( in car windows, near radiators, ovens or in hot water).  Never make any adjustments to your splint, if the splint needs adjusting remove it and make an appointment to see your therapist. 3. Your splint, including the cushion liner may be cleaned with soap and lukewarm water.  Do not immerse in hot water over 135 degrees Farenheit. 4. Straps may be washed with soap and water, but do not moisten the self-adhesive portion. 5. For ink or hard to remove spots use a scouring cleanser which contains chlorine.  Rinse the splint thoroughly after using chlorine cleanser.

## 2016-08-29 ENCOUNTER — Ambulatory Visit: Payer: Medicare HMO | Admitting: Occupational Therapy

## 2016-08-29 ENCOUNTER — Ambulatory Visit: Payer: Medicare HMO | Admitting: Physical Therapy

## 2016-08-30 ENCOUNTER — Ambulatory Visit: Payer: Medicare HMO | Admitting: Occupational Therapy

## 2016-08-30 ENCOUNTER — Ambulatory Visit: Payer: Medicare HMO | Admitting: Physical Therapy

## 2016-08-30 ENCOUNTER — Ambulatory Visit: Payer: Medicare HMO | Admitting: *Deleted

## 2016-09-05 ENCOUNTER — Ambulatory Visit: Payer: Medicare HMO | Admitting: Speech Pathology

## 2016-09-05 ENCOUNTER — Ambulatory Visit: Payer: Medicare HMO | Admitting: Occupational Therapy

## 2016-09-05 ENCOUNTER — Ambulatory Visit: Payer: Medicare HMO | Admitting: Physical Therapy

## 2016-09-05 ENCOUNTER — Encounter: Payer: Self-pay | Admitting: Occupational Therapy

## 2016-09-05 DIAGNOSIS — R29818 Other symptoms and signs involving the nervous system: Secondary | ICD-10-CM

## 2016-09-05 DIAGNOSIS — M6281 Muscle weakness (generalized): Secondary | ICD-10-CM

## 2016-09-05 DIAGNOSIS — R471 Dysarthria and anarthria: Secondary | ICD-10-CM

## 2016-09-05 DIAGNOSIS — R41842 Visuospatial deficit: Secondary | ICD-10-CM

## 2016-09-05 DIAGNOSIS — M25511 Pain in right shoulder: Secondary | ICD-10-CM

## 2016-09-05 DIAGNOSIS — R4701 Aphasia: Secondary | ICD-10-CM

## 2016-09-05 DIAGNOSIS — I69351 Hemiplegia and hemiparesis following cerebral infarction affecting right dominant side: Secondary | ICD-10-CM

## 2016-09-05 DIAGNOSIS — H538 Other visual disturbances: Secondary | ICD-10-CM

## 2016-09-05 DIAGNOSIS — R2689 Other abnormalities of gait and mobility: Secondary | ICD-10-CM | POA: Diagnosis not present

## 2016-09-05 DIAGNOSIS — R482 Apraxia: Secondary | ICD-10-CM

## 2016-09-05 NOTE — Therapy (Signed)
[facility] [address] [place], [place], [address] Phone: [phone]   Fax:  [phone]  Speech Language Pathology Treatment  Patient Details  Name: Andrea Kennedy MRN: [id number] Date of Birth: 03/03/1946 Referring Provider: Alysia Penna MD  Encounter Date: 09/05/2016      End of Session - 09/05/16 1809    Visit Number 3   Number of Visits 17   Date for SLP Re-Evaluation 10/28/16   SLP Start Time 3500   SLP Stop Time  9381   SLP Time Calculation (min) 47 min   Activity Tolerance Patient tolerated treatment well      Past Medical History:  Diagnosis Date  . Lupus   . Sciatica of right side   . Tobacco abuse     Past Surgical History:  Procedure Laterality Date  . TOTAL HIP ARTHROPLASTY Right 06/03/2015   Procedure: RIGHT TOTAL HIP ARTHROPLASTY ANTERIOR APPROACH;  Surgeon: Gaynelle Arabian, MD;  Location: [facility];  Service: Orthopedics;  Laterality: Right;  . TUBAL LIGATION      There were no vitals filed for this visit.      Subjective Assessment - 09/05/16 1448    Subjective "If I get upset, I get to talking too fast"   Pain Score 6    Pain Location Shoulder   Pain Orientation Right   Pain Descriptors / Indicators Aching;Sore   Pain Type Chronic pain   Pain Radiating Towards shoulder   Pain Onset More than a month ago   Pain Frequency Constant   Pain Relieving Factors tylenol               ADULT SLP TREATMENT - 09/05/16 0001      General Information   Behavior/Cognition Alert;Cooperative;Pleasant mood   Patient Positioning Upright in chair     Treatment Provided   Treatment provided Cognitive-Linquistic     Cognitive-Linquistic Treatment   Treatment focused on Dysarthria   Skilled Treatment Training and skilled feedback in use of dysarthria/apraxia strategies focusing on conversation and multisyllabic words. Multisyllabic words 85% accuracy, improves to 100% with min-moderate  cues to utilize slow rate, overarticulation, relaxation. Conversation level 80% intelligible, self-corrects frequently but continues to require min-mod cues.     Assessment / Recommendations / Plan   Plan Continue with current plan of care     Progression Toward Goals   Progression toward goals Progressing toward goals          SLP Education - 09/05/16 1809    Education provided Yes   Education Details dysarthria strategies, reducing time pressure   Person(s) Educated Patient   Methods Explanation;Demonstration;Verbal cues   Comprehension Verbalized understanding;Returned demonstration          SLP Short Term Goals - 09/05/16 1820      SLP SHORT TERM GOAL #1   Title pt will perform dysarthria HEP with rare min A over three sessions   Time 2   Period Weeks   Status On-going     SLP SHORT TERM GOAL #2   Title pt will demo compensations for apraxia/dysarthria in 5 minutes simple conversation to achieve 100% intelligibility   Time 2   Period Weeks   Status On-going          SLP Long Term Goals - 09/05/16 1821      SLP LONG TERM GOAL #1   Title pt will perform dysarthria HEP with modified independence over three sessions   Time 6   Period Weeks  Status On-going     SLP LONG TERM GOAL #2   Title pt will participate in 15 minutes mod complex conversation using compensations for dysarthria/apraxia/aphasia, in order to achieve 100% intelligibility over 3 sessions   Time 6   Period Weeks   Status On-going          Plan - 09/05/16 1814    Clinical Impression Statement Patient reports completion of home exercises and appears highly motivated to improve speech. She utilitzed compensatory strategies to improve intelligibility with min-moderate verbal cues for pacing, articulation, during functional conversation regarding her concerns about changes in attention and "organizing" she has noted since her stroke. She may benefit from further evaluation in cognitive  communication areas. She will benefit from continued skilled ST services to improve functional deficits in intelligibility and to improve quality of life.    Speech Therapy Frequency 2x / week   Duration Other (comment)   Treatment/Interventions Compensatory techniques;Internal/external aids;SLP instruction and feedback;Language facilitation;Multimodal communcation approach;Patient/family education;Functional tasks;Cueing hierarchy;Oral motor exercises;Compensatory strategies   Potential to Achieve Goals Good   Potential Considerations Severity of impairments   SLP Home Exercise Plan homework assigned   Consulted and Agree with Plan of Care Patient      Patient will benefit from skilled therapeutic intervention in order to improve the following deficits and impairments:   Hemiplegia and hemiparesis following cerebral infarction affecting right dominant side (HCC)  Apraxia  Dysarthria and anarthria  Aphasia    Problem List Patient Active Problem List   Diagnosis Date Noted  . Aphasia as late effect of stroke 08/01/2016  . Spasticity   . Frontal lobe deficit 07/25/2016  . Right hemiparesis ([place])   . Dysarthria, post-stroke   . Benign essential HTN   . Pure hypercholesterolemia   . Tobacco abuse   . AKI (acute kidney injury) ([place])   . Paroxysmal atrial fibrillation (HCC)   . Cocaine abuse   . Hyperlipidemia   . Stroke ([place]) 07/20/2016  . Stroke (cerebrum) ([place]) - L frontal embolic s/p IV tPA, d/t AF 07/20/2016  . Alteration in neurological status   . Postoperative anemia due to acute blood loss 06/05/2015  . Hip fracture, right ([place])   . Fracture of hip, right, closed ([place]) 06/03/2015  . Fall 06/03/2015  . Leukocytosis 06/03/2015  . Lupus   . Smoker   . Sciatica of right side    Deneise Lever, MS CF-SLP Speech-Language Pathologist  Aliene Altes 09/05/2016, 6:22 PM  [facility] [address] [place], [place], [address] Phone: [phone]   Fax:  [phone]   Name: Shameka Aggarwal MRN: [id number] Date of Birth: 1946-05-29

## 2016-09-05 NOTE — Therapy (Signed)
[facility] [address] [place], [place], [address] Phone: [phone]   Fax:  [phone]  Occupational Therapy Treatment  Patient Details  Name: Andrea Kennedy MRN: [id number] Date of Birth: [birth year] Referring Provider: Dr.  Naaman Plummer  Encounter Date: 09/05/2016      OT End of Session - 09/05/16 1642    Visit Number 4   Number of Visits 16   Date for OT Re-Evaluation 10/06/16   Authorization Type [employer] medicare - await info regarding any visit limitations  Will need G code and PN every 10th visit   Authorization - Visit Number 4   Authorization - Number of Visits 10   OT Start Time 1884   OT Stop Time 1618   OT Time Calculation (min) 46 min      Past Medical History:  Diagnosis Date  . Lupus   . Sciatica of right side   . Tobacco abuse     Past Surgical History:  Procedure Laterality Date  . TOTAL HIP ARTHROPLASTY Right 06/03/2015   Procedure: RIGHT TOTAL HIP ARTHROPLASTY ANTERIOR APPROACH;  Surgeon: Gaynelle Arabian, MD;  Location: [facility];  Service: Orthopedics;  Laterality: Right;  . TUBAL LIGATION      There were no vitals filed for this visit.      Subjective Assessment - 09/05/16 1535    Subjective  I just have a little bit of soreness in my legs from working out.    Pertinent History see epic pt with L CVA   Patient Stated Goals be able to talk better and take care of yourself.   Currently in Pain? No/denies                      OT Treatments/Exercises (OP) - 09/05/16 0001      Neurological Re-education Exercises   Other Exercises 1 Neuro re ed to address hand orientation, grasp and release.  Pt with gross grasp for cylndrical object and min facilitation for release - pt has more isolated movement in middle,ring and small finger than index or thumb.  Also addressed 3 point pinch and attempted 2 point pinch.  Pt needs mod a for 2 point pinch. ALso addressed hand orientation - pt  needs min cues to attend but physically is able to orient hand appropriately.  Incorporated reach into activity to facilitate finger extension.     Modalities   Modalities Social worker Location R hand for finger extension   Electrical Stimulation Action 50 pps, 250wp at 12 intensity -tolerated well .  Pt with improved carry over immediately after estim into functional task.              Balance Exercises - 09/05/16 1448      Balance Exercises: Standing   Tandem Gait Forward;Retro;2 reps  20'   Marching Limitations marching with and without knee extension forwards/backwards with supervision   Other Standing Exercises SLS activities with minguard A: single tap/double tap/kicking over, uprighting cones             OT Short Term Goals - 09/05/16 1641      OT SHORT TERM GOAL #1   Title Pt will be mod I with HEP- 09/08/2016   Status On-going     OT SHORT TERM GOAL #2   Title Pt will demonstrate ability to orient R hand for grasp with no more than min facilitation in prep for functional  hand use.    Status Achieved     OT SHORT TERM GOAL #3   Title Pt will demonstrate ability for active grasp with cylindrical object with min facilitation   Status Achieved     OT SHORT TERM GOAL #4   Title Pt will report no more than 3/5 pain in R shoulder with shoulder flexion to 120* for overhead reach   Status On-going     OT SHORT TERM GOAL #5   Title Pt will be min a for simple familiar hot meal prep at ambulatory level.    Status On-going     OT SHORT TERM GOAL #6   Title Pt will be mod I with zipping and cutting AE prn   Status On-going     OT SHORT TERM GOAL #7   Title Pt will demonstate understanding of splint wear and care.    Status Achieved           OT Long Term Goals - 09/05/16 1641      OT LONG TERM GOAL #1   Title Pt will be mod I with upgraded HEP - 10/06/2016   Status On-going     OT LONG TERM  GOAL #2   Title Pt will demonstrate abilty to release cylindrical object with min faciltiation   Status On-going     OT LONG TERM GOAL #3   Title Pt will demonstrate ability to grasp cylindrical object mod I   Status On-going     OT LONG TERM GOAL #4   Title Pt will demonstrate ability to use RUE as gross assist during basic ADL tasks, simple IADl tasks at least 50% of the time.   Status On-going     OT LONG TERM GOAL #5   Title Pt will complete visual environmental scanning activity with at least 95% accuracy   Status On-going     OT LONG TERM GOAL #6   Title Pt will be able to write full name legibly with non dominant hand   Status On-going     OT LONG TERM GOAL #7   Title Pt will be mod I with simple hot meal prep   Status On-going     OT LONG TERM GOAL #8   Title Pt will tolerate at least 25 minutes of activity without rest breaks.    Status On-going               Plan - 09/05/16 1641    Clinical Impression Statement Pt progressing toward goals. Pt now has active wrist extension/flexion and gross grasp and release.    Rehab Potential Good   Clinical Impairments Affecting Rehab Potential premorbid joint pain, lupus   OT Frequency 2x / week   OT Duration 8 weeks   OT Treatment/Interventions Self-care/ADL training;Electrical Stimulation;Moist Heat;Ultrasound;Therapeutic exercise;Neuromuscular education;Energy conservation;DME and/or AE instruction;Manual Therapy;Splinting;Therapeutic activities;Visual/perceptual remediation/compensation;Patient/family education   Plan NMR for RUE, estim for hand function, functional use of RUE, cooking activity, check remaining STG's   Consulted and Agree with Plan of Care Patient      Patient will benefit from skilled therapeutic intervention in order to improve the following deficits and impairments:  Decreased activity tolerance, Decreased coordination, Decreased range of motion, Decreased knowledge of use of DME, Decreased  strength, Impaired UE functional use, Impaired tone, Impaired sensation, Impaired vision/preception, Pain  Visit Diagnosis: Hemiplegia and hemiparesis following cerebral infarction affecting right dominant side (HCC)  Apraxia  Acute pain of right shoulder  Muscle weakness (generalized)  Other symptoms  and signs involving the nervous system  Visual spatial disorder    Problem List Patient Active Problem List   Diagnosis Date Noted  . Aphasia as late effect of stroke 08/01/2016  . Spasticity   . Frontal lobe deficit 07/25/2016  . Right hemiparesis ([place])   . Dysarthria, post-stroke   . Benign essential HTN   . Pure hypercholesterolemia   . Tobacco abuse   . AKI (acute kidney injury) ([place])   . Paroxysmal atrial fibrillation (HCC)   . Cocaine abuse   . Hyperlipidemia   . Stroke ([place]) 07/20/2016  . Stroke (cerebrum) ([place]) - L frontal embolic s/p IV tPA, d/t AF 07/20/2016  . Alteration in neurological status   . Postoperative anemia due to acute blood loss 06/05/2015  . Hip fracture, right ([place])   . Fracture of hip, right, closed ([place]) 06/03/2015  . Fall 06/03/2015  . Leukocytosis 06/03/2015  . Lupus   . Smoker   . Sciatica of right side     Quay Burow, OTR/L 09/05/2016, 4:44 PM  [facility] [address] [place], [place], [address] Phone: [phone]   Fax:  [phone]  Name: Deshonda Cryderman MRN: [id number] Date of Birth: 1946/04/06

## 2016-09-05 NOTE — Therapy (Addendum)
[facility] [address] [place], [place], [address] Phone: [phone]   Fax:  [phone]  Physical Therapy Treatment  Patient Details  Name: Andrea Kennedy MRN: [id number] Date of Birth: Dec 28, 1945 Referring Provider: Dr. Letta Pate  Encounter Date: 09/05/2016      PT End of Session - 09/05/16 1450    Visit Number 4   Number of Visits 9   Date for PT Re-Evaluation 09/16/16  extended 1 week due to missed week   Authorization Type [employer]: email sent to Lattie Haw to verify insurance.   PT Start Time 1400   PT Stop Time 1439   PT Time Calculation (min) 39 min   Equipment Utilized During Treatment Gait belt   Activity Tolerance Patient tolerated treatment well   Behavior During Therapy WFL for tasks assessed/performed      Past Medical History:  Diagnosis Date  . Lupus   . Sciatica of right side   . Tobacco abuse     Past Surgical History:  Procedure Laterality Date  . TOTAL HIP ARTHROPLASTY Right 06/03/2015   Procedure: RIGHT TOTAL HIP ARTHROPLASTY ANTERIOR APPROACH;  Surgeon: Gaynelle Arabian, MD;  Location: [facility];  Service: Orthopedics;  Laterality: Right;  . TUBAL LIGATION      There were no vitals filed for this visit.      Subjective Assessment - 09/05/16 1401    Subjective has done a lot of sitting due to weather and stomach bug   Pertinent History Hx of R THA, a-fib, Lupus, hx of cocaine abuse, benign essential HTN, hyperlipidemia   Patient Stated Goals "to do everything I could do before: work cleaning house, squat, bend"   Currently in Pain? No/denies                         [facility] Adult PT Treatment/Exercise - 09/05/16 1402      Ambulation/Gait   Ambulation/Gait Yes   Gait Comments amb outdoors x 15 min; O2 92% and HR 92 after amb     Self-Care   Self-Care Other Self-Care Comments   Other Self-Care Comments  educated on walking program and progression; pt verbalized  understanding             Balance Exercises - 09/05/16 1448      Balance Exercises: Standing   Tandem Gait Forward;Retro;2 reps  20'   Marching Limitations marching with and without knee extension forwards/backwards with supervision   Other Standing Exercises SLS activities with minguard A: single tap/double tap/kicking over, uprighting cones           PT Education - 09/05/16 1450    Education provided Yes   Education Details walking program   Person(s) Educated Patient   Methods Explanation;Demonstration;Handout   Comprehension Verbalized understanding          PT Short Term Goals - 08/11/16 1035      PT SHORT TERM GOAL #1   Title same as LTGs           PT Long Term Goals - 08/23/16 1228      PT LONG TERM GOAL #1   Title Pt will verbalize understanding of CVA risk factor and signs/sx's to reduce risk of additional CVA. TARGET DATE FOR ALL LTGS: 09/08/16   Status New     PT LONG TERM GOAL #2   Title Pt will be IND in HEP to improve endurance, flexibility and balance.    Status New  PT LONG TERM GOAL #3   Title Pt will improve FGA score to >/=27/30 to reduce falls risk.    Status New     PT LONG TERM GOAL #4   Title Perform 6 MWT and write goal as indicated.    Status Achieved     PT LONG TERM GOAL #5   Title Pt will be able to perform squat and bending forward to pick item off floor without UE support, IND, in order to perform yard work.   Status New     Additional Long Term Goals   Additional Long Term Goals Yes     PT LONG TERM GOAL #6   Title Pt will improve overall endurance as indicated by improvement in 6 MWT by 150 ft   Baseline 1126 ft   Status New               Plan - 09/05/16 1451    Clinical Impression Statement Session today focused on initiation of walking program followed by additional balance exercises.  Pt tolerated session well without difficulty.  Will continue to benefit from PT to address deficits.  Extended  goals 1 week due to missed week, but may not need extension as pt doing really well.   PT Treatment/Interventions ADLs/Self Care Home Management;Biofeedback;Canalith Repostioning;Neuromuscular re-education;Cognitive remediation;Electrical Stimulation;Therapeutic activities;Therapeutic exercise;Balance training;Manual techniques;Functional mobility training;Stair training;Gait training;Orthotic Fit/Training;DME Instruction;Patient/family education;Vestibular   PT Next Visit Plan balance and strengthening   Consulted and Agree with Plan of Care Patient      Patient will benefit from skilled therapeutic intervention in order to improve the following deficits and impairments:  Abnormal gait, Decreased endurance, Decreased balance, Decreased mobility, Dizziness, Decreased cognition, Decreased safety awareness, Impaired flexibility, Decreased strength, Decreased knowledge of use of DME  Visit Diagnosis: Other abnormalities of gait and mobility  Hemiplegia and hemiparesis following cerebral infarction affecting right dominant side [facility])     Problem List Patient Active Problem List   Diagnosis Date Noted  . Aphasia as late effect of stroke 08/01/2016  . Spasticity   . Frontal lobe deficit 07/25/2016  . Right hemiparesis ([place])   . Dysarthria, post-stroke   . Benign essential HTN   . Pure hypercholesterolemia   . Tobacco abuse   . AKI (acute kidney injury) ([place])   . Paroxysmal atrial fibrillation (HCC)   . Cocaine abuse   . Hyperlipidemia   . Stroke ([place]) 07/20/2016  . Stroke (cerebrum) ([place]) - L frontal embolic s/p IV tPA, d/t AF 07/20/2016  . Alteration in neurological status   . Postoperative anemia due to acute blood loss 06/05/2015  . Hip fracture, right ([place])   . Fracture of hip, right, closed ([place]) 06/03/2015  . Fall 06/03/2015  . Leukocytosis 06/03/2015  . Lupus   . Smoker   . Sciatica of right side        Laureen Abrahams, PT, DPT 09/05/16 2:53 PM    [facility] [address] [place], [place], [address] Phone: [phone]   Fax:  [phone]  Name: Dymin Dingledine MRN: [id number] Date of Birth: 1946/01/22      PHYSICAL THERAPY DISCHARGE SUMMARY  Visits from Start of Care: 4  Current functional level related to goals / functional outcomes: See above   Remaining deficits: unknown   Education / Equipment: HEP  Plan: Patient agrees to discharge.  Patient goals were not met. Patient is being discharged due to not returning since the last visit.  ?????  Laureen Abrahams, PT, DPT 02/02/17 8:37 AM    [facility] [address] [place], [place] [address]  [phone] (office) [phone] (fax)

## 2016-09-05 NOTE — Patient Instructions (Signed)
WALKING  Walking is a great form of exercise to increase your strength, endurance and overall fitness.  A walking program can help you start slowly and gradually build endurance as you go.  Everyone's ability is different, so each person's starting point will be different.  You do not have to follow them exactly.  The are just samples. You should simply find out what's right for you and stick to that program.   In the beginning, you'll start off walking 2-3 times a day for short distances.  As you get stronger, you'll be walking further at just 1-2 times per day.  A. You Can Walk For A Certain Length Of Time Each Day    Walk 15 minutes 2-3 times per day.  Increase 1-2 minutes every 2 days.  Work up to 25-30 minutes (1-2 times per day).   Please only do the exercises that your therapist has initialed and dated

## 2016-09-05 NOTE — Patient Instructions (Signed)
Homework as assigned

## 2016-09-07 ENCOUNTER — Ambulatory Visit: Payer: Medicare HMO | Admitting: Physical Therapy

## 2016-09-07 ENCOUNTER — Ambulatory Visit: Payer: Medicare HMO

## 2016-09-07 ENCOUNTER — Ambulatory Visit: Payer: Medicare HMO | Admitting: Occupational Therapy

## 2016-09-08 ENCOUNTER — Other Ambulatory Visit: Payer: Self-pay | Admitting: *Deleted

## 2016-09-08 MED ORDER — APIXABAN 5 MG PO TABS: 5.0000 mg | ORAL_TABLET | Freq: Two times a day (BID) | ORAL | 0 refills | Status: DC

## 2016-09-13 ENCOUNTER — Ambulatory Visit: Payer: Medicare HMO | Admitting: Family

## 2016-09-14 ENCOUNTER — Telehealth: Payer: Self-pay | Admitting: Physical Therapy

## 2016-09-14 ENCOUNTER — Ambulatory Visit: Payer: Medicare HMO | Admitting: Occupational Therapy

## 2016-09-14 ENCOUNTER — Ambulatory Visit: Payer: Medicare HMO | Admitting: Physical Therapy

## 2016-09-14 ENCOUNTER — Ambulatory Visit: Payer: Medicare HMO

## 2016-09-14 NOTE — Telephone Encounter (Signed)
Attempted to call pt about missed appointment.  No answer on phone number provided.

## 2016-09-16 ENCOUNTER — Encounter: Payer: Self-pay | Admitting: Physical Therapy

## 2016-09-16 ENCOUNTER — Ambulatory Visit: Payer: Medicare HMO | Attending: Physical Medicine & Rehabilitation | Admitting: Physical Therapy

## 2016-09-16 ENCOUNTER — Ambulatory Visit: Payer: Medicare HMO

## 2016-09-16 NOTE — Therapy (Signed)
[facility] [address] [place], [place], [address] Phone: [phone]   Fax:  [phone]  Patient Details  Name: Andrea Kennedy MRN: [id number] Date of Birth: 1945/09/21 Referring Provider:  No ref. provider found  Encounter Date: 09/16/2016  Pt did not show last scheduled visit for re-assessment of LTG.  Pt will need another visit to perform re-assessment of progress and to determine if pt has met goals and is ready for D/C or would benefit from re-certification of PT visits.     Raylene Everts [facility] 09/16/2016, 9:04 AM  [facility] [address] [place], [place], [address] Phone: [phone]   Fax:  [phone]

## 2016-09-20 ENCOUNTER — Ambulatory Visit: Payer: Medicare HMO

## 2016-09-20 ENCOUNTER — Ambulatory Visit: Payer: Medicare HMO | Admitting: Occupational Therapy

## 2016-09-20 ENCOUNTER — Encounter: Payer: Self-pay | Admitting: Occupational Therapy

## 2016-09-20 DIAGNOSIS — I69351 Hemiplegia and hemiparesis following cerebral infarction affecting right dominant side: Secondary | ICD-10-CM

## 2016-09-20 NOTE — Therapy (Signed)
[facility] [address] [place], [place], [address] Phone: [phone]   Fax:  [phone]  Occupational Therapy Treatment  Patient Details  Name: Andrea Kennedy MRN: [id number] Date of Birth: 04-23-1946 Referring Provider: Dr.  Naaman Plummer  Encounter Date: 09/20/2016    Past Medical History:  Diagnosis Date  . Lupus   . Sciatica of right side   . Tobacco abuse     Past Surgical History:  Procedure Laterality Date  . TOTAL HIP ARTHROPLASTY Right 06/03/2015   Procedure: RIGHT TOTAL HIP ARTHROPLASTY ANTERIOR APPROACH;  Surgeon: Gaynelle Arabian, MD;  Location: [facility];  Service: Orthopedics;  Laterality: Right;  . TUBAL LIGATION      There were no vitals filed for this visit.                              OT Short Term Goals - 09/20/16 1303      OT SHORT TERM GOAL #1   Title Pt will be mod I with HEP- 09/08/2016   Status On-going     OT SHORT TERM GOAL #2   Title Pt will demonstrate ability to orient R hand for grasp with no more than min facilitation in prep for functional hand use.    Status Achieved     OT SHORT TERM GOAL #3   Title Pt will demonstrate ability for active grasp with cylindrical object with min facilitation   Status Achieved     OT SHORT TERM GOAL #4   Title Pt will report no more than 3/5 pain in R shoulder with shoulder flexion to 120* for overhead reach   Status On-going     OT SHORT TERM GOAL #5   Title Pt will be min a for simple familiar hot meal prep at ambulatory level.    Status On-going     OT SHORT TERM GOAL #6   Title Pt will be mod I with zipping and cutting AE prn   Status On-going     OT SHORT TERM GOAL #7   Title Pt will demonstate understanding of splint wear and care.    Status Achieved           OT Long Term Goals - 09/20/16 1304      OT LONG TERM GOAL #1   Title Pt will be mod I with upgraded HEP - 10/06/2016   Status On-going     OT  LONG TERM GOAL #2   Title Pt will demonstrate abilty to release cylindrical object with min faciltiation   Status On-going     OT LONG TERM GOAL #3   Title Pt will demonstrate ability to grasp cylindrical object mod I   Status On-going     OT LONG TERM GOAL #4   Title Pt will demonstrate ability to use RUE as gross assist during basic ADL tasks, simple IADl tasks at least 50% of the time.   Status On-going     OT LONG TERM GOAL #5   Title Pt will complete visual environmental scanning activity with at least 95% accuracy   Status On-going     OT LONG TERM GOAL #6   Title Pt will be able to write full name legibly with non dominant hand   Status On-going     OT LONG TERM GOAL #7   Title Pt will be mod I with simple hot meal prep   Status On-going  OT LONG TERM GOAL #8   Title Pt will tolerate at least 25 minutes of activity without rest breaks.    Status On-going             Patient will benefit from skilled therapeutic intervention in order to improve the following deficits and impairments:     Visit Diagnosis: Hemiplegia and hemiparesis following cerebral infarction affecting right dominant side (HCC)    Problem List Patient Active Problem List   Diagnosis Date Noted  . Aphasia as late effect of stroke 08/01/2016  . Spasticity   . Frontal lobe deficit 07/25/2016  . Right hemiparesis (HCC)   . Dysarthria, post-stroke   . Benign essential HTN   . Pure hypercholesterolemia   . Tobacco abuse   . AKI (acute kidney injury) (HCC)   . Paroxysmal atrial fibrillation (HCC)   . Cocaine abuse   . Hyperlipidemia   . Stroke (HCC) 07/20/2016  . Stroke (cerebrum) (HCC) - L frontal embolic s/p IV tPA, d/t AF 07/20/2016  . Alteration in neurological status   . Postoperative anemia due to acute blood loss 06/05/2015  . Hip fracture, right (HCC)   . Fracture of hip, right, closed (HCC) 06/03/2015  . Fall 06/03/2015  . Leukocytosis 06/03/2015  . Lupus   . Smoker   .  Sciatica of right side    Pt has missed several appts - called pt today and pt reported that she is having several health issues and was too sick to come but is doing her exercises at home when she can. Discussed with pt addressing health issues for now and discharging from therapies until she feels better. Pt in agreement and understands that she will need a new order for OT/PT for evaluation if she chooses to return.    OCCUPATIONAL THERAPY DISCHARGE SUMMARY  Visits from Start of Care: 4 Current functional level related to goals / functional outcomes: See above   Remaining deficits: See eval as pt only attended eval and 3 treatment sessions   Education / Equipment: HEP Plan: Patient agrees to discharge.  Patient goals were not met. Patient is being discharged due to not returning since the last visit.  ?????      [place], Karen Halliday, OTR/L 09/20/2016, 1:04 PM  [facility] [address] [place], [place], [address] Phone: [phone]   Fax:  [phone]  Name: Andrea Kennedy MRN: [id number] Date of Birth: 04/13/1946 

## 2016-09-22 ENCOUNTER — Ambulatory Visit: Payer: Medicare HMO | Admitting: Speech Pathology

## 2016-09-22 ENCOUNTER — Ambulatory Visit: Payer: Medicare HMO | Admitting: Occupational Therapy

## 2016-09-26 ENCOUNTER — Encounter: Payer: Medicare HMO | Admitting: Speech Pathology

## 2016-09-26 ENCOUNTER — Encounter: Payer: Medicare HMO | Admitting: Occupational Therapy

## 2016-09-27 ENCOUNTER — Encounter: Payer: Medicare HMO | Admitting: Occupational Therapy

## 2016-10-04 ENCOUNTER — Encounter: Payer: Medicare HMO | Admitting: Occupational Therapy

## 2016-10-06 ENCOUNTER — Encounter: Payer: Medicare HMO | Admitting: Occupational Therapy

## 2016-10-06 ENCOUNTER — Encounter: Payer: Medicare HMO | Admitting: Speech Pathology

## 2016-10-11 ENCOUNTER — Encounter: Payer: Self-pay | Admitting: Family

## 2016-10-11 ENCOUNTER — Encounter: Payer: Medicare HMO | Admitting: Occupational Therapy

## 2016-10-11 ENCOUNTER — Ambulatory Visit (INDEPENDENT_AMBULATORY_CARE_PROVIDER_SITE_OTHER): Payer: Medicare HMO | Admitting: Family

## 2016-10-11 VITALS — BP 132/88 | HR 107 | Temp 98.4°F | Resp 16 | Ht 68.0 in | Wt 127.0 lb

## 2016-10-11 DIAGNOSIS — E7800 Pure hypercholesterolemia, unspecified: Secondary | ICD-10-CM | POA: Diagnosis not present

## 2016-10-11 DIAGNOSIS — F17200 Nicotine dependence, unspecified, uncomplicated: Secondary | ICD-10-CM

## 2016-10-11 DIAGNOSIS — I10 Essential (primary) hypertension: Secondary | ICD-10-CM | POA: Diagnosis not present

## 2016-10-11 DIAGNOSIS — G8191 Hemiplegia, unspecified affecting right dominant side: Secondary | ICD-10-CM | POA: Diagnosis not present

## 2016-10-11 DIAGNOSIS — I63032 Cerebral infarction due to thrombosis of left carotid artery: Secondary | ICD-10-CM

## 2016-10-11 DIAGNOSIS — Z720 Tobacco use: Secondary | ICD-10-CM | POA: Diagnosis not present

## 2016-10-11 DIAGNOSIS — I480 Paroxysmal atrial fibrillation: Secondary | ICD-10-CM | POA: Diagnosis not present

## 2016-10-11 NOTE — Patient Instructions (Addendum)
Thank you for choosing [facility].  SUMMARY AND INSTRUCTIONS:  You are doing very well.   Continue to work with rehabilitation.   Excellent job with stopping smoking!  Medication:  Please continue to take your medications as prescribed.  Your prescription(s) have been submitted to your pharmacy or been printed and provided for you. Please take as directed and contact our office if you believe you are having problem(s) with the medication(s) or have any questions.  Labs:  Please stop by the lab on the lower level of the building for your blood work. Your results will be released to [place] (or called to you) after review, usually within 72 hours after test completion. If any changes need to be made, you will be notified at that same time.  1.) The lab is open from 7:30am to 5:30 pm Monday-Friday 2.) No appointment is necessary 3.) Fasting (if needed) is 6-8 hours after food and drink; black coffee and water are okay   Follow up:  If your symptoms worsen or fail to improve, please contact our office for further instruction, or in case of emergency go directly to the emergency room at the closest medical facility.

## 2016-10-11 NOTE — Assessment & Plan Note (Signed)
Maintained on atorvastatin with no adverse side effects or myalgias. Obtain lipid profile to check current status with goal of LDL less than 70. Continue current dosage of atorvastatin pending lipid profile results.

## 2016-10-11 NOTE — Progress Notes (Signed)
Subjective:    Patient ID: Andrea Kennedy, female    DOB: 1945/10/15, 71 y.o.   MRN: [id number]  Chief Complaint  Patient presents with  . Establish Care    wants to see if she can get a cheaper blood thinner sent to pharmacy, mood swings    HPI:  Andrea Kennedy is a 71 y.o. female who  has a past medical history of Hyperlipidemia; Lupus; Sciatica of right side; Stroke ([place]); and Tobacco abuse. and presents today for an office visit to establish care.   1.) Stroke - Recently evaluated in the emergency department and admitted to the hospital with the chief complaint of right-sided weakness, right-sided facial droop, right-sided neglect, and aphasia. There was questionable note of shaking with concern for possible seizure prior to arrival. Physical exam with ill-appearing and anxiousness. Orientation was unable to be establish secondary to aphasia. Noted to have 0 out of 5 muscle strength of the right upper extremity in 3/5 right lower extremity strength. CT scan showed small suspected acute left posterior frontal lobe infarct and moderate chronic small vessel disease. She was given TPA upon arrival to the emergency department. Following workup with MRI showing an acute moderate nonhemorrhagic left frontal lobe infarct. CTA of the head showed no emergent large vessel occlusion or high-grade stenosis. CTA of the neck showed moderate stenosis of the left vertebral artery. TTE showed cavity size normal with normal ejection fraction of 88-89% with no diastolic dysfunction. Her urine drug screen was positive for cocaine. Her LDL was noted to be 102 and her A1c of 5.2. She was also worked up for hypercoagulability and autoimmune given previous history of Lupus. Diagnosed with new onset atrial fibrillation and placed on metoprolol and Eliquis. No seizure activity was noted on EEG. Lupus was followed by rheumatology in [facility]. She is a current smoker with smoking cessation counseling provided in the  hospital. She was discharged to [facility]. All hospital records, labs and imaging were reviewed in detail.   2.) Hypertension - Not currently maintained on medication. Reports that her blood pressures have been adequately controlled with occasional higher readings. Denies worst headache of life with no new symptoms of end organ damage. Follows a low sodium diet with a lot of vegetables and avoiding fried foods.   BP Readings from Last 3 Encounters:  10/11/16 132/88  08/03/16 (!) 126/56  07/25/16 129/78    3.) Hyperlipidemia - Currently maintained on atorvastatin. Reports taking the medication as prescribed and denies adverse side effects or myalgias. Denies chest pain, shortness of breath or heart palpitations.   Lab Results  Component Value Date   CHOL 181 07/20/2016   HDL 62 07/20/2016   LDLCALC 102 (H) 07/20/2016   TRIG 85 07/20/2016   CHOLHDL 2.9 07/20/2016    4.) Atrial Fibrillation - Currently anticoagulated with Eliquis. Reports taking the medication as prescribed and denies adverse side effects. Denies chest pain, shortness of breath or heart palpitations. No nusisacne bleeding.   5.) Substance abuse - Denies any tobacco or cocaine usage and has remained sober since hospitalization.   Allergies  Allergen Reactions  . Floxin [Ofloxacin] Other (See Comments)    Scared/shaky/pain attacks/confusion      Outpatient Medications Prior to Visit  Medication Sig Dispense Refill  . acetaminophen (TYLENOL) 325 MG tablet Take 650 mg by mouth every 6 (six) hours as needed (for headache.).    Marland Kitchen apixaban (ELIQUIS) 5 MG TABS tablet Take 1 tablet (5 mg total) by mouth  2 (two) times daily. 60 tablet 0  . atorvastatin (LIPITOR) 20 MG tablet Take 1 tablet (20 mg total) by mouth daily at 6 PM. 30 tablet 1  . docusate sodium (COLACE) 100 MG capsule Take 1 capsule (100 mg total) by mouth 2 (two) times daily. 10 capsule 0  . methocarbamol (ROBAXIN) 500 MG tablet Take 1 tablet  (500 mg total) by mouth every 6 (six) hours as needed for muscle spasms. 60 tablet 0  . Multiple Vitamins-Minerals (ALIVE WOMENS 50+ PO) Take 1 tablet by mouth daily.    . pantoprazole (PROTONIX) 40 MG tablet Take 1 tablet (40 mg total) by mouth daily. 30 tablet 1  . vitamin B-12 1000 MCG tablet Take 1 tablet (1,000 mcg total) by mouth daily. 30 tablet 0  . nicotine (NICODERM CQ - DOSED IN MG/24 HOURS) 14 mg/24hr patch 14 mg patch daily 2 weeks then 7 mg patch daily 3 weeks and stop 28 patch 0  . traMADol (ULTRAM) 50 MG tablet Take 1-2 tablets (50-100 mg total) by mouth every 6 (six) hours as needed (mild pain). 20 tablet 0   No facility-administered medications prior to visit.      Past Medical History:  Diagnosis Date  . Hyperlipidemia   . Lupus   . Sciatica of right side   . Stroke ([place])   . Tobacco abuse       Past Surgical History:  Procedure Laterality Date  . TOTAL HIP ARTHROPLASTY Right 06/03/2015   Procedure: RIGHT TOTAL HIP ARTHROPLASTY ANTERIOR APPROACH;  Surgeon: Gaynelle Arabian, MD;  Location: [facility];  Service: Orthopedics;  Laterality: Right;  . TUBAL LIGATION        Family History  Problem Relation Age of Onset  . Breast cancer Mother   . Diabetes Mother   . Heart attack Father   . Fibromyalgia Sister       Social History   Social History  . Marital status: Married    Spouse name: N/A  . Number of children: 3  . Years of education: 12   Occupational History  . Not on file.   Social History Main Topics  . Smoking status: Former Smoker    Quit date: 07/01/2015  . Smokeless tobacco: Never Used  . Alcohol use Yes     Comment: Once in a while.   . Drug use: No  . Sexual activity: Not on file   Other Topics Concern  . Not on file   Social History Narrative  . No narrative on file      Review of Systems  Constitutional: Negative for chills and fever.  Eyes:       Negative for changes in vision  Respiratory: Negative for cough, chest  tightness and wheezing.   Cardiovascular: Negative for chest pain, palpitations and leg swelling.  Neurological: Negative for dizziness, weakness and light-headedness.  Hematological: Does not bruise/bleed easily.       Objective:    BP 132/88 (BP Location: Left Arm, Patient Position: Sitting, Cuff Size: Normal)   Pulse (!) 107   Temp 98.4 F (36.9 C) (Oral)   Resp 16   Ht _0  (1.727 m)   Wt 127 lb (57.6 kg)   SpO2 96%   BMI 19.31 kg/m  Nursing note and vital signs reviewed.  Physical Exam  Constitutional: She appears well-developed and well-nourished. No distress.  Cardiovascular: Normal rate, normal heart sounds and intact distal pulses.  An irregularly irregular rhythm present.  Pulmonary/Chest: Effort normal and  breath sounds normal.  Neurological:  Alert and oriented 3 with mild dysphasia and some dysarthria. Muscle strength slightly decreased on the right side with fine motor movements especially in the right hand. Cranial nerves are intact and appropriate. Mild drift noted on the right side. No visual deficits able to be elicited.  Skin: Skin is warm and dry.  Psychiatric: She has a normal mood and affect. Her behavior is normal. Judgment and thought content normal.        Assessment & Plan:   Problem List Items Addressed This Visit      Cardiovascular and Mediastinum   Stroke (cerebrum) ([place]) - L frontal embolic s/p IV tPA, d/t AF - Primary    Appears stable and continues to work with rehabilitation to optimize function from previous stroke. Goal is to decrease stroke risk factors and increase function. Continues to remain anticoagulated with Eliquis. Continue with risk factor reduction. Continue to monitor.       Relevant Orders   Lipid Profile   Comp Met (CMET)   Paroxysmal atrial fibrillation (HCC)    Atrial fibrillation noted on exam today. No chest pain and appears rate controlled. Continue current dosage of Eliquis for anticoagulation. Continue to  monitor.       Benign essential HTN    Blood pressure appears well controlled and below goal 140/90 with lifestyle management. Consider possible addition of Ace/ARB for cardiac/renal protection. Continue to monitor blood pressure at home and follow sodium diet.        Nervous and Auditory   Right hemiparesis (HCC)    Right sided hemipareis appears to be improving with rehabilitation. She has decreased fine motor coordination of the right had and does not currently have the ability to write. Continue to work with rehabilitation.         Other   Smoker   Pure hypercholesterolemia    Maintained on atorvastatin with no adverse side effects or myalgias. Obtain lipid profile to check current status with goal of LDL less than 70. Continue current dosage of atorvastatin pending lipid profile results.      Relevant Orders   Lipid Profile   Comp Met (CMET)   Tobacco abuse    Has remained tobacco free since hospitalization. Praised for tobacco cessation. Continue to monitor.          I have discontinued Ms. Hadley's nicotine and traMADol. I am also having her maintain her acetaminophen, Multiple Vitamins-Minerals (ALIVE WOMENS 50+ PO), docusate sodium, atorvastatin, methocarbamol, cyanocobalamin, pantoprazole, and apixaban.    Follow-up: Return in about 1 month (around 11/08/2016), or if symptoms worsen or fail to improve.  Mauricio Po, FNP

## 2016-10-11 NOTE — Assessment & Plan Note (Signed)
Atrial fibrillation noted on exam today. No chest pain and appears rate controlled. Continue current dosage of Eliquis for anticoagulation. Continue to monitor.

## 2016-10-11 NOTE — Assessment & Plan Note (Signed)
Has remained tobacco free since hospitalization. Praised for tobacco cessation. Continue to monitor.

## 2016-10-11 NOTE — Assessment & Plan Note (Signed)
Appears stable and continues to work with rehabilitation to optimize function from previous stroke. Goal is to decrease stroke risk factors and increase function. Continues to remain anticoagulated with Eliquis. Continue with risk factor reduction. Continue to monitor.

## 2016-10-11 NOTE — Assessment & Plan Note (Signed)
Blood pressure appears well controlled and below goal 140/90 with lifestyle management. Consider possible addition of Ace/ARB for cardiac/renal protection. Continue to monitor blood pressure at home and follow sodium diet.

## 2016-10-11 NOTE — Assessment & Plan Note (Signed)
Right sided hemipareis appears to be improving with rehabilitation. She has decreased fine motor coordination of the right had and does not currently have the ability to write. Continue to work with rehabilitation.

## 2016-10-13 ENCOUNTER — Encounter: Payer: Medicare HMO | Admitting: Occupational Therapy

## 2016-10-13 ENCOUNTER — Encounter: Payer: Medicare HMO | Admitting: Speech Pathology

## 2016-10-26 ENCOUNTER — Other Ambulatory Visit: Payer: Self-pay

## 2016-10-27 ENCOUNTER — Other Ambulatory Visit: Payer: Self-pay

## 2016-10-27 NOTE — Patient Outreach (Signed)
Telephone outreach to patient to obtain mRS was successfully completed. mRS = 3  Andrea Kennedy, [facility] Management Assistant

## 2017-03-03 ENCOUNTER — Encounter (HOSPITAL_COMMUNITY): Payer: Self-pay | Admitting: Emergency Medicine

## 2017-03-03 ENCOUNTER — Emergency Department (HOSPITAL_COMMUNITY): Payer: Medicare HMO

## 2017-03-03 ENCOUNTER — Inpatient Hospital Stay (HOSPITAL_COMMUNITY)
Admission: EM | Admit: 2017-03-03 | Discharge: 2017-03-08 | DRG: 065 | Disposition: A | Discharge status: Skilled Nursing Facility | Payer: Medicare HMO | Attending: Family Medicine | Admitting: Family Medicine

## 2017-03-03 DIAGNOSIS — I6930 Unspecified sequelae of cerebral infarction: Secondary | ICD-10-CM | POA: Diagnosis not present

## 2017-03-03 DIAGNOSIS — I480 Paroxysmal atrial fibrillation: Secondary | ICD-10-CM | POA: Diagnosis present

## 2017-03-03 DIAGNOSIS — Z8249 Family history of ischemic heart disease and other diseases of the circulatory system: Secondary | ICD-10-CM

## 2017-03-03 DIAGNOSIS — I69322 Dysarthria following cerebral infarction: Secondary | ICD-10-CM | POA: Diagnosis not present

## 2017-03-03 DIAGNOSIS — N179 Acute kidney failure, unspecified: Secondary | ICD-10-CM | POA: Diagnosis present

## 2017-03-03 DIAGNOSIS — M419 Scoliosis, unspecified: Secondary | ICD-10-CM | POA: Diagnosis present

## 2017-03-03 DIAGNOSIS — E785 Hyperlipidemia, unspecified: Secondary | ICD-10-CM | POA: Diagnosis present

## 2017-03-03 DIAGNOSIS — Z79899 Other long term (current) drug therapy: Secondary | ICD-10-CM | POA: Diagnosis not present

## 2017-03-03 DIAGNOSIS — G8191 Hemiplegia, unspecified affecting right dominant side: Secondary | ICD-10-CM

## 2017-03-03 DIAGNOSIS — Z7901 Long term (current) use of anticoagulants: Secondary | ICD-10-CM

## 2017-03-03 DIAGNOSIS — I63412 Cerebral infarction due to embolism of left middle cerebral artery: Principal | ICD-10-CM | POA: Diagnosis present

## 2017-03-03 DIAGNOSIS — I69320 Aphasia following cerebral infarction: Secondary | ICD-10-CM

## 2017-03-03 DIAGNOSIS — R45851 Suicidal ideations: Secondary | ICD-10-CM | POA: Diagnosis present

## 2017-03-03 DIAGNOSIS — W07XXXA Fall from chair, initial encounter: Secondary | ICD-10-CM | POA: Diagnosis present

## 2017-03-03 DIAGNOSIS — I63532 Cerebral infarction due to unspecified occlusion or stenosis of left posterior cerebral artery: Secondary | ICD-10-CM | POA: Diagnosis not present

## 2017-03-03 DIAGNOSIS — Z9114 Patient's other noncompliance with medication regimen: Secondary | ICD-10-CM

## 2017-03-03 DIAGNOSIS — I361 Nonrheumatic tricuspid (valve) insufficiency: Secondary | ICD-10-CM | POA: Diagnosis not present

## 2017-03-03 DIAGNOSIS — R531 Weakness: Secondary | ICD-10-CM | POA: Diagnosis not present

## 2017-03-03 DIAGNOSIS — Z833 Family history of diabetes mellitus: Secondary | ICD-10-CM | POA: Diagnosis not present

## 2017-03-03 DIAGNOSIS — T7491XA Unspecified adult maltreatment, confirmed, initial encounter: Secondary | ICD-10-CM | POA: Diagnosis present

## 2017-03-03 DIAGNOSIS — I340 Nonrheumatic mitral (valve) insufficiency: Secondary | ICD-10-CM | POA: Diagnosis not present

## 2017-03-03 DIAGNOSIS — M549 Dorsalgia, unspecified: Secondary | ICD-10-CM

## 2017-03-03 DIAGNOSIS — Z96641 Presence of right artificial hip joint: Secondary | ICD-10-CM | POA: Diagnosis present

## 2017-03-03 DIAGNOSIS — I639 Cerebral infarction, unspecified: Secondary | ICD-10-CM | POA: Diagnosis present

## 2017-03-03 DIAGNOSIS — E7800 Pure hypercholesterolemia, unspecified: Secondary | ICD-10-CM | POA: Diagnosis present

## 2017-03-03 DIAGNOSIS — F1410 Cocaine abuse, uncomplicated: Secondary | ICD-10-CM | POA: Diagnosis present

## 2017-03-03 DIAGNOSIS — R29709 NIHSS score 9: Secondary | ICD-10-CM | POA: Diagnosis present

## 2017-03-03 DIAGNOSIS — D638 Anemia in other chronic diseases classified elsewhere: Secondary | ICD-10-CM | POA: Diagnosis present

## 2017-03-03 DIAGNOSIS — I10 Essential (primary) hypertension: Secondary | ICD-10-CM | POA: Diagnosis present

## 2017-03-03 DIAGNOSIS — T7491XS Unspecified adult maltreatment, confirmed, sequela: Secondary | ICD-10-CM | POA: Diagnosis not present

## 2017-03-03 DIAGNOSIS — T7431XA Adult psychological abuse, confirmed, initial encounter: Secondary | ICD-10-CM | POA: Diagnosis present

## 2017-03-03 DIAGNOSIS — I63032 Cerebral infarction due to thrombosis of left carotid artery: Secondary | ICD-10-CM | POA: Diagnosis not present

## 2017-03-03 DIAGNOSIS — F329 Major depressive disorder, single episode, unspecified: Secondary | ICD-10-CM | POA: Diagnosis present

## 2017-03-03 DIAGNOSIS — D62 Acute posthemorrhagic anemia: Secondary | ICD-10-CM

## 2017-03-03 DIAGNOSIS — R29810 Facial weakness: Secondary | ICD-10-CM | POA: Diagnosis present

## 2017-03-03 DIAGNOSIS — M48061 Spinal stenosis, lumbar region without neurogenic claudication: Secondary | ICD-10-CM | POA: Diagnosis present

## 2017-03-03 DIAGNOSIS — Y92009 Unspecified place in unspecified non-institutional (private) residence as the place of occurrence of the external cause: Secondary | ICD-10-CM | POA: Diagnosis not present

## 2017-03-03 DIAGNOSIS — Z87891 Personal history of nicotine dependence: Secondary | ICD-10-CM

## 2017-03-03 LAB — CBC WITH DIFFERENTIAL/PLATELET
BASOS ABS: 0 10*3/uL (ref 0.0–0.1)
Basophils Relative: 0 %
EOS PCT: 2 %
Eosinophils Absolute: 0.2 10*3/uL (ref 0.0–0.7)
HEMATOCRIT: 34.1 % — AB (ref 36.0–46.0)
Hemoglobin: 11.1 g/dL — ABNORMAL LOW (ref 12.0–15.0)
LYMPHS ABS: 0.9 10*3/uL (ref 0.7–4.0)
LYMPHS PCT: 11 %
MCH: 32.6 pg (ref 26.0–34.0)
MCHC: 32.6 g/dL (ref 30.0–36.0)
MCV: 100 fL (ref 78.0–100.0)
MONO ABS: 0.5 10*3/uL (ref 0.1–1.0)
MONOS PCT: 6 %
NEUTROS ABS: 6.2 10*3/uL (ref 1.7–7.7)
Neutrophils Relative %: 81 %
PLATELETS: 283 10*3/uL (ref 150–400)
RBC: 3.41 MIL/uL — ABNORMAL LOW (ref 3.87–5.11)
RDW: 15.8 % — AB (ref 11.5–15.5)
WBC: 7.8 10*3/uL (ref 4.0–10.5)

## 2017-03-03 LAB — COMPREHENSIVE METABOLIC PANEL
ALBUMIN: 3.9 g/dL (ref 3.5–5.0)
ALT: 12 U/L — ABNORMAL LOW (ref 14–54)
ANION GAP: 10 (ref 5–15)
AST: 24 U/L (ref 15–41)
Alkaline Phosphatase: 54 U/L (ref 38–126)
BILIRUBIN TOTAL: 1 mg/dL (ref 0.3–1.2)
BUN: 11 mg/dL (ref 6–20)
CO2: 24 mmol/L (ref 22–32)
Calcium: 9 mg/dL (ref 8.9–10.3)
Chloride: 106 mmol/L (ref 101–111)
Creatinine, Ser: 1.09 mg/dL — ABNORMAL HIGH (ref 0.44–1.00)
GFR calc Af Amer: 59 mL/min — ABNORMAL LOW (ref >60)
GFR calc non Af Amer: 51 mL/min — ABNORMAL LOW (ref >60)
GLUCOSE: 105 mg/dL — AB (ref 65–99)
POTASSIUM: 3.6 mmol/L (ref 3.5–5.1)
SODIUM: 140 mmol/L (ref 135–145)
TOTAL PROTEIN: 6.3 g/dL — AB (ref 6.5–8.1)

## 2017-03-03 LAB — PROTIME-INR
INR: 1.04
PROTHROMBIN TIME: 13.7 s (ref 11.4–15.2)

## 2017-03-03 LAB — I-STAT TROPONIN, ED: Troponin i, poc: 0.01 ng/mL (ref 0.00–0.08)

## 2017-03-03 MED ORDER — ASPIRIN 325 MG PO TABS
325.0000 mg | ORAL_TABLET | Freq: Every day | ORAL | Status: DC
  Administered 2017-03-03 – 2017-03-08 (×6): 325 mg via ORAL
  Filled 2017-03-03 (×6): qty 1

## 2017-03-03 MED ORDER — ACETAMINOPHEN 650 MG RE SUPP: 650.0000 mg | RECTAL | Status: DC | PRN

## 2017-03-03 MED ORDER — MORPHINE SULFATE (PF) 4 MG/ML IV SOLN: 2.0000 mg | Freq: Once | INTRAVENOUS | Status: DC

## 2017-03-03 MED ORDER — LORAZEPAM 2 MG/ML IJ SOLN
1.0000 mg | Freq: Once | INTRAMUSCULAR | Status: AC
  Administered 2017-03-03: 1 mg via INTRAVENOUS
  Filled 2017-03-03: qty 1

## 2017-03-03 MED ORDER — SODIUM CHLORIDE 0.9 % IV BOLUS (SEPSIS)
1000.0000 mL | Freq: Once | INTRAVENOUS | Status: AC
  Administered 2017-03-03: 1000 mL via INTRAVENOUS

## 2017-03-03 MED ORDER — DOCUSATE SODIUM 100 MG PO CAPS: 100.0000 mg | ORAL_CAPSULE | Freq: Two times a day (BID) | ORAL | Status: DC | PRN

## 2017-03-03 MED ORDER — PANTOPRAZOLE SODIUM 40 MG PO TBEC
40.0000 mg | DELAYED_RELEASE_TABLET | Freq: Every day | ORAL | Status: DC
  Administered 2017-03-03 – 2017-03-08 (×6): 40 mg via ORAL
  Filled 2017-03-03 (×6): qty 1

## 2017-03-03 MED ORDER — ACETAMINOPHEN 500 MG PO TABS
1000.0000 mg | ORAL_TABLET | Freq: Once | ORAL | Status: AC
Start: 2017-03-03 — End: 2017-03-03
  Administered 2017-03-03: 1000 mg via ORAL
  Filled 2017-03-03: qty 2

## 2017-03-03 MED ORDER — METHOCARBAMOL 500 MG PO TABS
500.0000 mg | ORAL_TABLET | Freq: Four times a day (QID) | ORAL | Status: DC | PRN
  Administered 2017-03-04 – 2017-03-07 (×6): 500 mg via ORAL
  Filled 2017-03-03 (×6): qty 1

## 2017-03-03 MED ORDER — ACETAMINOPHEN 325 MG PO TABS: 650.0000 mg | ORAL_TABLET | Freq: Four times a day (QID) | ORAL | Status: DC | PRN

## 2017-03-03 MED ORDER — STROKE: EARLY STAGES OF RECOVERY BOOK
Freq: Once | Status: AC
  Administered 2017-03-03: 18:00:00
  Filled 2017-03-03: qty 1

## 2017-03-03 MED ORDER — SODIUM CHLORIDE 0.9 % IV SOLN
INTRAVENOUS | Status: DC
  Administered 2017-03-03: 17:00:00 via INTRAVENOUS
  Administered 2017-03-04 (×2): 1000 mL via INTRAVENOUS
  Administered 2017-03-04 – 2017-03-08 (×8): via INTRAVENOUS

## 2017-03-03 MED ORDER — ATORVASTATIN CALCIUM 20 MG PO TABS
20.0000 mg | ORAL_TABLET | Freq: Every day | ORAL | Status: DC
  Administered 2017-03-03 – 2017-03-07 (×5): 20 mg via ORAL
  Filled 2017-03-03 (×5): qty 1

## 2017-03-03 MED ORDER — APIXABAN 5 MG PO TABS: 5.0000 mg | ORAL_TABLET | Freq: Two times a day (BID) | ORAL | Status: DC

## 2017-03-03 MED ORDER — ACETAMINOPHEN 325 MG PO TABS
650.0000 mg | ORAL_TABLET | ORAL | Status: DC | PRN
  Administered 2017-03-03 – 2017-03-08 (×10): 650 mg via ORAL
  Filled 2017-03-03 (×10): qty 2

## 2017-03-03 MED ORDER — ACETAMINOPHEN 160 MG/5ML PO SOLN: 650.0000 mg | ORAL | Status: DC | PRN
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
## 2017-03-03 NOTE — ED Notes (Signed)
To MRI
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
## 2017-03-03 NOTE — ED Notes (Signed)
Pt transported back to room from MRI by Santiago Glad and this RN
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
## 2017-03-03 NOTE — ED Notes (Signed)
Pt to x-ray with transporter .
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
## 2017-03-03 NOTE — ED Notes (Addendum)
Social worker in room.

## 2017-03-03 NOTE — ED Notes (Signed)
Hospitalist in room.

## 2017-03-03 NOTE — ED Notes (Signed)
Pt c/o low back pain -- requesting meds for "claustrophobia" for MRI.

## 2017-03-04 ENCOUNTER — Inpatient Hospital Stay (HOSPITAL_COMMUNITY): Payer: Medicare HMO

## 2017-03-04 DIAGNOSIS — I340 Nonrheumatic mitral (valve) insufficiency: Secondary | ICD-10-CM

## 2017-03-04 DIAGNOSIS — I63532 Cerebral infarction due to unspecified occlusion or stenosis of left posterior cerebral artery: Secondary | ICD-10-CM

## 2017-03-04 DIAGNOSIS — I361 Nonrheumatic tricuspid (valve) insufficiency: Secondary | ICD-10-CM

## 2017-03-04 LAB — LIPID PANEL
CHOL/HDL RATIO: 2.7 ratio
Cholesterol: 147 mg/dL (ref 0–200)
HDL: 55 mg/dL (ref >40)
LDL CALC: 80 mg/dL (ref 0–99)
Triglycerides: 58 mg/dL (ref <150)
VLDL: 12 mg/dL (ref 0–40)

## 2017-03-04 LAB — ECHOCARDIOGRAM COMPLETE
AVLVOTPG: 10 mmHg
Ao-asc: 33 cm
CHL CUP DOP CALC LVOT VTI: 38.9 cm
CHL CUP MV DEC (S): 259
E decel time: 259 msec
E/e' ratio: 6.71
FS: 39 % (ref 28–44)
HEIGHTINCHES: 69 in
IV/PV OW: 0.9
LA diam end sys: 45 mm
LA vol index: 40.7 mL/m2
LADIAMINDEX: 2.64 cm/m2
LASIZE: 45 mm
LAVOL: 69.3 mL
LAVOLA4C: 69.1 mL
LV E/e' medial: 6.71
LV E/e'average: 6.71
LV e' LATERAL: 13.6 cm/s
LVOT SV: 110 mL
LVOT area: 2.84 cm2
LVOT peak vel: 156 cm/s
LVOTD: 19 mm
MV Peak grad: 3 mmHg
MVPKAVEL: 29.5 m/s
MVPKEVEL: 91.3 m/s
PW: 10 mm — AB (ref 0.6–1.1)
RV LATERAL S' VELOCITY: 13.9 cm/s
RV sys press: 25 mmHg
Reg peak vel: 235 cm/s
TAPSE: 21.6 mm
TDI e' lateral: 13.6
TDI e' medial: 9.17
TRMAXVEL: 235 cm/s
WEIGHTICAEL: 2124.8 [oz_av]

## 2017-03-04 LAB — RAPID URINE DRUG SCREEN, HOSP PERFORMED
Amphetamines: NOT DETECTED
BARBITURATES: NOT DETECTED
BENZODIAZEPINES: NOT DETECTED
COCAINE: POSITIVE — AB
Opiates: NOT DETECTED
TETRAHYDROCANNABINOL: NOT DETECTED

## 2017-03-04 LAB — ETHANOL: Alcohol, Ethyl (B): <5 mg/dL (ref <5)

## 2017-03-04 NOTE — Care Management (Addendum)
1304 03-04-17 Staff RN Delcine did ask CM to look into cost of Eliquis. CM will only be able to provide pt with 30 day free card. Unable to check benefits due to weekend. Pt's pharmacy would have to make pt aware of cost once d/c. 30 day free card to be given. Per pt she has been on Eliquis before and cost was expensive-Unable to afford. Rx can be sent to [facility] for test claim to check cost if pt d/c before Monday. If pt is still hospitalized on Monday CM will be able to do benefits check. Pt gets $408.00 in [employer].  Pt states she does not drive and lives with her husband and mother-n-law. Pt has a friend Susie that picks up her medications. CM will continue to monitor. Bethena Roys, RN, BSN [phone]

## 2017-03-04 NOTE — Progress Notes (Signed)
*  PRELIMINARY RESULTS* Vascular Ultrasound Carotid Duplex (Doppler) has been completed.  Preliminary findings: Bilateral: No significant (1-39%) ICA stenosis. Antegrade vertebral flow.     Landry Mellow, RDMS, RVT  03/04/2017, 12:27 PM
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
## 2017-03-04 NOTE — Evaluation (Signed)
Occupational Therapy Evaluation Patient Details Name: Andrea Kennedy MRN: [id number] DOB: 20-Mar-1946 Today's Date: 03/04/2017    History of Present Illness 71 yo female with onset of stroke affecting L frontal lobe, unable to give tPA.  Has fallen, dizziness, AKI.  PMHx:  Wallerian degeneration, L5 compression fracture, L3-4 stenosis, encephalomalacia, PAF, R THA.  Presents with R hemiparesis and ankle in PF tone, unable to take a step on RLE.   Clinical Impression   Pt with decline in function and safet with ADLs and ADL mobility with decreased strength, balance and endurance. Pt with hx of R UE hemiplegia from CVA with increased tone demonstrated. Pt would benefit from acute OT services to address impairments to increase level of function and safety    Follow Up Recommendations  CIR    Equipment Recommendations  Other (comment) (TBD at next venue of care)    Recommendations for Other Services Rehab consult     Precautions / Restrictions Precautions Precautions: Fall Restrictions Weight Bearing Restrictions: No Other Position/Activity Restrictions: functionally NWB on RLE      Mobility Bed Mobility Overal bed mobility: Needs Assistance Bed Mobility: Supine to Sit;Sit to Supine     Supine to sit: Mod assist Sit to supine: Mod assist   General bed mobility comments: trunk support and help to pivot hips on bed pad due to her inability to use RUE and RLE  Transfers Overall transfer level: Needs assistance Equipment used: 1 person hand held assist Transfers: Sit to/from [facility] Sit to Stand: Mod assist Stand pivot transfers: Mod assist;From elevated surface       General transfer comment: pt declined statingt aht she just got back into bed from recliner with PT. Per PT note, pt requires mod A     Balance Overall balance assessment: Needs assistance   Sitting balance-Leahy Scale: Poor                                     ADL  either performed or assessed with clinical judgement   ADL Overall ADL's : Needs assistance/impaired Eating/Feeding: Set up Eating/Feeding Details (indicate cue type and reason): total A to open drinks, containers and condiments Grooming: Wash/dry hands;Wash/dry face;Min guard;Sitting   Upper Body Bathing: Moderate assistance   Lower Body Bathing: Maximal assistance   Upper Body Dressing : Moderate assistance   Lower Body Dressing: Total assistance     Toilet Transfer Details (indicate cue type and reason): pt declined statingt aht she just got back into bed from recliner with PT. Per PT note, pt requires mod A                  Vision Baseline Vision/History: Wears glasses Wears Glasses: Reading only Patient Visual Report: No change from baseline       Perception     Praxis      Pertinent Vitals/Pain Pain Assessment: 0-10 Pain Score: 6  Faces Pain Scale: Hurts little more Pain Location: R hip, headache Pain Descriptors / Indicators: Tender Pain Intervention(s): Limited activity within patient's tolerance;Monitored during session;Repositioned     Hand Dominance Right (uses L hand due to hx of CVA)   Extremity/Trunk Assessment Upper Extremity Assessment Upper Extremity Assessment: RUE deficits/detail RUE Deficits / Details: flexion tone contracture RUE Coordination: decreased fine motor;decreased gross motor   Lower Extremity Assessment Lower Extremity Assessment: Generalized weakness;RLE deficits/detail RLE Deficits / Details: Pt actively only moves her  R hamstrings and her R foot in a tight PF contracture which does not fully stretch to neutral   Cervical / Trunk Assessment Cervical / Trunk Assessment: Kyphotic   Communication Communication Communication: Expressive difficulties   Cognition Arousal/Alertness: Awake/alert Behavior During Therapy: WFL for tasks assessed/performed Overall Cognitive Status: No family/caregiver present to determine baseline  cognitive functioning                                 General Comments: not sure if history is accurate as no family there to validate   General Comments   pt pleasant              Home Living Family/patient expects to be discharged to:: Private residence Living Arrangements: Spouse/significant other Available Help at Discharge: Family;Available 24 hours/day Type of Home: House Home Access: Stairs to enter [employer] of Steps: 3 Entrance Stairs-Rails: Right;Left;Can reach both Home Layout: One level     Bathroom Shower/Tub: Teacher, early years/pre: Standard     Home Equipment: Environmental consultant - 2 wheels;Cane - single point   Additional Comments: Pt had previous stroke on R side but had improved her mobility and now is worse      Prior Functioning/Environment Level of Independence: Needs assistance    ADL's / Homemaking Assistance Needed: pt initially reports thats she is indpendent with ADLs, then stated that she needs some assist            OT Problem List: Decreased strength;Impaired balance (sitting and/or standing);Decreased knowledge of precautions;Pain;Impaired tone;Impaired UE functional use;Decreased coordination;Decreased activity tolerance;Decreased knowledge of use of DME or AE      OT Treatment/Interventions: Self-care/ADL training;Therapeutic activities;Balance training;DME and/or AE instruction;Therapeutic exercise;Neuromuscular education;Patient/family education    OT Goals(Current goals can be found in the care plan section) Acute Rehab OT Goals Patient Stated Goal: get better and go to her daughter's house OT Goal Formulation: With patient Time For Goal Achievement: 03/11/17 Potential to Achieve Goals: Good ADL Goals Pt Will Perform Grooming: with min guard assist;with supervision;with set-up;sitting Pt Will Perform Upper Body Bathing: with min assist;sitting Pt Will Perform Upper Body Dressing: with min  assist;sitting Pt Will Transfer to Toilet: with min assist;stand pivot transfer;bedside commode Additional ADL Goal #1: Pt will complete bed mobility with min A to sit EOB in prep for ADLs  OT Frequency: Min 2X/week   Barriers to D/C: Decreased caregiver support                        AM-PAC PT "6 Clicks" Daily Activity     Outcome Measure Help from another person eating meals?: A Little Help from another person taking care of personal grooming?: A Little Help from another person toileting, which includes using toliet, bedpan, or urinal?: Total Help from another person bathing (including washing, rinsing, drying)?: A Lot Help from another person to put on and taking off regular upper body clothing?: A Little Help from another person to put on and taking off regular lower body clothing?: Total 6 Click Score: 13   End of Session    Activity Tolerance: Patient limited by fatigue;Patient limited by pain Patient left: in bed;with call bell/phone within reach;with bed alarm set  OT Visit Diagnosis: Pain;Hemiplegia and hemiparesis;Muscle weakness (generalized) (M62.81) Hemiplegia - Right/Left: Right Hemiplegia - dominant/non-dominant: Dominant Pain - Right/Left: Right Pain - part of body: Hip  Time: 5248-1859 OT Time Calculation (min): 25 min Charges:  OT General Charges $OT Visit: 1 Procedure OT Evaluation $OT Eval Moderate Complexity: 1 Procedure OT Treatments $Therapeutic Activity: 8-22 mins G-Codes: OT G-codes **NOT FOR INPATIENT CLASS** Functional Assessment Tool Used: AM-PAC 6 Clicks Daily Activity     Britt Bottom 03/04/2017, 1:35 PM

## 2017-03-04 NOTE — Evaluation (Signed)
Physical Therapy Evaluation Patient Details Name: Andrea Kennedy MRN: [id number] DOB: 10-Jan-1946 Today's Date: 03/04/2017   History of Present Illness  71 yo female with onset of stroke affecting L frontal lobe, unable to give tPA.  Has fallen, dizziness, AKI.  PMHx:  Wallerian degeneration, L5 compression fracture, L3-4 stenosis, encephalomalacia, PAF, R THA.  Presents with R hemiparesis and ankle in PF tone, unable to take a step on RLE.  Clinical Impression  Pt was assessed for mobility and transfers, and will recommend CIR to increase her capability to walk.  Pt is reverting to a previous level with the new stroke changes, with increased R side tone.  Her plan is to follow acutely and recommend inpt therapy due to increased stroke effects, with gait and balance work in standing as her RLE is able to tolerate and perform.    Follow Up Recommendations CIR    Equipment Recommendations  None recommended by PT    Recommendations for Other Services Rehab consult     Precautions / Restrictions Precautions Precautions: Fall Restrictions Weight Bearing Restrictions: No Other Position/Activity Restrictions: functionally NWB on RLE      Mobility  Bed Mobility Overal bed mobility: Needs Assistance Bed Mobility: Supine to Sit     Supine to sit: Mod assist     General bed mobility comments: trunk support and help to pivot hips on bed pad due to her inability to use RUE and RLE  Transfers Overall transfer level: Needs assistance Equipment used: 1 person hand held assist Transfers: Sit to/from [facility] Sit to Stand: Mod assist Stand pivot transfers: Mod assist;From elevated surface       General transfer comment: Pt is unable to use her RUE to assist walker and unable to wb on RLE effectively  Ambulation/Gait   Ambulation Distance (Feet): 0 Feet         General Gait Details: unable to step to chair  Stairs            Wheelchair Mobility     Modified Rankin (Stroke Patients Only) Modified Rankin (Stroke Patients Only) Pre-Morbid Rankin Score: Slight disability Modified Rankin: Moderately severe disability     Balance                                             Pertinent Vitals/Pain Pain Assessment: Faces Faces Pain Scale: Hurts little more Pain Location: R hip since fall at home Pain Descriptors / Indicators: Tender Pain Intervention(s): Limited activity within patient's tolerance;Monitored during session;Repositioned    Home Living Family/patient expects to be discharged to:: Private residence Living Arrangements: Spouse/significant other Available Help at Discharge: Family;Available 24 hours/day Type of Home: House Home Access: Stairs to enter Entrance Stairs-Rails: Right;Left;Can reach both Entrance Stairs-Number of Steps: 3 Home Layout: One level Home Equipment: Walker - 2 wheels;Cane - single point Additional Comments: Pt had previous stroke on R side but had improved her mobility and now is worse    Prior Function Level of Independence: Independent               Hand Dominance        Extremity/Trunk Assessment   Upper Extremity Assessment Upper Extremity Assessment: RUE deficits/detail RUE Deficits / Details: flexion tone contracture RUE Coordination: decreased fine motor;decreased gross motor    Lower Extremity Assessment Lower Extremity Assessment: Generalized weakness;RLE deficits/detail RLE Deficits / Details: Pt  actively only moves her R hamstrings and her R foot in a tight PF contracture which does not fully stretch to neutral    Cervical / Trunk Assessment Cervical / Trunk Assessment: Kyphotic  Communication   Communication: Expressive difficulties  Cognition Arousal/Alertness: Awake/alert Behavior During Therapy: WFL for tasks assessed/performed Overall Cognitive Status: No family/caregiver present to determine baseline cognitive functioning                                  General Comments: not sure if history is accurate as no family there to validate      General Comments      Exercises     Assessment/Plan    PT Assessment Patient needs continued PT services  PT Problem List Decreased strength;Decreased range of motion;Decreased activity tolerance;Decreased balance;Decreased mobility;Decreased coordination;Decreased safety awareness;Decreased skin integrity;Pain       PT Treatment Interventions DME instruction;Gait training;Functional mobility training;Therapeutic activities;Therapeutic exercise;Balance training;Neuromuscular re-education;Patient/family education    PT Goals (Current goals can be found in the Care Plan section)  Acute Rehab PT Goals Patient Stated Goal: to get her RLE stronger and walk again PT Goal Formulation: With patient Time For Goal Achievement: 03/18/17 Potential to Achieve Goals: Good    Frequency Min 4X/week   Barriers to discharge Inaccessible home environment;Decreased caregiver support (per chart 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 : Total Difficulty sitting down on and standing up from a chair with arms (e.g., wheelchair, bedside commode, etc,.)?: Total Help needed moving to and from a bed to chair (including a wheelchair)?: A Lot Help needed walking in hospital room?: Total Help needed climbing 3-5 steps with a railing? : Total 6 Click Score: 7    End of Session Equipment Utilized During Treatment: Gait belt Activity Tolerance: Patient limited by fatigue;Treatment limited secondary to medical complications (Comment) (R hemiparesis with PF tone R ankle and foot) Patient left: in chair;with call bell/phone  within reach;with chair alarm set;Other (comment) (nsg set up Andrea Kennedy in chair) Nurse Communication: Mobility status PT Visit Diagnosis: Unsteadiness on feet (R26.81);Muscle weakness (generalized) (M62.81);History of falling (Z91.81);Difficulty in walking, not elsewhere classified (R26.2)    Time: 1001-1033 PT Time Calculation (min) (ACUTE ONLY): 32 min   Charges:   PT Evaluation $PT Eval Moderate Complexity: 1 Procedure PT Treatments $Therapeutic Activity: 8-22 mins   PT G Codes:   PT G-Codes **NOT FOR INPATIENT CLASS** Functional Assessment Tool Used: AM-PAC 6 Clicks Basic Mobility    Ramond Dial 03/04/2017, 1:17 PM   Mee Hives, PT MS Acute Rehab Dept. Number: [place] and [place]

## 2017-03-04 NOTE — Progress Notes (Signed)
  Echocardiogram 2D Echocardiogram has been performed.  Andrea Kennedy 03/04/2017, 3:33 PM

## 2017-03-05 LAB — BASIC METABOLIC PANEL
Anion gap: 5 (ref 5–15)
BUN: 7 mg/dL (ref 6–20)
CALCIUM: 8.5 mg/dL — AB (ref 8.9–10.3)
CHLORIDE: 113 mmol/L — AB (ref 101–111)
CO2: 24 mmol/L (ref 22–32)
CREATININE: 0.91 mg/dL (ref 0.44–1.00)
Glucose, Bld: 103 mg/dL — ABNORMAL HIGH (ref 65–99)
Potassium: 3.7 mmol/L (ref 3.5–5.1)
SODIUM: 142 mmol/L (ref 135–145)

## 2017-03-05 LAB — CBC
HCT: 30.7 % — ABNORMAL LOW (ref 36.0–46.0)
HEMOGLOBIN: 10 g/dL — AB (ref 12.0–15.0)
MCH: 32.8 pg (ref 26.0–34.0)
MCHC: 32.6 g/dL (ref 30.0–36.0)
MCV: 100.7 fL — ABNORMAL HIGH (ref 78.0–100.0)
PLATELETS: 220 10*3/uL (ref 150–400)
RBC: 3.05 MIL/uL — AB (ref 3.87–5.11)
RDW: 16.1 % — ABNORMAL HIGH (ref 11.5–15.5)
WBC: 5.4 10*3/uL (ref 4.0–10.5)

## 2017-03-05 MED ORDER — LORAZEPAM 1 MG PO TABS
1.0000 mg | ORAL_TABLET | Freq: Once | ORAL | Status: AC
  Administered 2017-03-06: 1 mg via ORAL
  Filled 2017-03-05: qty 1
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
## 2017-03-06 DIAGNOSIS — T7491XS Unspecified adult maltreatment, confirmed, sequela: Secondary | ICD-10-CM

## 2017-03-06 DIAGNOSIS — I63032 Cerebral infarction due to thrombosis of left carotid artery: Secondary | ICD-10-CM

## 2017-03-06 DIAGNOSIS — I480 Paroxysmal atrial fibrillation: Secondary | ICD-10-CM

## 2017-03-06 DIAGNOSIS — G8191 Hemiplegia, unspecified affecting right dominant side: Secondary | ICD-10-CM

## 2017-03-06 DIAGNOSIS — F1410 Cocaine abuse, uncomplicated: Secondary | ICD-10-CM

## 2017-03-06 DIAGNOSIS — I6930 Unspecified sequelae of cerebral infarction: Secondary | ICD-10-CM

## 2017-03-06 DIAGNOSIS — D62 Acute posthemorrhagic anemia: Secondary | ICD-10-CM

## 2017-03-06 DIAGNOSIS — I63532 Cerebral infarction due to unspecified occlusion or stenosis of left posterior cerebral artery: Secondary | ICD-10-CM

## 2017-03-06 DIAGNOSIS — I69322 Dysarthria following cerebral infarction: Secondary | ICD-10-CM

## 2017-03-06 DIAGNOSIS — I10 Essential (primary) hypertension: Secondary | ICD-10-CM

## 2017-03-06 LAB — VAS US CAROTID
LCCADDIAS: 18 cm/s
LEFT ECA DIAS: 21 cm/s
LEFT VERTEBRAL DIAS: 12 cm/s
LICADDIAS: -16 cm/s
LICADSYS: -90 cm/s
LICAPDIAS: 28 cm/s
LICAPSYS: 94 cm/s
Left CCA dist sys: 66 cm/s
Left CCA prox dias: 25 cm/s
Left CCA prox sys: 83 cm/s
RIGHT ECA DIAS: 19 cm/s
RIGHT VERTEBRAL DIAS: 8 cm/s
Right CCA prox dias: 17 cm/s
Right CCA prox sys: 65 cm/s
Right cca dist sys: -83 cm/s

## 2017-03-06 MED ORDER — LORAZEPAM 1 MG PO TABS
1.0000 mg | ORAL_TABLET | Freq: Two times a day (BID) | ORAL | Status: DC | PRN
  Administered 2017-03-06: 1 mg via ORAL
  Filled 2017-03-06: qty 1

## 2017-03-06 NOTE — Care Management Important Message (Signed)
Important Message  Patient Details  Name: Andrea Kennedy MRN: [id number] Date of Birth: [birth year]   Medicare Important Message Given:  Yes    [name] [name] 03/06/2017, 9:30 AM

## 2017-03-06 NOTE — Progress Notes (Signed)
I spoke with pt at bedside to discuss rehab venue options. Pt previously admitted to inpt rehab 07/2016 and returned home with her spouse and mother in law. She would not go home with her daughter who offered. Pt requested me to contact her daughter to discuss. I called Dianne and she requests SNF rehab, like at [facility] where she went 05/2015. I will contact SW and RN CM of this request. We will sign off at this time. [phone]

## 2017-03-06 NOTE — Progress Notes (Signed)
Occupational Therapy Treatment Patient Details Name: Andrea Kennedy MRN: [id number] DOB: [birth year] Today's Date: 03/06/2017    History of present illness 71 yo female with onset of stroke affecting L frontal lobe, unable to give tPA.  Has fallen, dizziness, AKI.  PMHx:  Wallerian degeneration, L5 compression fracture, L3-4 stenosis, encephalomalacia, PAF, R THA.  Presents with R hemiparesis and ankle in PF tone, unable to take a step on RLE.   OT comments  Pt with conflicting report about how much use she had of her R hand prior to this admission. Husband brought her resting hand splint to hospital, donned after PROM. Performed entire ADL with set up to total assist and transferred to chair with moderate assistance. Will continue to follow.  Follow Up Recommendations  CIR    Equipment Recommendations       Recommendations for Other Services      Precautions / Restrictions Precautions Precautions: Fall Restrictions Weight Bearing Restrictions: No        Mobility Bed Mobility Overal bed mobility: Needs Assistance Bed Mobility: Supine to Sit     Supine to sit: Mod assist     General bed mobility comments: assist for R LE to EOB and to advance hips to EOB, assist to slide hips back into chair  Transfers Overall transfer level: Needs assistance Equipment used: 1 person hand held assist Transfers: Sit to/from Stand;Stand Pivot Transfers Sit to Stand: Mod assist Stand pivot transfers: Mod assist       General transfer comment: pt initially refusing OOB, willing to transfer to chair for linen change    Balance Overall balance assessment: Needs assistance   Sitting balance-Leahy Scale: Fair Sitting balance - Comments: static     Standing balance-Leahy Scale: Poor                             ADL either performed or assessed with clinical judgement   ADL Overall ADL's : Needs assistance/impaired Eating/Feeding: Set up Eating/Feeding Details (indicate  cue type and reason): assist to cut food, open containers Grooming: Wash/dry hands;Wash/dry face;Oral care;Brushing hair;Sitting;Minimal assistance   Upper Body Bathing: Moderate assistance   Lower Body Bathing: Maximal assistance   Upper Body Dressing : Moderate assistance   Lower Body Dressing: Total assistance                       Vision       Perception     Praxis      Cognition Arousal/Alertness: Awake/alert Behavior During Therapy: WFL for tasks assessed/performed Overall Cognitive Status: Impaired/Different from baseline Area of Impairment: Memory                     Memory: Decreased short-term memory         General Comments: some difficulty relaying medical history, pt perseverating on how her husband is stressed with caring for pt and her mother in law        Exercises Other Exercises Other Exercises: PROM R UE and donned resting hand splint from home   Shoulder Instructions       General Comments      Pertinent Vitals/ Pain       Pain Assessment: No/denies pain  Home Living  Prior Functioning/Environment              Frequency  Min 2X/week        Progress Toward Goals  OT Goals(current goals can now be found in the care plan section)  Progress towards OT goals: Progressing toward goals  Acute Rehab OT Goals Patient Stated Goal: get better OT Goal Formulation: With patient Time For Goal Achievement: 03/11/17 Potential to Achieve Goals: Good  Plan Discharge plan remains appropriate    Co-evaluation                 AM-PAC PT "6 Clicks" Daily Activity     Outcome Measure   Help from another person eating meals?: A Little Help from another person taking care of personal grooming?: A Little Help from another person toileting, which includes using toliet, bedpan, or urinal?: A Lot Help from another person bathing (including washing, rinsing,  drying)?: A Lot Help from another person to put on and taking off regular upper body clothing?: A Lot Help from another person to put on and taking off regular lower body clothing?: Total 6 Click Score: 13    End of Session Equipment Utilized During Treatment: Gait belt  OT Visit Diagnosis: Pain;Hemiplegia and hemiparesis;Muscle weakness (generalized) (M62.81) Hemiplegia - Right/Left: Right Hemiplegia - dominant/non-dominant: Dominant Hemiplegia - caused by: Cerebral infarction   Activity Tolerance Patient tolerated treatment well   Patient Left in chair;with call bell/phone within reach;with chair alarm set   Nurse Communication Mobility status        Time: 3419-6222 OT Time Calculation (min): 48 min  Charges: OT General Charges $OT Visit: 1 Procedure OT Treatments $Self Care/Home Management : 38-52 mins   Malka So 03/06/2017, 12:49 PM [phone]

## 2017-03-06 NOTE — Progress Notes (Signed)
PT Cancellation Note  Patient Details Name: Andrea Kennedy MRN: [id number] DOB: 05/07/1946   Cancelled Treatment:    Reason Eval/Treat Not Completed: Fatigue/lethargy limiting ability to participate.  Pt is asleep and unable to awaken her, although her day has been busy.  In chair from OT visit and will try again tomorrow.   Ramond Dial 03/06/2017, 2:37 PM   Mee Hives, PT MS Acute Rehab Dept. Number: [place] and [place]
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
## 2017-03-06 NOTE — Care Management (Signed)
1142 03-06-17  Benefits Check Completed: Pt has not met her deductible and will need to meet the $171.55 deductible. CM was able to speak with Dr Leonie Man in regards to patient assistance application for Eliquis. Application filled out and placed on the shadow chart.  Pt will need assistance form in order to complete and submit to company once she is d/c. Pt will need to add tax forms to fax to company. CM will continue to monitor for additional disposition needs.   S/W [facility] [place] RX # [phone]   1.ELIQUIS  2.5 MG BID   COVER- YES  CO-PAY- $ 171.55  DEDUCTIBLE NOT MET  TIER- 3 DRUG  PRIOR APPROVAL- NO   2. ELIQUIS 5 MG BID   COVER- YES  SAME AS ABOVE   PHARMACY : [facility]   MAIL-ORDER FOR 90 DAY SUPPLY $ 255.55
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
## 2017-03-06 NOTE — Progress Notes (Addendum)
7/24 4:08pm: CSW faxing PT note to [facility] for discharge tomorrow.   7/24: Patient has been accepted by [facility]. CSW requested PT to see patient again to send to insurance for authorization.   7/23:Per CIR, patient prefers to discharge to SNF. Patient currently has no local bed offers. CSW to continue bed search.  Percell Locus Nana [place] LCSWA [phone]
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
## 2017-03-07 MED ORDER — DILTIAZEM HCL 30 MG PO TABS
30.0000 mg | ORAL_TABLET | Freq: Once | ORAL | Status: AC
  Administered 2017-03-07: 30 mg via ORAL
  Filled 2017-03-07: qty 1

## 2017-03-07 NOTE — Progress Notes (Signed)
  Speech Language Pathology Treatment: Cognitive-Linquistic  Patient Details Name: Andrea Kennedy MRN: [id number] DOB: [birth year] Today's Date: 03/07/2017 Time: 3810-1751 SLP Time Calculation (min) (ACUTE ONLY): 18 min  Assessment / Plan / Recommendation Clinical Impression  Pt stated "don't scream" when asked to state speech strategies to mitigate dysarthria. Introduced slow pace, over articulate and pause between words. She utilized strategies in conversation with 90% intelligibility. Encouraged her to use in louder environment and on phone. Will see while here.   HPI HPI: 71 yo female with onset of stroke affecting L frontal lobe, unable to give tPA.  Has fallen, dizziness, AKI.  PMHx:  Wallerian degeneration, L5 compression fracture, L3-4 stenosis, encephalomalacia, PAF, R THA. Previous admission to CIR after previous stroke, briefly seen by SLP in OP for dysarthria/verbal apraxia treatment.      SLP Plan  Continue with current plan of care       Recommendations                   Follow up Recommendations: Skilled Nursing facility SLP Visit Diagnosis: Dysarthria and anarthria (R47.1) Plan: Continue with current plan of care       GO                Houston Siren 03/07/2017, 11:21 AM   Orbie Pyo Colvin Caroli.Ed [employer] [phone]

## 2017-03-07 NOTE — Progress Notes (Signed)
PT Cancellation Note  Patient Details Name: Naveen Lorusso MRN: [id number] DOB: 1946/04/24   Cancelled Treatment:    Reason Eval/Treat Not Completed: Medical issues which prohibited therapy Pt's HR currently ranging from 120s to 140s at rest. Will reattempt as pt becomes medically appropriate.   Leighton Ruff, PT, DPT  Acute Rehabilitation Services  Pager: [phone]    Rudean Hitt 03/07/2017, 11:32 AM

## 2017-03-07 NOTE — Progress Notes (Signed)
Occupational Therapy Treatment Patient Details Name: Andrea Kennedy MRN: [id number] DOB: [birth year] Today's Date: 03/07/2017    History of present illness 71 yo female with onset of stroke affecting L frontal lobe, unable to give tPA.  Has fallen, dizziness, AKI.  PMHx:  Wallerian degeneration, L5 compression fracture, L3-4 stenosis, encephalomalacia, PAF, R THA.  Presents with R hemiparesis and ankle in PF tone, unable to take a step on RLE.   OT comments  Pt progressing towards goals and is motivated to work with therapy. Limited session due to Pt with increased HR at rest (RN made aware), therefore deferred EOB/OOB mobility. Pt completed bed level grooming ADLs with setup and PROM to RUE across all planes with therapist donning resting hand splint end of session. Will continue per POC and work towards EOB/OOB mobility and ADL completion as Pt is able.     Follow Up Recommendations  CIR (per chart review, Pt preferring SNF vs CIR )    Equipment Recommendations  Other (comment) (TBD at next venue of care )          Precautions / Restrictions Precautions Precautions: Fall Precaution Comments: watch HR  Restrictions Weight Bearing Restrictions: No Other Position/Activity Restrictions: functionally NWB on RLE       Mobility Bed Mobility               General bed mobility comments: EOB/OOB mobility deferred this session due to fluctuating HR                                                                    ADL either performed or assessed with clinical judgement   ADL       Grooming: Wash/dry face;Wash/dry hands;Set up;Bed level                                 General ADL Comments: Focus of session on bed level grooming ADLs and PROM to RUE, Pt with fluctuating HR, with HR increasing to low 130's at rest therefore EOB/OOB mobility deferred, overall HR remaining at 98-120's during session                        Cognition Arousal/Alertness: Awake/alert Behavior During Therapy: [facility] for tasks assessed/performed Overall Cognitive Status: Impaired/Different from baseline                                          Exercises General Exercises - Upper Extremity Shoulder Flexion: PROM;10 reps;Right;Supine Shoulder Extension: PROM;10 reps;Right;Supine Shoulder ABduction: PROM;10 reps;Right;Supine Shoulder ADduction: PROM;10 reps;Right;Supine Elbow Flexion: PROM;10 reps;Right;Supine Elbow Extension: PROM;10 reps;Right;Supine Wrist Flexion: PROM;10 reps;Right;Supine Wrist Extension: PROM;10 reps;Right;Supine Digit Composite Flexion: PROM;10 reps;Supine;Right Composite Extension: PROM;10 reps;Supine;Right                Pertinent Vitals/ Pain       Pain Assessment: No/denies pain  Frequency  Min 2X/week        Progress Toward Goals  OT Goals(current goals can now be found in the care plan section)  Progress towards OT goals: Progressing toward goals  Acute Rehab OT Goals Patient Stated Goal: get better OT Goal Formulation: With patient Time For Goal Achievement: 03/11/17 Potential to Achieve Goals: Good  Plan Discharge plan remains appropriate                     AM-PAC PT "6 Clicks" Daily Activity     Outcome Measure   Help from another person eating meals?: A Little Help from another person taking care of personal grooming?: A Little Help from another person toileting, which includes using toliet, bedpan, or urinal?: A Lot Help from another person bathing (including washing, rinsing, drying)?: A Lot Help from another person to put on and taking off regular upper body clothing?: A Lot Help from another person to put on and taking off regular lower body clothing?: Total 6 Click Score: 13    End of Session    OT Visit Diagnosis: Hemiplegia and hemiparesis;Muscle  weakness (generalized) (M62.81) Hemiplegia - Right/Left: Right Hemiplegia - dominant/non-dominant: Dominant Hemiplegia - caused by: Cerebral infarction   Activity Tolerance Patient tolerated treatment well   Patient Left in bed;with call bell/phone within reach   Nurse Communication Other (comment) (fluctuating HR, RN okay to work with Pt)        Time: 7867-5449 OT Time Calculation (min): 26 min  Charges: OT General Charges $OT Visit: 1 Procedure OT Treatments $Therapeutic Exercise: 8-22 mins  Lou Cal, OT Pager [phone] 03/07/2017    [name] 03/07/2017, 3:16 PM
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
## 2017-03-08 LAB — HEMOGLOBIN A1C
HEMOGLOBIN A1C: 5.3 % (ref 4.8–5.6)
Mean Plasma Glucose: 105 mg/dL

## 2017-03-08 MED ORDER — ATORVASTATIN CALCIUM 40 MG PO TABS: 40.0000 mg | ORAL_TABLET | Freq: Every day | ORAL | Status: DC

## 2017-03-08 MED ORDER — ASPIRIN 325 MG PO TABS: 325.0000 mg | ORAL_TABLET | Freq: Every day | ORAL | Status: DC

## 2017-03-08 MED ORDER — APIXABAN 5 MG PO TABS
5.0000 mg | ORAL_TABLET | Freq: Two times a day (BID) | ORAL | Status: DC
  Administered 2017-03-08: 5 mg via ORAL
  Filled 2017-03-08: qty 1

## 2017-03-08 NOTE — Progress Notes (Signed)
ANTICOAGULATION CONSULT NOTE - Initial Consult  Pharmacy Consult for apixaban Indication: atrial fibrillation  Allergies  Allergen Reactions  . Floxin [Ofloxacin] Other (See Comments)    Scared/shaky/pain attacks/confusion    Patient Measurements: Height: 5\' 9"  (175.3 cm) Weight: 132 lb 12.8 oz (60.2 kg) IBW/kg (Calculated) : 66.2  Vital Signs: Temp: 98 F (36.7 C) (07/25 1213) Temp Source: Oral (07/25 1213) BP: 124/61 (07/25 1213) Pulse Rate: 54 (07/25 1213)  Labs: No results for input(s): HGB, HCT, PLT, APTT, LABPROT, INR, HEPARINUNFRC, HEPRLOWMOCWT, CREATININE, CKTOTAL, CKMB, TROPONINI in the last 72 hours.  Estimated Creatinine Clearance: 55.4 mL/min (by C-G formula based on SCr of 0.91 mg/dL).   Medical History: Past Medical History:  Diagnosis Date  . Hyperlipidemia   . Lupus   . Sciatica of right side   . Stroke ([place])   . Tobacco abuse      Assessment: Pharmacy has been consulted to restart apixaban for afib. Patient has hx of stroke in Dec 2017 thought to be secondary to paroxsymal atrial fibrillation. She was started on apixaban, however has not been taking for the last 2 months d/t financial reasons.   Goal of Therapy:  Anticoagulation Monitor platelets by anticoagulation protocol: Yes   Plan:  Start apixaban 5 mg PO twice daily Monitor CBC, renal function. Pt has been given a patient assistance form and will discharge to SNF   Thank you for allowing [place] to participate in this patients care.  Jens Som, PharmD Clinical phone for 03/08/2017 from 7a-3:30p: [phone] If after 3:30p, please call main pharmacy at: [phone] 03/08/2017 12:32 PM
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
## 2017-03-08 NOTE — Progress Notes (Signed)
Clinical Social Worker facilitated patient discharge including contacting patient family and facility to confirm patient discharge plans.  Clinical information faxed to facility and family agreeable with plan.  Family will pick up patient and take her to [facility] .  RN Claiborne Billings to call [phone] (pt will be placed in rm# [id number]) report prior to discharge.  Clinical Social Worker will sign off for now as social work intervention is no longer needed. Please consult [place] again if new need arises.  Andrea Kennedy, MSW, [place]
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
## 2017-03-08 NOTE — Discharge Instructions (Signed)

## 2017-03-09 ENCOUNTER — Encounter: Payer: Self-pay | Admitting: Speech Pathology
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
## 2017-03-10 ENCOUNTER — Telehealth: Payer: Self-pay | Admitting: *Deleted
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
## 2017-04-26 ENCOUNTER — Ambulatory Visit: Payer: Self-pay | Admitting: Neurology

## 2017-04-27 ENCOUNTER — Encounter: Payer: Self-pay | Admitting: Neurology

## 2017-05-05 ENCOUNTER — Telehealth: Payer: Self-pay | Admitting: Family

## 2017-05-05 NOTE — Telephone Encounter (Signed)
Notified Darlene w/Greg response.Marland KitchenJohny Chess

## 2017-05-05 NOTE — Telephone Encounter (Signed)
Chart reviewed and appropriate for OT, PT, social work and nursing.

## 2017-05-05 NOTE — Telephone Encounter (Signed)
[facility] [phone] or [place]     Need verbal for home health, OT, PT, soical worker and nursing

## 2017-05-09 ENCOUNTER — Encounter: Payer: Self-pay | Admitting: Family

## 2017-05-09 ENCOUNTER — Ambulatory Visit (INDEPENDENT_AMBULATORY_CARE_PROVIDER_SITE_OTHER): Payer: Medicare HMO | Admitting: Family

## 2017-05-09 ENCOUNTER — Telehealth: Payer: Self-pay | Admitting: Family

## 2017-05-09 VITALS — BP 138/90 | HR 112 | Temp 99.3°F | Resp 16 | Ht 69.0 in | Wt 123.4 lb

## 2017-05-09 DIAGNOSIS — I10 Essential (primary) hypertension: Secondary | ICD-10-CM | POA: Diagnosis not present

## 2017-05-09 DIAGNOSIS — R252 Cramp and spasm: Secondary | ICD-10-CM

## 2017-05-09 DIAGNOSIS — K2100 Gastro-esophageal reflux disease with esophagitis, without bleeding: Secondary | ICD-10-CM | POA: Insufficient documentation

## 2017-05-09 DIAGNOSIS — I480 Paroxysmal atrial fibrillation: Secondary | ICD-10-CM

## 2017-05-09 DIAGNOSIS — G8929 Other chronic pain: Secondary | ICD-10-CM | POA: Diagnosis not present

## 2017-05-09 DIAGNOSIS — M25511 Pain in right shoulder: Secondary | ICD-10-CM | POA: Diagnosis not present

## 2017-05-09 DIAGNOSIS — I69320 Aphasia following cerebral infarction: Secondary | ICD-10-CM

## 2017-05-09 MED ORDER — PANTOPRAZOLE SODIUM 40 MG PO TBEC: 40.0000 mg | DELAYED_RELEASE_TABLET | Freq: Every day | ORAL | 1 refills | Status: DC

## 2017-05-09 MED ORDER — HYDROCODONE-ACETAMINOPHEN 5-325 MG PO TABS: 1.0000 | ORAL_TABLET | Freq: Three times a day (TID) | ORAL | 0 refills | Status: DC | PRN

## 2017-05-09 MED ORDER — DULOXETINE HCL 30 MG PO CPEP: 30.0000 mg | ORAL_CAPSULE | Freq: Every day | ORAL | 2 refills | Status: DC

## 2017-05-09 MED ORDER — APIXABAN 5 MG PO TABS: 5.0000 mg | ORAL_TABLET | Freq: Two times a day (BID) | ORAL | 1 refills | Status: AC

## 2017-05-09 NOTE — Assessment & Plan Note (Signed)
Regular rate and rhythm noted today. Anticoagulated with Eliquis. Has concern for cost of medication as she is not able to afford. No samples are available today. Encouraged to contact pharmaceutical company to apply for assistance. May need to consider Warfarin. Continue current dosage of Eliquis.

## 2017-05-09 NOTE — Assessment & Plan Note (Signed)
Stable and continues to have expressive aphasia and apraxia. Able to communicate well. Continue to monitor.

## 2017-05-09 NOTE — Assessment & Plan Note (Signed)
Continues to experience pain located in her right shoulder with increased spasticity. Encouraged to continue methocarbomal for spasms. Discussed pain management regimen and encouraged to non-pharmacological treatment include ice, heat, and OTC creams. She is potential for increased risk for opioid abuse given previous cocaine usage. 5 day supply of hydrocodone provided with no further narcotic medication being prescribed. Start Cymbalta. If symptoms do not improve consider referral to orthopedics or pain management.

## 2017-05-09 NOTE — Assessment & Plan Note (Signed)
Stable with current medication regimen and no adverse side effects. Continue current dosage of pantoprazole.

## 2017-05-09 NOTE — Telephone Encounter (Signed)
Notified [name] w/Greg response...Andrea Kennedy

## 2017-05-09 NOTE — Patient Instructions (Signed)
Thank you for choosing [facility].  SUMMARY AND INSTRUCTIONS:  Please continue to take your medications as prescribed.   Use the hydrocodone as needed for breakthrough pain. If additional pain medication is required we will refer you to pain management.   Start the Cymbalta (duloxetine) for your anxiety and pains.  Continue with ice, heat, and over the counter rubs to help with your pain.  Medication:  Your prescription(s) have been submitted to your pharmacy or been printed and provided for you. Please take as directed and contact our office if you believe you are having problem(s) with the medication(s) or have any questions.  Labs:  Please stop by the lab on the lower level of the building for your blood work. Your results will be released to [facility] (or called to you) after review, usually within 72 hours after test completion. If any changes need to be made, you will be notified at that same time.  1.) The lab is open from 7:30am to 5:30 pm Monday-Friday 2.) No appointment is necessary 3.) Fasting (if needed) is 6-8 hours after food and drink; black coffee and water are okay   Follow up:  If your symptoms worsen or fail to improve, please contact our office for further instruction, or in case of emergency go directly to the emergency room at the closest medical facility.

## 2017-05-09 NOTE — Assessment & Plan Note (Signed)
Blood pressure reasonable controlled with no complications. Continue with lifestyle management and encouraged to monitor blood pressure at home and follow a low sodium diet.

## 2017-05-09 NOTE — Progress Notes (Signed)
Subjective:    Patient ID: Andrea Kennedy, female    DOB: November 21, 1945, 71 y.o.   MRN: [id number]  Chief Complaint  Patient presents with  . Medication Refill    refills and something for nerves having family problems    HPI:  Andrea Kennedy is a 71 y.o. female who  has a past medical history of Hyperlipidemia; Lupus; Sciatica of right side; Stroke ([place]); and Tobacco abuse. and presents today for a follow up office visit.  1.) GERD -Currently maintained on pantoprazole. Reports taking the medication as prescribed and denies adverse side effects. Symptoms are generally well controlled with the current medication regimen.   2.) Anxiety - This is a new problem. Associated symptom of increased levels of anxiety secondary to the recent passing of her mother-in-law and losing her home as family removed her from her current residence. She has a plan to live in a hotel, however family continues to pressure her to move. Modifying factors include deep breathing. Has been on medications in the past about 12 years ago and believes she was   3.) Pain - Continues to experience the associated symptom of pain located in her bilateral shoulders and her lower back. Previously prescribed oxycodone when she was in rehabilitation that she would take to help with therapy. She has 1 tablet left from her 3 day prescription about 1 month ago. Indicates previously prescribed Tramadol was like taking candy and had no significant improvement in her symptoms. She is able to function on a daily basis with the current medication taken as needed.    Allergies  Allergen Reactions  . Floxin [Ofloxacin] Other (See Comments)    Scared/shaky/pain attacks/confusion      Outpatient Medications Prior to Visit  Medication Sig Dispense Refill  . acetaminophen (TYLENOL) 325 MG tablet Take 650 mg by mouth every 6 (six) hours as needed (for headache.).    Marland Kitchen aspirin 325 MG tablet Take 1 tablet (325 mg total) by mouth daily.    Marland Kitchen  atorvastatin (LIPITOR) 40 MG tablet Take 1 tablet (40 mg total) by mouth daily at 6 PM.    . docusate sodium (COLACE) 100 MG capsule Take 1 capsule (100 mg total) by mouth 2 (two) times daily. (Patient taking differently: Take 100 mg by mouth 2 (two) times daily as needed for mild constipation. ) 10 capsule 0  . methocarbamol (ROBAXIN) 500 MG tablet Take 1 tablet (500 mg total) by mouth every 6 (six) hours as needed for muscle spasms. 60 tablet 0  . Multiple Vitamins-Minerals (ALIVE WOMENS 50+ PO) Take 1 tablet by mouth daily.    . vitamin B-12 1000 MCG tablet Take 1 tablet (1,000 mcg total) by mouth daily. 30 tablet 0  . apixaban (ELIQUIS) 5 MG TABS tablet Take 1 tablet (5 mg total) by mouth 2 (two) times daily. 60 tablet 0  . pantoprazole (PROTONIX) 40 MG tablet Take 1 tablet (40 mg total) by mouth daily. 30 tablet 1   No facility-administered medications prior to visit.       Past Surgical History:  Procedure Laterality Date  . TOTAL HIP ARTHROPLASTY Right 06/03/2015   Procedure: RIGHT TOTAL HIP ARTHROPLASTY ANTERIOR APPROACH;  Surgeon: Gaynelle Arabian, MD;  Location: [facility];  Service: Orthopedics;  Laterality: Right;  . TUBAL LIGATION        Past Medical History:  Diagnosis Date  . Hyperlipidemia   . Lupus   . Sciatica of right side   . Stroke ([place])   .  Tobacco abuse       Review of Systems  Constitutional: Negative for chills and fever.  Respiratory: Negative for chest tightness and shortness of breath.   Cardiovascular: Negative for chest pain, palpitations and leg swelling.  Gastrointestinal: Negative for abdominal pain, blood in stool, constipation, diarrhea, nausea and vomiting.  Musculoskeletal:       Positive for shoulder and back pain.  Neurological: Negative for weakness and numbness.      Objective:    BP 138/90 (BP Location: Left Arm, Patient Position: Sitting, Cuff Size: Normal)   Pulse (!) 112   Temp 99.3 F (37.4 C) (Oral)   Resp 16   Ht 5\' 9"  (1.753  m)   Wt 123 lb 6.4 oz (56 kg)   SpO2 97%   BMI 18.22 kg/m  Nursing note and vital signs reviewed.  Physical Exam  Constitutional: She is oriented to person, place, and time. She appears well-developed and well-nourished. No distress.  Cardiovascular: Normal rate, regular rhythm, normal heart sounds and intact distal pulses.   Pulmonary/Chest: Effort normal and breath sounds normal.  Neurological: She is alert and oriented to person, place, and time.  Aphasia and apraxia. Does have shoulder and back pains.   Skin: Skin is warm and dry.  Psychiatric: She has a normal mood and affect. Her behavior is normal. Judgment and thought content normal.       Assessment & Plan:   Problem List Items Addressed This Visit      Cardiovascular and Mediastinum   Benign essential HTN    Blood pressure reasonable controlled with no complications. Continue with lifestyle management and encouraged to monitor blood pressure at home and follow a low sodium diet.       Relevant Medications   apixaban (ELIQUIS) 5 MG TABS tablet   PAF (paroxysmal atrial fibrillation) (HCC)    Regular rate and rhythm noted today. Anticoagulated with Eliquis. Has concern for cost of medication as she is not able to afford. No samples are available today. Encouraged to contact pharmaceutical company to apply for assistance. May need to consider Warfarin. Continue current dosage of Eliquis.       Relevant Medications   apixaban (ELIQUIS) 5 MG TABS tablet     Digestive   Gastroesophageal reflux disease with esophagitis - Primary    Stable with current medication regimen and no adverse side effects. Continue current dosage of pantoprazole.         Other   Spasticity   Aphasia as late effect of stroke    Stable and continues to have expressive aphasia and apraxia. Able to communicate well. Continue to monitor.       Chronic right shoulder pain    Continues to experience pain located in her right shoulder with  increased spasticity. Encouraged to continue methocarbomal for spasms. Discussed pain management regimen and encouraged to non-pharmacological treatment include ice, heat, and OTC creams. She is potential for increased risk for opioid abuse given previous cocaine usage. 5 day supply of hydrocodone provided with no further narcotic medication being prescribed. Start Cymbalta. If symptoms do not improve consider referral to orthopedics or pain management.       Relevant Medications   DULoxetine (CYMBALTA) 30 MG capsule   HYDROcodone-acetaminophen (NORCO/VICODIN) 5-325 MG tablet       I am having Ms. Porrata start on DULoxetine and HYDROcodone-acetaminophen. I am also having her maintain her acetaminophen, Multiple Vitamins-Minerals (ALIVE WOMENS 50+ PO), docusate sodium, methocarbamol, cyanocobalamin, aspirin, atorvastatin, apixaban, and pantoprazole.  Meds ordered this encounter  Medications  . DULoxetine (CYMBALTA) 30 MG capsule    Sig: Take 1 capsule (30 mg total) by mouth daily.    Dispense:  30 capsule    Refill:  2    Order Specific Question:   Supervising Provider    Answer:   Pricilla Holm A [[id number]]  . HYDROcodone-acetaminophen (NORCO/VICODIN) 5-325 MG tablet    Sig: Take 1 tablet by mouth every 8 (eight) hours as needed for moderate pain.    Dispense:  15 tablet    Refill:  0    Order Specific Question:   Supervising Provider    Answer:   Pricilla Holm A [[id number]]  . apixaban (ELIQUIS) 5 MG TABS tablet    Sig: Take 1 tablet (5 mg total) by mouth 2 (two) times daily.    Dispense:  60 tablet    Refill:  1    Order Specific Question:   Supervising Provider    Answer:   Pricilla Holm A [[id number]]  . pantoprazole (PROTONIX) 40 MG tablet    Sig: Take 1 tablet (40 mg total) by mouth daily.    Dispense:  30 tablet    Refill:  1    Order Specific Question:   Supervising Provider    Answer:   Pricilla Holm A [[id number]]     Follow-up: Return in about 3 months  (around 08/08/2017), or if symptoms worsen or fail to improve.  Mauricio Po, FNP

## 2017-05-09 NOTE — Telephone Encounter (Signed)
Chart reviewed and okay to continue physical therapy.

## 2017-05-09 NOTE — Telephone Encounter (Signed)
Needs verbals for physical therapy 1week1  2week4

## 2017-05-25 DIAGNOSIS — I10 Essential (primary) hypertension: Secondary | ICD-10-CM | POA: Diagnosis not present

## 2017-05-25 DIAGNOSIS — M1990 Unspecified osteoarthritis, unspecified site: Secondary | ICD-10-CM | POA: Diagnosis not present

## 2017-05-25 DIAGNOSIS — F1410 Cocaine abuse, uncomplicated: Secondary | ICD-10-CM | POA: Diagnosis not present

## 2017-05-25 DIAGNOSIS — Z7901 Long term (current) use of anticoagulants: Secondary | ICD-10-CM | POA: Diagnosis not present

## 2017-05-25 DIAGNOSIS — Z96641 Presence of right artificial hip joint: Secondary | ICD-10-CM

## 2017-05-25 DIAGNOSIS — M328 Other forms of systemic lupus erythematosus: Secondary | ICD-10-CM | POA: Diagnosis not present

## 2017-05-25 DIAGNOSIS — R32 Unspecified urinary incontinence: Secondary | ICD-10-CM | POA: Diagnosis not present

## 2017-05-25 DIAGNOSIS — I69351 Hemiplegia and hemiparesis following cerebral infarction affecting right dominant side: Secondary | ICD-10-CM | POA: Diagnosis not present

## 2017-05-25 DIAGNOSIS — I69322 Dysarthria following cerebral infarction: Secondary | ICD-10-CM | POA: Diagnosis not present

## 2017-05-25 DIAGNOSIS — I480 Paroxysmal atrial fibrillation: Secondary | ICD-10-CM | POA: Diagnosis not present

## 2017-05-25 DIAGNOSIS — D649 Anemia, unspecified: Secondary | ICD-10-CM | POA: Diagnosis not present

## 2017-05-25 DIAGNOSIS — E7800 Pure hypercholesterolemia, unspecified: Secondary | ICD-10-CM | POA: Diagnosis not present

## 2017-05-25 DIAGNOSIS — Z9181 History of falling: Secondary | ICD-10-CM

## 2017-05-25 DIAGNOSIS — Z79891 Long term (current) use of opiate analgesic: Secondary | ICD-10-CM

## 2017-05-25 DIAGNOSIS — I69320 Aphasia following cerebral infarction: Secondary | ICD-10-CM | POA: Diagnosis not present

## 2017-10-24 DIAGNOSIS — Z8673 Personal history of transient ischemic attack (TIA), and cerebral infarction without residual deficits: Secondary | ICD-10-CM | POA: Diagnosis not present

## 2017-10-24 DIAGNOSIS — K219 Gastro-esophageal reflux disease without esophagitis: Secondary | ICD-10-CM | POA: Diagnosis not present

## 2017-10-24 DIAGNOSIS — E782 Mixed hyperlipidemia: Secondary | ICD-10-CM | POA: Diagnosis not present

## 2017-10-24 DIAGNOSIS — Z720 Tobacco use: Secondary | ICD-10-CM | POA: Diagnosis not present

## 2017-10-24 DIAGNOSIS — I10 Essential (primary) hypertension: Secondary | ICD-10-CM | POA: Diagnosis not present

## 2017-10-24 DIAGNOSIS — E538 Deficiency of other specified B group vitamins: Secondary | ICD-10-CM | POA: Diagnosis not present

## 2017-10-24 DIAGNOSIS — I480 Paroxysmal atrial fibrillation: Secondary | ICD-10-CM | POA: Diagnosis not present

## 2017-10-24 DIAGNOSIS — R6889 Other general symptoms and signs: Secondary | ICD-10-CM | POA: Diagnosis not present

## 2018-01-17 DIAGNOSIS — R6889 Other general symptoms and signs: Secondary | ICD-10-CM | POA: Diagnosis not present

## 2018-01-17 DIAGNOSIS — Z8673 Personal history of transient ischemic attack (TIA), and cerebral infarction without residual deficits: Secondary | ICD-10-CM | POA: Diagnosis not present

## 2018-01-17 DIAGNOSIS — E782 Mixed hyperlipidemia: Secondary | ICD-10-CM | POA: Diagnosis not present

## 2018-01-29 DIAGNOSIS — E538 Deficiency of other specified B group vitamins: Secondary | ICD-10-CM | POA: Diagnosis not present

## 2018-01-29 DIAGNOSIS — I10 Essential (primary) hypertension: Secondary | ICD-10-CM | POA: Diagnosis not present

## 2018-01-29 DIAGNOSIS — Z8673 Personal history of transient ischemic attack (TIA), and cerebral infarction without residual deficits: Secondary | ICD-10-CM | POA: Diagnosis not present

## 2018-01-29 DIAGNOSIS — I480 Paroxysmal atrial fibrillation: Secondary | ICD-10-CM | POA: Diagnosis not present

## 2018-01-29 DIAGNOSIS — Z720 Tobacco use: Secondary | ICD-10-CM | POA: Diagnosis not present

## 2018-01-29 DIAGNOSIS — K219 Gastro-esophageal reflux disease without esophagitis: Secondary | ICD-10-CM | POA: Diagnosis not present

## 2018-01-29 DIAGNOSIS — E782 Mixed hyperlipidemia: Secondary | ICD-10-CM | POA: Diagnosis not present

## 2018-01-29 DIAGNOSIS — M329 Systemic lupus erythematosus, unspecified: Secondary | ICD-10-CM | POA: Diagnosis not present

## 2018-03-10 ENCOUNTER — Emergency Department: Payer: Medicare HMO

## 2018-03-10 ENCOUNTER — Encounter: Payer: Self-pay | Admitting: Emergency Medicine

## 2018-03-10 ENCOUNTER — Other Ambulatory Visit: Payer: Self-pay

## 2018-03-10 ENCOUNTER — Emergency Department
Admission: EM | Admit: 2018-03-10 | Discharge: 2018-03-10 | Disposition: A | Discharge status: Home / Self Care | Payer: Medicare HMO | Attending: Emergency Medicine | Admitting: Emergency Medicine

## 2018-03-10 DIAGNOSIS — W010XXA Fall on same level from slipping, tripping and stumbling without subsequent striking against object, initial encounter: Secondary | ICD-10-CM | POA: Diagnosis not present

## 2018-03-10 DIAGNOSIS — Y93K1 Activity, walking an animal: Secondary | ICD-10-CM | POA: Insufficient documentation

## 2018-03-10 DIAGNOSIS — Z8673 Personal history of transient ischemic attack (TIA), and cerebral infarction without residual deficits: Secondary | ICD-10-CM | POA: Insufficient documentation

## 2018-03-10 DIAGNOSIS — Z7901 Long term (current) use of anticoagulants: Secondary | ICD-10-CM | POA: Diagnosis not present

## 2018-03-10 DIAGNOSIS — Z79899 Other long term (current) drug therapy: Secondary | ICD-10-CM | POA: Insufficient documentation

## 2018-03-10 DIAGNOSIS — I10 Essential (primary) hypertension: Secondary | ICD-10-CM | POA: Diagnosis not present

## 2018-03-10 DIAGNOSIS — Y929 Unspecified place or not applicable: Secondary | ICD-10-CM | POA: Diagnosis not present

## 2018-03-10 DIAGNOSIS — Y999 Unspecified external cause status: Secondary | ICD-10-CM | POA: Diagnosis not present

## 2018-03-10 DIAGNOSIS — F17210 Nicotine dependence, cigarettes, uncomplicated: Secondary | ICD-10-CM | POA: Insufficient documentation

## 2018-03-10 DIAGNOSIS — M25512 Pain in left shoulder: Secondary | ICD-10-CM | POA: Diagnosis not present

## 2018-03-10 DIAGNOSIS — S0990XA Unspecified injury of head, initial encounter: Secondary | ICD-10-CM | POA: Diagnosis not present

## 2018-03-10 DIAGNOSIS — S161XXA Strain of muscle, fascia and tendon at neck level, initial encounter: Secondary | ICD-10-CM | POA: Insufficient documentation

## 2018-03-10 DIAGNOSIS — S4992XA Unspecified injury of left shoulder and upper arm, initial encounter: Secondary | ICD-10-CM | POA: Diagnosis not present

## 2018-03-10 DIAGNOSIS — W19XXXA Unspecified fall, initial encounter: Secondary | ICD-10-CM

## 2018-03-10 DIAGNOSIS — S199XXA Unspecified injury of neck, initial encounter: Secondary | ICD-10-CM | POA: Diagnosis not present

## 2018-03-10 HISTORY — DX: Essential (primary) hypertension: I10

## 2018-03-10 MED ORDER — HYDROCODONE-ACETAMINOPHEN 5-325 MG PO TABS
1.0000 | ORAL_TABLET | Freq: Once | ORAL | Status: AC
  Administered 2018-03-10: 1 via ORAL
  Filled 2018-03-10: qty 1

## 2018-03-10 NOTE — Discharge Instructions (Signed)
Follow-up with your regular doctor if not better in 3 to 5 days.  Take Tylenol for pain as needed.  Apply ice to all areas that hurt.

## 2018-03-10 NOTE — ED Provider Notes (Signed)
[facility] Emergency Department Provider Note  ____________________________________________   First MD Initiated Contact with Patient 03/10/18 2009     (approximate)  I have reviewed the triage vital signs and the nursing notes.   HISTORY  Chief Complaint Shoulder Pain    HPI Andrea Kennedy is a 72 y.o. female presents emergency department after a fall.  She states that she got tangled in a dog leash and fell causing her to fall on her left shoulder.  She is complaining of neck pain left shoulder pain and she is also on Eliquis.  She denies any loss of consciousness.  She denies any other injuries.  Patient has had a stroke in the past and this is why she takes Eliquis  Past Medical History:  Diagnosis Date  . Hypertension   . Lupus ([place])   . Stroke [facility])     There are no active problems to display for this patient.     Prior to Admission medications   Medication Sig Start Date End Date Taking? Authorizing Provider  apixaban (ELIQUIS) 5 MG TABS tablet Take 5 mg by mouth 2 (two) times daily. 01/29/18  Yes [provider]  atorvastatin (LIPITOR) 40 MG tablet Take 40 mg by mouth daily. 10/24/17  Yes [provider]  HYDROcodone-acetaminophen (NORCO/VICODIN) 5-325 MG tablet Take 5-325 tablets by mouth 2 (two) times daily as needed. 01/29/18  Yes [provider]  pantoprazole (PROTONIX) 40 MG tablet Take 40 mg by mouth daily. 10/24/17  Yes [provider]    Allergies Levaquin [levofloxacin in d5w]  History reviewed. No pertinent family history.  Social History Social History   Tobacco Use  . Smoking status: Current Every Day Smoker    Packs/day: 1.00    Types: Cigarettes  . Smokeless tobacco: Never Used  Substance Use Topics  . Alcohol use: Yes  . Drug use: Never    Review of Systems  Constitutional: No fever/chills Eyes: No visual changes. ENT: No sore throat. Respiratory: Denies  cough Genitourinary: Negative for dysuria. Musculoskeletal: Negative for back pain.  Positive for neck and left shoulder pain Skin: Negative for rash.    ____________________________________________   PHYSICAL EXAM:  VITAL SIGNS: ED Triage Vitals [03/10/18 1904]  Enc Vitals Group     BP (!) 151/80     Pulse Rate 69     Resp 18     Temp 98.9 F (37.2 C)     Temp src      SpO2 99 %     Weight 127 lb (57.6 kg)     Height 5\' 8"  (1.727 m)     Head Circumference      Peak Flow      Pain Score 10     Pain Loc      Pain Edu?      Excl. in [place]?     Constitutional: Alert and oriented. Well appearing and in no acute distress. Eyes: Conjunctivae are normal.  Head: Atraumatic. Nose: No congestion/rhinnorhea. Mouth/Throat: Mucous membranes are moist.   Neck: Is supple, no lymphadenopathy is noted Cardiovascular: Normal rate, regular rhythm. Respiratory: Normal respiratory effort.  No retractions GU: deferred Musculoskeletal: C-spine is tender.  Left shoulder is tender.  Decreased range of motion of the left shoulder is noted.  Neurovascular is intact Neurologic: Some change in speech with paralysis of the right side of the face from her previous stroke Skin:  Skin is warm, dry and intact. No rash noted. Psychiatric: Mood  and affect are normal. Speech and behavior are normal.  ____________________________________________   LABS (all labs ordered are listed, but only abnormal results are displayed)  Labs Reviewed - No data to display ____________________________________________   ____________________________________________  RADIOLOGY  X-ray of the left shoulder is negative CT of the head and C-spine are negative for any acute abnormalities  ____________________________________________   PROCEDURES  Procedure(s) performed: Hydrocodone 5/325 1 p.o. was given to the patient.  Procedures    ____________________________________________   INITIAL IMPRESSION /  ASSESSMENT AND PLAN / ED COURSE  Pertinent labs & imaging results that were available during my care of the patient were reviewed by me and considered in my medical decision making (see chart for details).  Patient is 72 year old female presents emergency department after a fall landing on the left shoulder.  She tripped over a dog leash.  She is complaining of neck pain and left shoulder pain.  She is also on Eliquis  On physical exam the patient has the remains of a previous stroke.  She has some difficulty with her speech.  The C-spine is tender, the left shoulder is tender.  Decreased range of motion of the left shoulder is noted.  The remainder the exam is unremarkable  X-ray of the left shoulder is negative CT of the head and C-spine are negative for any acute abnormalities  Discussed all of the results with the patient and her family member.  She was given a sling for the left shoulder pain.  She was given 1 hydrocodone while here in the emergency department.  She is to apply ice to the extremity for pain.  She is to follow-up with her regular doctor if not improving in 5 7 days or orthopedics.  She states she understands will comply with our instructions.  She was discharged in stable condition.     As part of my medical decision making, I reviewed the following data within the [place] History obtained from family, Nursing notes reviewed and incorporated, Old chart reviewed, Radiograph reviewed x-ray of the left shoulder is negative, CT of the head and C-spine are negative for any acute abnormality, Notes from prior ED visits and [place] Controlled Substance Database  ____________________________________________   FINAL CLINICAL IMPRESSION(S) / ED DIAGNOSES  Final diagnoses:  Fall, initial encounter  Acute pain of left shoulder  Strain of neck muscle, initial encounter      NEW MEDICATIONS STARTED DURING THIS VISIT:  There are no discharge medications for this  patient.    Note:  This document was prepared using Dragon voice recognition software and may include unintentional dictation errors.    Versie Starks, PA-C 03/10/18 2244    Carrie Mew, MD 03/12/18 [phone]

## 2018-03-10 NOTE — ED Triage Notes (Signed)
Pt states she was tangled in a dog leash, causing her to fall on left shoulder. Cms intact to left fingers. Pt complains of anterior and lateral left shoulder pain. No obvious deformity noted.

## 2018-03-12 ENCOUNTER — Encounter: Payer: Self-pay | Admitting: Family

## 2018-03-14 DIAGNOSIS — M25512 Pain in left shoulder: Secondary | ICD-10-CM | POA: Diagnosis not present

## 2018-04-23 ENCOUNTER — Emergency Department
Admission: EM | Admit: 2018-04-23 | Discharge: 2018-04-23 | Disposition: A | Discharge status: Home / Self Care | Payer: Medicare HMO | Attending: Emergency Medicine | Admitting: Emergency Medicine

## 2018-04-23 DIAGNOSIS — Z7982 Long term (current) use of aspirin: Secondary | ICD-10-CM | POA: Insufficient documentation

## 2018-04-23 DIAGNOSIS — F17210 Nicotine dependence, cigarettes, uncomplicated: Secondary | ICD-10-CM | POA: Insufficient documentation

## 2018-04-23 DIAGNOSIS — I10 Essential (primary) hypertension: Secondary | ICD-10-CM | POA: Insufficient documentation

## 2018-04-23 DIAGNOSIS — Z96641 Presence of right artificial hip joint: Secondary | ICD-10-CM | POA: Diagnosis not present

## 2018-04-23 DIAGNOSIS — Z79899 Other long term (current) drug therapy: Secondary | ICD-10-CM | POA: Insufficient documentation

## 2018-04-23 DIAGNOSIS — R531 Weakness: Secondary | ICD-10-CM | POA: Diagnosis present

## 2018-04-23 DIAGNOSIS — R5383 Other fatigue: Secondary | ICD-10-CM | POA: Insufficient documentation

## 2018-04-23 DIAGNOSIS — D649 Anemia, unspecified: Secondary | ICD-10-CM | POA: Diagnosis not present

## 2018-04-23 DIAGNOSIS — R7989 Other specified abnormal findings of blood chemistry: Secondary | ICD-10-CM | POA: Diagnosis not present

## 2018-04-23 LAB — BASIC METABOLIC PANEL
Anion gap: 7 (ref 5–15)
BUN: 20 mg/dL (ref 8–23)
CALCIUM: 9 mg/dL (ref 8.9–10.3)
CO2: 25 mmol/L (ref 22–32)
CREATININE: 1.28 mg/dL — AB (ref 0.44–1.00)
Chloride: 109 mmol/L (ref 98–111)
GFR calc non Af Amer: 41 mL/min — ABNORMAL LOW (ref >60)
GFR, EST AFRICAN AMERICAN: 48 mL/min — AB (ref >60)
Glucose, Bld: 103 mg/dL — ABNORMAL HIGH (ref 70–99)
Potassium: 4.5 mmol/L (ref 3.5–5.1)
SODIUM: 141 mmol/L (ref 135–145)

## 2018-04-23 LAB — ABO/RH
ABO/RH(D): A POS
DAT, IGG: NEGATIVE
WEAK D: POSITIVE

## 2018-04-23 LAB — CBC
HCT: 25 % — ABNORMAL LOW (ref 35.0–47.0)
Hemoglobin: 7.9 g/dL — ABNORMAL LOW (ref 12.0–16.0)
MCH: 26.8 pg (ref 26.0–34.0)
MCHC: 31.5 g/dL — AB (ref 32.0–36.0)
MCV: 85.1 fL (ref 80.0–100.0)
Platelets: 283 10*3/uL (ref 150–440)
RBC: 2.94 MIL/uL — ABNORMAL LOW (ref 3.80–5.20)
RDW: 20.3 % — AB (ref 11.5–14.5)
WBC: 6.6 10*3/uL (ref 3.6–11.0)

## 2018-04-23 LAB — PROTIME-INR
INR: 1.09
PROTHROMBIN TIME: 14 s (ref 11.4–15.2)

## 2018-04-23 LAB — PREPARE RBC (CROSSMATCH)

## 2018-04-23 LAB — APTT: aPTT: 30 seconds (ref 24–36)

## 2018-04-23 MED ORDER — SODIUM CHLORIDE 0.9 % IV SOLN: 10.0000 mL/h | Freq: Once | INTRAVENOUS | Status: DC

## 2018-04-23 MED ORDER — IRON 325 (65 FE) MG PO TABS: 1.0000 | ORAL_TABLET | Freq: Every day | ORAL | 1 refills | Status: DC

## 2018-04-23 MED ORDER — SODIUM CHLORIDE 0.9 % IV BOLUS: 1000.0000 mL | Freq: Once | INTRAVENOUS | Status: DC

## 2018-04-23 NOTE — ED Notes (Signed)
Pt given mouth swabs for dry mouth

## 2018-04-23 NOTE — ED Triage Notes (Signed)
Pt presents today via [facility] from home, as pt PCP called and told her to get straight to hospital for low hemoglobin. Pt is A/O speech is slurred which is baseline from CVA 2 yrs ago. Pt is R/s paralysis as baseline. VSS NAD

## 2018-04-23 NOTE — ED Notes (Addendum)
[facility]rite on stretcher in exam room with no with distress noted; blood tx cont with no S&S rx noted or reported; pt denies any c/o at present; warm blanket provided for comfort

## 2018-04-23 NOTE — ED Notes (Signed)
Blood tx complete with no S&S rx noted or reported; NS infusing to clear line

## 2018-04-23 NOTE — ED Notes (Signed)
Consent printed due to right sided weakness of pt making her unable to ambulate to [place]. Pt hand was unsteady with pen and asked husband to sign consent. Witnessed by Verneda Skill, RN
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
## 2018-04-24 LAB — TYPE AND SCREEN
ABO/RH(D): A POS
Antibody Screen: NEGATIVE
DAT, IgG: NEGATIVE
Unit division: 0
Weak D: POSITIVE

## 2018-04-24 LAB — BPAM RBC
Blood Product Expiration Date: 201910042359
ISSUE DATE / TIME: 201909091822
Unit Type and Rh: 6200

## 2018-04-27 DIAGNOSIS — I4891 Unspecified atrial fibrillation: Secondary | ICD-10-CM | POA: Diagnosis not present

## 2018-04-27 DIAGNOSIS — I69351 Hemiplegia and hemiparesis following cerebral infarction affecting right dominant side: Secondary | ICD-10-CM | POA: Diagnosis not present

## 2018-04-27 DIAGNOSIS — K219 Gastro-esophageal reflux disease without esophagitis: Secondary | ICD-10-CM | POA: Diagnosis not present

## 2018-04-27 DIAGNOSIS — Z681 Body mass index (BMI) 19 or less, adult: Secondary | ICD-10-CM | POA: Diagnosis not present

## 2018-04-27 DIAGNOSIS — D509 Iron deficiency anemia, unspecified: Secondary | ICD-10-CM | POA: Diagnosis not present

## 2018-04-27 DIAGNOSIS — Z7901 Long term (current) use of anticoagulants: Secondary | ICD-10-CM | POA: Diagnosis not present

## 2018-04-27 DIAGNOSIS — Z8673 Personal history of transient ischemic attack (TIA), and cerebral infarction without residual deficits: Secondary | ICD-10-CM | POA: Diagnosis not present

## 2018-04-27 DIAGNOSIS — E782 Mixed hyperlipidemia: Secondary | ICD-10-CM | POA: Diagnosis not present

## 2018-04-27 DIAGNOSIS — I69322 Dysarthria following cerebral infarction: Secondary | ICD-10-CM | POA: Diagnosis not present

## 2018-04-27 DIAGNOSIS — E785 Hyperlipidemia, unspecified: Secondary | ICD-10-CM | POA: Diagnosis not present

## 2018-04-27 DIAGNOSIS — Z720 Tobacco use: Secondary | ICD-10-CM | POA: Diagnosis not present

## 2018-04-27 DIAGNOSIS — I480 Paroxysmal atrial fibrillation: Secondary | ICD-10-CM | POA: Diagnosis not present

## 2018-04-27 DIAGNOSIS — I10 Essential (primary) hypertension: Secondary | ICD-10-CM | POA: Diagnosis not present

## 2018-05-04 DIAGNOSIS — D509 Iron deficiency anemia, unspecified: Secondary | ICD-10-CM | POA: Diagnosis not present

## 2018-05-10 DIAGNOSIS — I4891 Unspecified atrial fibrillation: Secondary | ICD-10-CM | POA: Diagnosis not present

## 2018-05-10 DIAGNOSIS — R1310 Dysphagia, unspecified: Secondary | ICD-10-CM | POA: Diagnosis not present

## 2018-05-10 DIAGNOSIS — D509 Iron deficiency anemia, unspecified: Secondary | ICD-10-CM | POA: Diagnosis not present

## 2018-05-10 DIAGNOSIS — I6930 Unspecified sequelae of cerebral infarction: Secondary | ICD-10-CM | POA: Diagnosis not present

## 2018-05-21 DIAGNOSIS — I10 Essential (primary) hypertension: Secondary | ICD-10-CM | POA: Diagnosis not present

## 2018-05-21 DIAGNOSIS — I6523 Occlusion and stenosis of bilateral carotid arteries: Secondary | ICD-10-CM | POA: Diagnosis not present

## 2018-05-21 DIAGNOSIS — I480 Paroxysmal atrial fibrillation: Secondary | ICD-10-CM | POA: Diagnosis not present

## 2018-05-21 DIAGNOSIS — Z8673 Personal history of transient ischemic attack (TIA), and cerebral infarction without residual deficits: Secondary | ICD-10-CM | POA: Diagnosis not present

## 2018-05-21 DIAGNOSIS — E782 Mixed hyperlipidemia: Secondary | ICD-10-CM | POA: Diagnosis not present

## 2018-05-28 DIAGNOSIS — I480 Paroxysmal atrial fibrillation: Secondary | ICD-10-CM | POA: Diagnosis not present

## 2018-06-26 ENCOUNTER — Encounter: Admission: RE | Payer: Self-pay | Source: Ambulatory Visit

## 2018-06-26 ENCOUNTER — Ambulatory Visit: Admission: RE | Admit: 2018-06-26 | Payer: Medicare HMO | Source: Ambulatory Visit | Admitting: Internal Medicine

## 2018-06-26 SURGERY — ESOPHAGOGASTRODUODENOSCOPY (EGD) WITH PROPOFOL
Anesthesia: General

## 2018-07-23 DIAGNOSIS — R51 Headache: Secondary | ICD-10-CM | POA: Diagnosis not present

## 2018-07-23 DIAGNOSIS — Z8673 Personal history of transient ischemic attack (TIA), and cerebral infarction without residual deficits: Secondary | ICD-10-CM | POA: Diagnosis not present

## 2018-07-31 DIAGNOSIS — I480 Paroxysmal atrial fibrillation: Secondary | ICD-10-CM | POA: Diagnosis not present

## 2018-08-03 DIAGNOSIS — E782 Mixed hyperlipidemia: Secondary | ICD-10-CM | POA: Diagnosis not present

## 2018-08-03 DIAGNOSIS — Z8673 Personal history of transient ischemic attack (TIA), and cerebral infarction without residual deficits: Secondary | ICD-10-CM | POA: Diagnosis not present

## 2018-08-03 DIAGNOSIS — Z23 Encounter for immunization: Secondary | ICD-10-CM | POA: Diagnosis not present

## 2018-08-03 DIAGNOSIS — I6523 Occlusion and stenosis of bilateral carotid arteries: Secondary | ICD-10-CM | POA: Diagnosis not present

## 2018-08-03 DIAGNOSIS — I10 Essential (primary) hypertension: Secondary | ICD-10-CM | POA: Diagnosis not present

## 2018-08-03 DIAGNOSIS — I480 Paroxysmal atrial fibrillation: Secondary | ICD-10-CM | POA: Diagnosis not present

## 2018-08-15 ENCOUNTER — Ambulatory Visit: Payer: Medicare HMO | Attending: Neurology

## 2018-08-17 DIAGNOSIS — G4730 Sleep apnea, unspecified: Secondary | ICD-10-CM | POA: Diagnosis not present

## 2018-08-22 DIAGNOSIS — D509 Iron deficiency anemia, unspecified: Secondary | ICD-10-CM | POA: Diagnosis not present

## 2018-08-28 ENCOUNTER — Ambulatory Visit: Admit: 2018-08-28 | Payer: Medicare HMO | Admitting: Internal Medicine

## 2018-08-28 SURGERY — EGD (ESOPHAGOGASTRODUODENOSCOPY)
Anesthesia: General

## 2018-09-13 ENCOUNTER — Ambulatory Visit: Payer: Medicare HMO | Attending: Neurology

## 2018-09-13 DIAGNOSIS — G4733 Obstructive sleep apnea (adult) (pediatric): Secondary | ICD-10-CM | POA: Insufficient documentation

## 2018-09-13 DIAGNOSIS — Z8673 Personal history of transient ischemic attack (TIA), and cerebral infarction without residual deficits: Secondary | ICD-10-CM | POA: Insufficient documentation

## 2018-10-30 DIAGNOSIS — I10 Essential (primary) hypertension: Secondary | ICD-10-CM | POA: Diagnosis not present

## 2018-10-30 DIAGNOSIS — E782 Mixed hyperlipidemia: Secondary | ICD-10-CM | POA: Diagnosis not present

## 2018-11-06 DIAGNOSIS — K219 Gastro-esophageal reflux disease without esophagitis: Secondary | ICD-10-CM | POA: Diagnosis not present

## 2018-11-06 DIAGNOSIS — F17210 Nicotine dependence, cigarettes, uncomplicated: Secondary | ICD-10-CM | POA: Diagnosis not present

## 2018-11-06 DIAGNOSIS — I480 Paroxysmal atrial fibrillation: Secondary | ICD-10-CM | POA: Diagnosis not present

## 2018-11-06 DIAGNOSIS — Z720 Tobacco use: Secondary | ICD-10-CM | POA: Diagnosis not present

## 2018-11-06 DIAGNOSIS — E782 Mixed hyperlipidemia: Secondary | ICD-10-CM | POA: Diagnosis not present

## 2018-11-06 DIAGNOSIS — Z8673 Personal history of transient ischemic attack (TIA), and cerebral infarction without residual deficits: Secondary | ICD-10-CM | POA: Diagnosis not present

## 2018-11-06 DIAGNOSIS — I10 Essential (primary) hypertension: Secondary | ICD-10-CM | POA: Diagnosis not present

## 2018-11-06 DIAGNOSIS — Z0001 Encounter for general adult medical examination with abnormal findings: Secondary | ICD-10-CM | POA: Diagnosis not present

## 2018-11-06 DIAGNOSIS — Z0000 Encounter for general adult medical examination without abnormal findings: Secondary | ICD-10-CM | POA: Diagnosis not present

## 2018-11-19 DIAGNOSIS — Z0100 Encounter for examination of eyes and vision without abnormal findings: Secondary | ICD-10-CM | POA: Diagnosis not present

## 2018-11-19 DIAGNOSIS — H5213 Myopia, bilateral: Secondary | ICD-10-CM | POA: Diagnosis not present

## 2018-11-19 DIAGNOSIS — H524 Presbyopia: Secondary | ICD-10-CM | POA: Diagnosis not present

## 2018-11-19 DIAGNOSIS — D3121 Benign neoplasm of right retina: Secondary | ICD-10-CM | POA: Diagnosis not present

## 2018-11-19 DIAGNOSIS — H2513 Age-related nuclear cataract, bilateral: Secondary | ICD-10-CM | POA: Diagnosis not present

## 2018-11-19 DIAGNOSIS — H52223 Regular astigmatism, bilateral: Secondary | ICD-10-CM | POA: Diagnosis not present

## 2019-01-30 DIAGNOSIS — I10 Essential (primary) hypertension: Secondary | ICD-10-CM | POA: Diagnosis not present

## 2019-02-06 DIAGNOSIS — I10 Essential (primary) hypertension: Secondary | ICD-10-CM | POA: Diagnosis not present

## 2019-02-06 DIAGNOSIS — Z720 Tobacco use: Secondary | ICD-10-CM | POA: Diagnosis not present

## 2019-02-06 DIAGNOSIS — E782 Mixed hyperlipidemia: Secondary | ICD-10-CM | POA: Diagnosis not present

## 2019-02-06 DIAGNOSIS — I480 Paroxysmal atrial fibrillation: Secondary | ICD-10-CM | POA: Diagnosis not present

## 2019-02-06 DIAGNOSIS — K219 Gastro-esophageal reflux disease without esophagitis: Secondary | ICD-10-CM | POA: Diagnosis not present

## 2019-02-06 DIAGNOSIS — Z8673 Personal history of transient ischemic attack (TIA), and cerebral infarction without residual deficits: Secondary | ICD-10-CM | POA: Diagnosis not present

## 2019-06-04 DIAGNOSIS — I10 Essential (primary) hypertension: Secondary | ICD-10-CM | POA: Diagnosis not present

## 2019-06-11 DIAGNOSIS — M329 Systemic lupus erythematosus, unspecified: Secondary | ICD-10-CM | POA: Diagnosis not present

## 2019-06-11 DIAGNOSIS — Z23 Encounter for immunization: Secondary | ICD-10-CM | POA: Diagnosis not present

## 2019-06-11 DIAGNOSIS — I10 Essential (primary) hypertension: Secondary | ICD-10-CM | POA: Diagnosis not present

## 2019-06-11 DIAGNOSIS — Z8673 Personal history of transient ischemic attack (TIA), and cerebral infarction without residual deficits: Secondary | ICD-10-CM | POA: Diagnosis not present

## 2019-06-11 DIAGNOSIS — I6523 Occlusion and stenosis of bilateral carotid arteries: Secondary | ICD-10-CM | POA: Diagnosis not present

## 2019-06-11 DIAGNOSIS — Z79899 Other long term (current) drug therapy: Secondary | ICD-10-CM | POA: Diagnosis not present

## 2019-06-11 DIAGNOSIS — F17200 Nicotine dependence, unspecified, uncomplicated: Secondary | ICD-10-CM | POA: Diagnosis not present

## 2019-06-11 DIAGNOSIS — Z7901 Long term (current) use of anticoagulants: Secondary | ICD-10-CM | POA: Diagnosis not present

## 2019-06-11 DIAGNOSIS — E782 Mixed hyperlipidemia: Secondary | ICD-10-CM | POA: Diagnosis not present

## 2019-06-11 DIAGNOSIS — I480 Paroxysmal atrial fibrillation: Secondary | ICD-10-CM | POA: Diagnosis not present

## 2019-10-02 DIAGNOSIS — I10 Essential (primary) hypertension: Secondary | ICD-10-CM | POA: Diagnosis not present

## 2019-10-02 DIAGNOSIS — E538 Deficiency of other specified B group vitamins: Secondary | ICD-10-CM | POA: Diagnosis not present

## 2019-10-09 DIAGNOSIS — Z7901 Long term (current) use of anticoagulants: Secondary | ICD-10-CM | POA: Diagnosis not present

## 2019-10-09 DIAGNOSIS — F17210 Nicotine dependence, cigarettes, uncomplicated: Secondary | ICD-10-CM | POA: Diagnosis not present

## 2019-10-09 DIAGNOSIS — E782 Mixed hyperlipidemia: Secondary | ICD-10-CM | POA: Diagnosis not present

## 2019-10-09 DIAGNOSIS — I10 Essential (primary) hypertension: Secondary | ICD-10-CM | POA: Diagnosis not present

## 2019-10-09 DIAGNOSIS — M329 Systemic lupus erythematosus, unspecified: Secondary | ICD-10-CM | POA: Diagnosis not present

## 2019-10-09 DIAGNOSIS — I480 Paroxysmal atrial fibrillation: Secondary | ICD-10-CM | POA: Diagnosis not present

## 2019-10-09 DIAGNOSIS — Z8673 Personal history of transient ischemic attack (TIA), and cerebral infarction without residual deficits: Secondary | ICD-10-CM | POA: Diagnosis not present

## 2019-10-09 DIAGNOSIS — Z0000 Encounter for general adult medical examination without abnormal findings: Secondary | ICD-10-CM | POA: Diagnosis not present

## 2019-11-19 DIAGNOSIS — H2513 Age-related nuclear cataract, bilateral: Secondary | ICD-10-CM | POA: Diagnosis not present

## 2019-12-17 DIAGNOSIS — H2512 Age-related nuclear cataract, left eye: Secondary | ICD-10-CM | POA: Diagnosis not present

## 2019-12-17 DIAGNOSIS — H2513 Age-related nuclear cataract, bilateral: Secondary | ICD-10-CM | POA: Diagnosis not present

## 2019-12-17 DIAGNOSIS — H18413 Arcus senilis, bilateral: Secondary | ICD-10-CM | POA: Diagnosis not present

## 2019-12-17 DIAGNOSIS — H25013 Cortical age-related cataract, bilateral: Secondary | ICD-10-CM | POA: Diagnosis not present

## 2019-12-17 DIAGNOSIS — H25043 Posterior subcapsular polar age-related cataract, bilateral: Secondary | ICD-10-CM | POA: Diagnosis not present

## 2019-12-23 DIAGNOSIS — H2511 Age-related nuclear cataract, right eye: Secondary | ICD-10-CM | POA: Diagnosis not present

## 2019-12-24 DIAGNOSIS — H2512 Age-related nuclear cataract, left eye: Secondary | ICD-10-CM | POA: Diagnosis not present

## 2020-01-01 DIAGNOSIS — E782 Mixed hyperlipidemia: Secondary | ICD-10-CM | POA: Diagnosis not present

## 2020-01-01 DIAGNOSIS — I10 Essential (primary) hypertension: Secondary | ICD-10-CM | POA: Diagnosis not present

## 2020-01-08 DIAGNOSIS — Z7901 Long term (current) use of anticoagulants: Secondary | ICD-10-CM | POA: Diagnosis not present

## 2020-01-08 DIAGNOSIS — I480 Paroxysmal atrial fibrillation: Secondary | ICD-10-CM | POA: Diagnosis not present

## 2020-01-08 DIAGNOSIS — Z720 Tobacco use: Secondary | ICD-10-CM | POA: Diagnosis not present

## 2020-01-08 DIAGNOSIS — I10 Essential (primary) hypertension: Secondary | ICD-10-CM | POA: Diagnosis not present

## 2020-01-08 DIAGNOSIS — J40 Bronchitis, not specified as acute or chronic: Secondary | ICD-10-CM | POA: Diagnosis not present

## 2020-01-08 DIAGNOSIS — F17200 Nicotine dependence, unspecified, uncomplicated: Secondary | ICD-10-CM | POA: Diagnosis not present

## 2020-01-08 DIAGNOSIS — Z8673 Personal history of transient ischemic attack (TIA), and cerebral infarction without residual deficits: Secondary | ICD-10-CM | POA: Diagnosis not present

## 2020-01-08 DIAGNOSIS — I6523 Occlusion and stenosis of bilateral carotid arteries: Secondary | ICD-10-CM | POA: Diagnosis not present

## 2020-01-08 DIAGNOSIS — Z0001 Encounter for general adult medical examination with abnormal findings: Secondary | ICD-10-CM | POA: Diagnosis not present

## 2020-05-05 DIAGNOSIS — I10 Essential (primary) hypertension: Secondary | ICD-10-CM | POA: Diagnosis not present

## 2020-05-12 DIAGNOSIS — I10 Essential (primary) hypertension: Secondary | ICD-10-CM | POA: Diagnosis not present

## 2020-05-12 DIAGNOSIS — Z23 Encounter for immunization: Secondary | ICD-10-CM | POA: Diagnosis not present

## 2020-05-12 DIAGNOSIS — I480 Paroxysmal atrial fibrillation: Secondary | ICD-10-CM | POA: Diagnosis not present

## 2020-05-12 DIAGNOSIS — Z8673 Personal history of transient ischemic attack (TIA), and cerebral infarction without residual deficits: Secondary | ICD-10-CM | POA: Diagnosis not present

## 2020-05-12 DIAGNOSIS — Z720 Tobacco use: Secondary | ICD-10-CM | POA: Diagnosis not present

## 2020-05-12 DIAGNOSIS — E782 Mixed hyperlipidemia: Secondary | ICD-10-CM | POA: Diagnosis not present

## 2020-09-09 DIAGNOSIS — I10 Essential (primary) hypertension: Secondary | ICD-10-CM | POA: Diagnosis not present

## 2020-09-14 DIAGNOSIS — Z720 Tobacco use: Secondary | ICD-10-CM | POA: Diagnosis not present

## 2020-09-14 DIAGNOSIS — F17210 Nicotine dependence, cigarettes, uncomplicated: Secondary | ICD-10-CM | POA: Diagnosis not present

## 2020-09-14 DIAGNOSIS — Z8673 Personal history of transient ischemic attack (TIA), and cerebral infarction without residual deficits: Secondary | ICD-10-CM | POA: Diagnosis not present

## 2020-09-14 DIAGNOSIS — I480 Paroxysmal atrial fibrillation: Secondary | ICD-10-CM | POA: Diagnosis not present

## 2020-09-14 DIAGNOSIS — I10 Essential (primary) hypertension: Secondary | ICD-10-CM | POA: Diagnosis not present

## 2020-09-14 DIAGNOSIS — Z0000 Encounter for general adult medical examination without abnormal findings: Secondary | ICD-10-CM | POA: Diagnosis not present

## 2020-09-14 DIAGNOSIS — E782 Mixed hyperlipidemia: Secondary | ICD-10-CM | POA: Diagnosis not present

## 2021-04-08 ENCOUNTER — Emergency Department: Payer: Medicare Other

## 2021-04-08 ENCOUNTER — Other Ambulatory Visit: Payer: Self-pay

## 2021-04-08 ENCOUNTER — Observation Stay: Payer: Medicare Other

## 2021-04-08 ENCOUNTER — Inpatient Hospital Stay
Admission: EM | Admit: 2021-04-08 | Discharge: 2021-04-10 | DRG: 309 | Disposition: A | Discharge status: Home Health | Payer: Medicare Other | Attending: Internal Medicine | Admitting: Internal Medicine

## 2021-04-08 DIAGNOSIS — E785 Hyperlipidemia, unspecified: Secondary | ICD-10-CM | POA: Diagnosis present

## 2021-04-08 DIAGNOSIS — F17210 Nicotine dependence, cigarettes, uncomplicated: Secondary | ICD-10-CM | POA: Diagnosis present

## 2021-04-08 DIAGNOSIS — C3411 Malignant neoplasm of upper lobe, right bronchus or lung: Secondary | ICD-10-CM | POA: Diagnosis present

## 2021-04-08 DIAGNOSIS — Z7982 Long term (current) use of aspirin: Secondary | ICD-10-CM

## 2021-04-08 DIAGNOSIS — R0602 Shortness of breath: Secondary | ICD-10-CM

## 2021-04-08 DIAGNOSIS — I69392 Facial weakness following cerebral infarction: Secondary | ICD-10-CM

## 2021-04-08 DIAGNOSIS — I480 Paroxysmal atrial fibrillation: Secondary | ICD-10-CM | POA: Diagnosis not present

## 2021-04-08 DIAGNOSIS — I4891 Unspecified atrial fibrillation: Secondary | ICD-10-CM | POA: Diagnosis not present

## 2021-04-08 DIAGNOSIS — Z7901 Long term (current) use of anticoagulants: Secondary | ICD-10-CM

## 2021-04-08 DIAGNOSIS — Z20822 Contact with and (suspected) exposure to covid-19: Secondary | ICD-10-CM | POA: Diagnosis present

## 2021-04-08 DIAGNOSIS — R634 Abnormal weight loss: Secondary | ICD-10-CM | POA: Diagnosis present

## 2021-04-08 DIAGNOSIS — F17200 Nicotine dependence, unspecified, uncomplicated: Secondary | ICD-10-CM | POA: Diagnosis present

## 2021-04-08 DIAGNOSIS — K2100 Gastro-esophageal reflux disease with esophagitis, without bleeding: Secondary | ICD-10-CM | POA: Diagnosis present

## 2021-04-08 DIAGNOSIS — Z96641 Presence of right artificial hip joint: Secondary | ICD-10-CM | POA: Diagnosis present

## 2021-04-08 DIAGNOSIS — S72001A Fracture of unspecified part of neck of right femur, initial encounter for closed fracture: Secondary | ICD-10-CM | POA: Diagnosis present

## 2021-04-08 DIAGNOSIS — I6930 Unspecified sequelae of cerebral infarction: Secondary | ICD-10-CM

## 2021-04-08 DIAGNOSIS — Z8249 Family history of ischemic heart disease and other diseases of the circulatory system: Secondary | ICD-10-CM

## 2021-04-08 DIAGNOSIS — Z803 Family history of malignant neoplasm of breast: Secondary | ICD-10-CM

## 2021-04-08 DIAGNOSIS — R918 Other nonspecific abnormal finding of lung field: Secondary | ICD-10-CM | POA: Diagnosis present

## 2021-04-08 DIAGNOSIS — I69322 Dysarthria following cerebral infarction: Secondary | ICD-10-CM

## 2021-04-08 DIAGNOSIS — M5431 Sciatica, right side: Secondary | ICD-10-CM | POA: Diagnosis present

## 2021-04-08 DIAGNOSIS — Z6823 Body mass index (BMI) 23.0-23.9, adult: Secondary | ICD-10-CM

## 2021-04-08 DIAGNOSIS — E440 Moderate protein-calorie malnutrition: Secondary | ICD-10-CM | POA: Insufficient documentation

## 2021-04-08 DIAGNOSIS — E46 Unspecified protein-calorie malnutrition: Secondary | ICD-10-CM | POA: Diagnosis present

## 2021-04-08 DIAGNOSIS — M79604 Pain in right leg: Secondary | ICD-10-CM | POA: Diagnosis present

## 2021-04-08 DIAGNOSIS — F32A Depression, unspecified: Secondary | ICD-10-CM

## 2021-04-08 DIAGNOSIS — F419 Anxiety disorder, unspecified: Secondary | ICD-10-CM | POA: Diagnosis present

## 2021-04-08 DIAGNOSIS — I69351 Hemiplegia and hemiparesis following cerebral infarction affecting right dominant side: Secondary | ICD-10-CM

## 2021-04-08 DIAGNOSIS — I10 Essential (primary) hypertension: Secondary | ICD-10-CM | POA: Diagnosis present

## 2021-04-08 DIAGNOSIS — G8929 Other chronic pain: Secondary | ICD-10-CM | POA: Diagnosis present

## 2021-04-08 DIAGNOSIS — Z720 Tobacco use: Secondary | ICD-10-CM | POA: Diagnosis present

## 2021-04-08 DIAGNOSIS — Z833 Family history of diabetes mellitus: Secondary | ICD-10-CM

## 2021-04-08 LAB — URINALYSIS, COMPLETE (UACMP) WITH MICROSCOPIC
Bacteria, UA: NONE SEEN
Bilirubin Urine: NEGATIVE
Glucose, UA: NEGATIVE mg/dL
Hgb urine dipstick: NEGATIVE
Ketones, ur: NEGATIVE mg/dL
Nitrite: NEGATIVE
Protein, ur: NEGATIVE mg/dL
Specific Gravity, Urine: 1.013 (ref 1.005–1.030)
pH: 5 (ref 5.0–8.0)

## 2021-04-08 LAB — PROCALCITONIN: Procalcitonin: <0.1 ng/mL

## 2021-04-08 LAB — CBC WITH DIFFERENTIAL/PLATELET
Abs Immature Granulocytes: 0.02 10*3/uL (ref 0.00–0.07)
Basophils Absolute: 0.1 10*3/uL (ref 0.0–0.1)
Basophils Relative: 1 %
Eosinophils Absolute: 0.1 10*3/uL (ref 0.0–0.5)
Eosinophils Relative: 1 %
HCT: 38.2 % (ref 36.0–46.0)
Hemoglobin: 12.9 g/dL (ref 12.0–15.0)
Immature Granulocytes: 0 %
Lymphocytes Relative: 17 %
Lymphs Abs: 1.6 10*3/uL (ref 0.7–4.0)
MCH: 34.4 pg — ABNORMAL HIGH (ref 26.0–34.0)
MCHC: 33.8 g/dL (ref 30.0–36.0)
MCV: 101.9 fL — ABNORMAL HIGH (ref 80.0–100.0)
Monocytes Absolute: 0.5 10*3/uL (ref 0.1–1.0)
Monocytes Relative: 5 %
Neutro Abs: 6.9 10*3/uL (ref 1.7–7.7)
Neutrophils Relative %: 76 %
Platelets: 279 10*3/uL (ref 150–400)
RBC: 3.75 MIL/uL — ABNORMAL LOW (ref 3.87–5.11)
RDW: 14.7 % (ref 11.5–15.5)
WBC: 9.1 10*3/uL (ref 4.0–10.5)
nRBC: 0 % (ref 0.0–0.2)

## 2021-04-08 LAB — RESP PANEL BY RT-PCR (FLU A&B, COVID) ARPGX2
Influenza A by PCR: NEGATIVE
Influenza B by PCR: NEGATIVE
SARS Coronavirus 2 by RT PCR: NEGATIVE

## 2021-04-08 LAB — COMPREHENSIVE METABOLIC PANEL
ALT: 16 U/L (ref 0–44)
AST: 27 U/L (ref 15–41)
Albumin: 4.1 g/dL (ref 3.5–5.0)
Alkaline Phosphatase: 66 U/L (ref 38–126)
Anion gap: 9 (ref 5–15)
BUN: 26 mg/dL — ABNORMAL HIGH (ref 8–23)
CO2: 25 mmol/L (ref 22–32)
Calcium: 9.3 mg/dL (ref 8.9–10.3)
Chloride: 104 mmol/L (ref 98–111)
Creatinine, Ser: 1.14 mg/dL — ABNORMAL HIGH (ref 0.44–1.00)
GFR, Estimated: 51 mL/min — ABNORMAL LOW (ref >60)
Glucose, Bld: 122 mg/dL — ABNORMAL HIGH (ref 70–99)
Potassium: 4 mmol/L (ref 3.5–5.1)
Sodium: 138 mmol/L (ref 135–145)
Total Bilirubin: 1 mg/dL (ref 0.3–1.2)
Total Protein: 7.4 g/dL (ref 6.5–8.1)

## 2021-04-08 LAB — MAGNESIUM: Magnesium: 1.9 mg/dL (ref 1.7–2.4)

## 2021-04-08 LAB — TSH: TSH: 1.467 u[IU]/mL (ref 0.350–4.500)

## 2021-04-08 MED ORDER — DILTIAZEM HCL-DEXTROSE 125-5 MG/125ML-% IV SOLN (PREMIX)
2.5000 mg/h | INTRAVENOUS | Status: DC
Start: 2021-04-08 — End: 2021-04-09
  Administered 2021-04-08: 5 mg/h via INTRAVENOUS
  Filled 2021-04-08: qty 125

## 2021-04-08 MED ORDER — NICOTINE 14 MG/24HR TD PT24
14.0000 mg | MEDICATED_PATCH | Freq: Every day | TRANSDERMAL | Status: DC
  Administered 2021-04-09 – 2021-04-10 (×3): 14 mg via TRANSDERMAL
  Filled 2021-04-08 (×3): qty 1

## 2021-04-08 MED ORDER — ACETAMINOPHEN 650 MG RE SUPP: 650.0000 mg | Freq: Four times a day (QID) | RECTAL | Status: DC | PRN

## 2021-04-08 MED ORDER — DILTIAZEM HCL 25 MG/5ML IV SOLN
10.0000 mg | Freq: Once | INTRAVENOUS | Status: AC
  Administered 2021-04-08: 10 mg via INTRAVENOUS
  Filled 2021-04-08: qty 5

## 2021-04-08 MED ORDER — ACETAMINOPHEN 325 MG PO TABS
650.0000 mg | ORAL_TABLET | Freq: Four times a day (QID) | ORAL | Status: DC | PRN
  Administered 2021-04-10: 650 mg via ORAL
  Filled 2021-04-08: qty 2

## 2021-04-08 MED ORDER — HYDROXYZINE HCL 25 MG PO TABS: 25.0000 mg | ORAL_TABLET | Freq: Three times a day (TID) | ORAL | Status: DC | PRN

## 2021-04-08 MED ORDER — SODIUM CHLORIDE 0.9 % IV BOLUS
500.0000 mL | Freq: Once | INTRAVENOUS | Status: AC
  Administered 2021-04-08: 500 mL via INTRAVENOUS

## 2021-04-08 MED ORDER — HYDROCODONE-ACETAMINOPHEN 5-325 MG PO TABS: 1.0000 | ORAL_TABLET | Freq: Three times a day (TID) | ORAL | Status: DC | PRN

## 2021-04-08 MED ORDER — LORAZEPAM 2 MG/ML IJ SOLN
1.0000 mg | Freq: Once | INTRAMUSCULAR | Status: AC | PRN
  Administered 2021-04-09: 1 mg via INTRAVENOUS
  Filled 2021-04-08: qty 1

## 2021-04-08 MED ORDER — APIXABAN 5 MG PO TABS
5.0000 mg | ORAL_TABLET | Freq: Two times a day (BID) | ORAL | Status: DC
  Administered 2021-04-08 – 2021-04-10 (×4): 5 mg via ORAL
  Filled 2021-04-08 (×4): qty 1

## 2021-04-08 MED ORDER — ATORVASTATIN CALCIUM 20 MG PO TABS
40.0000 mg | ORAL_TABLET | Freq: Every day | ORAL | Status: DC
  Administered 2021-04-09: 40 mg via ORAL
  Filled 2021-04-08: qty 2

## 2021-04-08 MED ORDER — IOHEXOL 350 MG/ML SOLN
75.0000 mL | Freq: Once | INTRAVENOUS | Status: AC | PRN
  Administered 2021-04-08: 75 mL via INTRAVENOUS

## 2021-04-08 MED ORDER — ONDANSETRON HCL 4 MG PO TABS: 4.0000 mg | ORAL_TABLET | Freq: Four times a day (QID) | ORAL | Status: DC | PRN

## 2021-04-08 MED ORDER — ONDANSETRON HCL 4 MG/2ML IJ SOLN
4.0000 mg | Freq: Four times a day (QID) | INTRAMUSCULAR | Status: DC | PRN
Start: 2021-04-08 — End: 2021-04-10

## 2021-04-08 MED ORDER — ACETAMINOPHEN 500 MG PO TABS
1000.0000 mg | ORAL_TABLET | Freq: Once | ORAL | Status: AC
  Administered 2021-04-08: 1000 mg via ORAL
  Filled 2021-04-08: qty 2

## 2021-04-08 MED ORDER — ASPIRIN 325 MG PO TABS
325.0000 mg | ORAL_TABLET | Freq: Every day | ORAL | Status: DC
  Administered 2021-04-09: 325 mg via ORAL
  Filled 2021-04-08: qty 1

## 2021-04-08 NOTE — ED Notes (Signed)
Provider made aware of possible code stroke, patient complains of increased weakness today and difficulty with walking due to same, headache on left side of head that is sharp and that started this morning, numbness of right side of upper lip that she states started around 1400. She states her speech is more slurred today and is noted to be moderately slurred, and blurry vision that has started in right eye since arrival to ER.  Provider states he will come see patient shortly.

## 2021-04-08 NOTE — ED Notes (Signed)
Patient transported to CT 

## 2021-04-08 NOTE — H&P (Signed)
History and Physical   Andrea Kennedy GXQ:[id number] DOB: April 13, 1946 DOA: 04/08/2021  PCP: Baxter Hire, MD  Outpatient Specialists: None Patient coming from: Home via EMS  I have personally briefly reviewed patient's old medical records in [place].  Chief Concern: weakness and numbness at the corner of her right lips  HPI: Andrea Kennedy is a 75 y.o. female with medical history significant for tobacco abuse, hyperlipidemia, history of old left MCA, hypertension, atrial fibrillation on anticoagulation, presents emergency department for chief concerns of generalized malaise, weakness, headaches and slurred speech.  She denies changes to her speech with me. She reports that she has baseline difficulty with speaking and this has not changed. She reports the only new complaint she has is generalized weakness and numbness of the corner of her right mouth. She reports the numbness started about 2 pm today.   She reports she was walking from her home towards the store. She was on the sidewalk with her rollator walker around 12 noon. She reports suddenly feeling bilateral lower extremity weakness causing to fall forward at her walker. She denies head trauma. She felt herself fading out and then she came to. She felt sweaty and nauseous. She does not know completely if she lost consciouisness. She felt nausea and denied vomiting. She endorses abdominal discomfort similar to 'like my bowels need to go too'.  She got her cell phone and called ems.   When EMS arrived, they sat her in the back of their truck/van.  She felt better when she felt the cool air of the [facility] in the truck.  As she felt better and her weakness improved, she asked EMS to take her home.  At approximately 2 pm when she was at home, she was sitting in the living room when she felt numbness at the corner of her right side of her mouth. She though that she would like she to 'have something in her stomach'  so she stood up. Upon standing up, she felt dizzy and she ambulated to kitchen without her walker, she used her  hads to grab the contertops and helped herself along to get into the kitchen for milk.   She states the numbness of her mouth is similar to her stroke in 2018.  She endorses unintentional 7-8 pound weight loss in the last 3 months. She reports that she eats a lot however, she still has weight loss. She states her husband tries to get her to eat more but she can not eat more than she is already.   She endorses baseline shortness of breath that has been worsening gradually over the past year.  At bedside she participates fully with physical exam.  She then becomes tearful stating that she is very worried about her health.  She endorses increased stress lately as patient just got a good living home.  Prior, she was living in a place that was unsafe and had bedbugs and she recently received help from her church and is living in a clean environment.  She reports that she has 3 daughters and all 3 of them are on drugs.  She states they never come see her and whenever they do their trying to take her money.  When I mention that I would like to order an MRI of her brain, she begins to shake with nervousness.  She is requesting for MRI in the a.m.  Social history: Patient lives at home with her husband.  She is a current  everyday smoker.  She started smoking at age 46 and at her peak she was smoking 1 pack/day and currently she smokes half a pack per day.  She denies EtOH and recreational drug use.  She states that she infrequently drinks alcohol, and she does not remember the last time she had an alcoholic drink.  ROS: Constitutional: + Unintentional weight change, generalized malaise, generalized weakness, no fever ENT/Mouth: no sore throat, no rhinorrhea Eyes: no eye pain, no vision changes Cardiovascular: no chest pain, + dyspnea,  no edema, no palpitations Respiratory: no cough, no sputum,  no wheezing Gastrointestinal: no nausea, no vomiting, no diarrhea, no constipation Genitourinary: no urinary incontinence, no dysuria, no hematuria Musculoskeletal: no arthralgias, + myalgias Skin: no skin lesions, no pruritus, Neuro: + weakness, no loss of consciousness, no syncope Psych: no anxiety, no depression, + decrease appetite Heme/Lymph: no bruising, no bleeding  ED Course: Discussed with emergency medicine provider, patient requiring hospitalization for atrial fibrillation with RVR.  Vitals in the emergency department was remarkable for temperature 98.4, increased respiration rate at 39, heart rate of 55 and increased at 120, blood pressure 134/94, SPO2 of 94% on room air.  Labs in the emergency department was remarkable for sodium 138, potassium 4.0, chloride 104, bicarb 25, BUN 26, serum creatinine of 1.14, nonfasting blood glucose 122, WBC 9.1, hemoglobin 12.9, platelets 279, alk phos 66, GFR 51, COVID PCR was negative.  ED provider treated patient with diltiazem 10 mg IV, started patient on diltiazem gtt., gave patient 1 dose of sodium chloride 500 mL bolus.  Patient was informed by ED provider that patient had a mass in the right upper lobe.  Assessment/Plan  Principal Problem:   Atrial fibrillation with RVR (HCC) Active Problems:   Smoker   Sciatica of right side   Hip fracture, right (HCC)   Paroxysmal atrial fibrillation (HCC)   Hyperlipidemia   Benign essential HTN   Tobacco abuse   History of CVA with residual deficit   Gastroesophageal reflux disease with esophagitis   Mass of upper lobe of right lung   Depression   Protein-energy malnutrition (HCC)   Weight loss   # Atrial fibrillation with RVR-etiology is multifactorial including home life stress, currently not on any rate control medication,  # Weakness and malaise-etiology is likely multifactorial in setting of atrial fibrillation with RVR and new diagnosis of suspected primary lung cancer - Patient  is not on any rate control medication - Etiology work-up in progress - UA ordered - I will check TSH, B12, vitamin D - Order MRI of the brain, patient feels very anxious regarding MRI and she is requesting it to be done in the a.m. - Ativan 2 g IV as needed for anxiety, about 15 to 30 minutes prior to MRI - If MRI is positive for new stroke would recommend a.m. team to consult neurology for further recommendation  # Right upper lobe mass in setting of unintentional weight loss and lifelong history of tobacco use-high clinical suspicion for primary lung cancer  - Per CT read, lung mass is approximately 3.7 cm x 2.6 cm - Patient was informed by EDP - Patient will need referral to medical oncology on stabilization from A. fib and or discharge  # Chronic pain-resumed home Norco 5 every 8 hours as needed for moderate pain - I did not resume Robaxin 5 mg every 6 hours as needed for muscle spasms  # History of CVA - Old MCA infarct - Resumed aspirin 325 mg p.o.  daily  # Right lower extremity pain and swelling-ultrasound of the lower extremities ordered to assess for possible DVT  # History of hypertension-controlled as above  # History of hyperlipidemia-atorvastatin 40 mg nightly, lipid panel in the a.m. ordered  # History of right hip pain with history of right hip fracture status post hip replacement  # Depression/anxiety-resumed home Atarax  # Tobacco abuse-nicotine patch ordered  # Unintentional weight loss # Protein/calorie malnutrition  Chart reviewed.   DVT prophylaxis: Apixaban 5 mg twice daily Code Status: Full code Diet: Heart healthy Family Communication: No Disposition Plan: Pending clinical course Consults called: none at this time  Admission status: Progressive cardiac, observation, telemetry ordered for 24 hours  Past Medical History:  Diagnosis Date   Hyperlipidemia    Hypertension    Lupus ([place])    Sciatica of right side    Stroke ([place])    Tobacco abuse     Past Surgical History:  Procedure Laterality Date   REVISION TOTAL HIP ARTHROPLASTY Right    TOTAL HIP ARTHROPLASTY Right 06/03/2015   Procedure: RIGHT TOTAL HIP ARTHROPLASTY ANTERIOR APPROACH;  Surgeon: Gaynelle Arabian, MD;  Location: [facility];  Service: Orthopedics;  Laterality: Right;   TUBAL LIGATION     Social History:  reports that she has been smoking cigarettes. She has been smoking an average of 1 pack per day. She has never used smokeless tobacco. She reports current alcohol use. She reports that she does not use drugs.  Allergies  Allergen Reactions   Floxin [Ofloxacin] Other (See Comments)    Scared/shaky/pain attacks/confusion   Levaquin [Levofloxacin In D5w]    Family History  Problem Relation Age of Onset   Breast cancer Mother    Diabetes Mother    Heart attack Father    Fibromyalgia Sister    Family history: Family history reviewed and pertinent for breast cancer in mother.  Prior to Admission medications   Medication Sig Start Date End Date Taking? Authorizing Provider  acetaminophen (TYLENOL) 325 MG tablet Take 650 mg by mouth every 6 (six) hours as needed (for headache.).    [provider]  apixaban (ELIQUIS) 5 MG TABS tablet Take 1 tablet (5 mg total) by mouth 2 (two) times daily. 05/09/17   Golden Circle, FNP  aspirin 325 MG tablet Take 1 tablet (325 mg total) by mouth daily. 03/09/17   Mariel Aloe, MD  atorvastatin (LIPITOR) 40 MG tablet Take 1 tablet (40 mg total) by mouth daily at 6 PM. 03/08/17   Mariel Aloe, MD  docusate sodium (COLACE) 100 MG capsule Take 1 capsule (100 mg total) by mouth 2 (two) times daily. Patient taking differently: Take 100 mg by mouth 2 (two) times daily as needed for mild constipation.  06/05/15   Eugenie Filler, MD  DULoxetine (CYMBALTA) 30 MG capsule Take 1 capsule (30 mg total) by mouth daily. Patient not taking: Reported on 04/23/2018 05/09/17   Golden Circle, FNP  Ferrous Sulfate (IRON) 325 (65 Fe) MG  TABS Take 1 tablet (325 mg total) by mouth daily. 04/23/18   Rudene Re, MD  HYDROcodone-acetaminophen (NORCO/VICODIN) 5-325 MG tablet Take 1 tablet by mouth every 8 (eight) hours as needed for moderate pain. Patient not taking: Reported on 04/23/2018 05/09/17   Golden Circle, FNP  methocarbamol (ROBAXIN) 500 MG tablet Take 1 tablet (500 mg total) by mouth every 6 (six) hours as needed for muscle spasms. Patient not taking: Reported on 04/23/2018 08/03/16   [place],  Lavon Paganini, PA-C  Multiple Vitamins-Minerals (ALIVE WOMENS 50+ PO) Take 1 tablet by mouth daily.    [provider]  pantoprazole (PROTONIX) 40 MG tablet Take 1 tablet (40 mg total) by mouth daily. 05/09/17   Golden Circle, FNP  vitamin B-12 1000 MCG tablet Take 1 tablet (1,000 mcg total) by mouth daily. Patient not taking: Reported on 04/23/2018 08/03/16   Cathlyn Parsons, PA-C   Physical Exam: Vitals:   04/08/21 1730 04/08/21 1735 04/08/21 1830 04/08/21 1941  BP:   (!) 218/194 108/80  Pulse:   (!) 144 85  Resp:   (!) 24 (!) 26  Temp:  98.4 F (36.9 C)    TempSrc:  Oral    SpO2:   97% 98%  Weight: 71.1 kg     Height: '5\' 8"'  (1.727 m)      Constitutional: appears older than chronological age, frail, NAD, calm, comfortable.  Baseline dysarthria Eyes: PERRL, lids and conjunctivae normal ENMT: Mucous membranes are moist. Posterior pharynx clear of any exudate or lesions. Age-appropriate dentition. Hearing appropriate Neck: normal, supple, no masses, no thyromegaly Respiratory: clear to auscultation bilaterally, no wheezing, no crackles. Normal respiratory effort. No accessory muscle use.  Cardiovascular: Regular rate and rhythm, no murmurs / rubs / gallops. No extremity edema. 2+ pedal pulses. No carotid bruits.  Abdomen: no tenderness, no masses palpated, no hepatosplenomegaly. Bowel sounds positive.  Musculoskeletal: no clubbing / cyanosis. No joint deformity upper and lower extremities. Good ROM, no  contractures, no atrophy. Normal muscle tone.  Skin: no rashes, lesions, ulcers. No induration Neurologic: Sensation intact. Strength 5/5 in all 4.  Psychiatric: Normal judgment and insight. Alert and oriented x 3.  Tearful.  EKG: independently reviewed, showing atrial fibrillation with RVR, rate of 132, QTc 498.  Chest x-ray on Admission: I personally reviewed and I agree with radiologist reading as below.  CT HEAD WO CONTRAST (5MM)  Result Date: 04/08/2021 CLINICAL DATA:  Right side weakness, on Eliquis. EXAM: CT HEAD WITHOUT CONTRAST TECHNIQUE: Contiguous axial images were obtained from the base of the skull through the vertex without intravenous contrast. COMPARISON:  03/03/2017 FINDINGS: Brain: Old infarcts with encephalomalacia noted in the left frontal and parietal lobes. There is atrophy and chronic small vessel disease changes. No acute intracranial abnormality. Specifically, no hemorrhage, hydrocephalus, mass lesion, acute infarction, or significant intracranial injury. Vascular: No hyperdense vessel or unexpected calcification. Skull: No acute calvarial abnormality. Sinuses/Orbits: No acute findings Other: None IMPRESSION: Old left MCA infarcts with encephalomalacia. Atrophy, chronic microvascular disease. No acute intracranial abnormality. Electronically Signed   By: Rolm Baptise M.D.   On: 04/08/2021 18:17   CT Chest W Contrast  Result Date: 04/08/2021 CLINICAL DATA:  Evaluate right hilar mass seen on chest x-ray EXAM: CT CHEST WITH CONTRAST TECHNIQUE: Multidetector CT imaging of the chest was performed during intravenous contrast administration. CONTRAST:  67m OMNIPAQUE IOHEXOL 350 MG/ML SOLN COMPARISON:  Chest x-ray today FINDINGS: Cardiovascular: Coronary artery and aortic calcifications. Heart is normal size. Aorta is normal caliber. Mediastinum/Nodes: Borderline sized mediastinal lymph nodes. Right paratracheal lymph node has a short axis diameter of 10 mm. No hilar or axillary  adenopathy. Lungs/Pleura: Centrilobular and paraseptal emphysema. Anterior right upper lobe mass measures 3.7 x 2.6 cm on image 63 of series 3, corresponding to the density seen on chest x-ray. Appearance is concerning for bronchogenic carcinoma. No effusions. Left lung clear. Upper Abdomen: Imaging into the upper abdomen demonstrates no acute findings. Musculoskeletal: Chest wall soft tissues are  unremarkable. No acute bony abnormality. IMPRESSION: Inferior anterior right upper lobe mass measuring up to 3.6 cm concerning for primary lung cancer. Borderline sized mediastinal lymph nodes. Coronary artery disease. Aortic Atherosclerosis (ICD10-I70.0) and Emphysema (ICD10-J43.9). Electronically Signed   By: Rolm Baptise M.D.   On: 04/08/2021 19:44   DG Chest Port 1 View  Result Date: 04/08/2021 CLINICAL DATA:  Increased weakness, history of stroke EXAM: PORTABLE CHEST 1 VIEW COMPARISON:  06/03/2015 FINDINGS: Single frontal view of the chest demonstrates an unremarkable cardiac silhouette. There is a 3.4 x 3.7 cm right hilar mass, concerning for underlying neoplasm. Chest CT is recommended for further evaluation. No airspace disease, effusion, or pneumothorax. No acute bony abnormalities. IMPRESSION: 1. 3.7 cm right hilar mass. CT chest with IV contrast is recommended for further evaluation. 2. No acute airspace disease. Electronically Signed   By: Randa Ngo M.D.   On: 04/08/2021 18:26    Labs on Admission: I have personally reviewed following labs  CBC: Recent Labs  Lab 04/08/21 1718  WBC 9.1  NEUTROABS 6.9  HGB 12.9  HCT 38.2  MCV 101.9*  PLT 817   Basic Metabolic Panel: Recent Labs  Lab 04/08/21 1718  NA 138  K 4.0  CL 104  CO2 25  GLUCOSE 122*  BUN 26*  CREATININE 1.14*  CALCIUM 9.3  MG 1.9   GFR: Estimated Creatinine Clearance: 44.3 mL/min (A) (by C-G formula based on SCr of 1.14 mg/dL (H)).  Liver Function Tests: Recent Labs  Lab 04/08/21 1718  AST 27  ALT 16  ALKPHOS  66  BILITOT 1.0  PROT 7.4  ALBUMIN 4.1   Urine analysis:    Component Value Date/Time   COLORURINE YELLOW (A) 04/08/2021 2027   APPEARANCEUR HAZY (A) 04/08/2021 2027   LABSPEC 1.013 04/08/2021 2027   PHURINE 5.0 04/08/2021 2027   GLUCOSEU NEGATIVE 04/08/2021 2027   HGBUR NEGATIVE 04/08/2021 2027   BILIRUBINUR NEGATIVE 04/08/2021 2027   KETONESUR NEGATIVE 04/08/2021 2027   PROTEINUR NEGATIVE 04/08/2021 2027   NITRITE NEGATIVE 04/08/2021 2027   LEUKOCYTESUR TRACE (A) 04/08/2021 2027   Dr. Tobie Poet [facility]  If 7PM-7AM, please contact overnight-coverage provider If 7AM-7PM, please contact day coverage provider [email or web address]  04/08/2021, 9:13 PM

## 2021-04-08 NOTE — ED Notes (Signed)
Pt transported on monitor to CT & back with this RN.    Pt then assisted to toilet, able to void sans complications. Standby assist with gait to toilet & back. Urine Sample obtained.    Back to monitor, VSS, warm blankets given, call light in reach. Will continue to monitor.

## 2021-04-08 NOTE — ED Notes (Signed)
US at bedside with patient
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
## 2021-04-08 NOTE — ED Notes (Addendum)
Per secure chat message with MD Cox, titrate dilt gtt down to 2.5mg /hr now.  MD aware of need for new order

## 2021-04-08 NOTE — ED Triage Notes (Signed)
Patient complains of weakness in her legs, headache and slurred speech., numbness right upper corner of mouth. She states she did not tell EMS about numbness of mouth or bad headache.

## 2021-04-09 ENCOUNTER — Encounter: Payer: Self-pay | Admitting: Internal Medicine

## 2021-04-09 ENCOUNTER — Encounter: Payer: Self-pay | Admitting: *Deleted

## 2021-04-09 ENCOUNTER — Observation Stay: Payer: Medicare Other

## 2021-04-09 DIAGNOSIS — Z6823 Body mass index (BMI) 23.0-23.9, adult: Secondary | ICD-10-CM | POA: Diagnosis not present

## 2021-04-09 DIAGNOSIS — C3411 Malignant neoplasm of upper lobe, right bronchus or lung: Secondary | ICD-10-CM | POA: Diagnosis present

## 2021-04-09 DIAGNOSIS — I69351 Hemiplegia and hemiparesis following cerebral infarction affecting right dominant side: Secondary | ICD-10-CM | POA: Diagnosis not present

## 2021-04-09 DIAGNOSIS — G8929 Other chronic pain: Secondary | ICD-10-CM | POA: Diagnosis present

## 2021-04-09 DIAGNOSIS — I10 Essential (primary) hypertension: Secondary | ICD-10-CM | POA: Diagnosis present

## 2021-04-09 DIAGNOSIS — F17210 Nicotine dependence, cigarettes, uncomplicated: Secondary | ICD-10-CM | POA: Diagnosis present

## 2021-04-09 DIAGNOSIS — F419 Anxiety disorder, unspecified: Secondary | ICD-10-CM | POA: Diagnosis present

## 2021-04-09 DIAGNOSIS — Z20822 Contact with and (suspected) exposure to covid-19: Secondary | ICD-10-CM | POA: Diagnosis present

## 2021-04-09 DIAGNOSIS — M79604 Pain in right leg: Secondary | ICD-10-CM | POA: Diagnosis present

## 2021-04-09 DIAGNOSIS — E785 Hyperlipidemia, unspecified: Secondary | ICD-10-CM | POA: Diagnosis present

## 2021-04-09 DIAGNOSIS — I480 Paroxysmal atrial fibrillation: Secondary | ICD-10-CM | POA: Diagnosis present

## 2021-04-09 DIAGNOSIS — R918 Other nonspecific abnormal finding of lung field: Secondary | ICD-10-CM

## 2021-04-09 DIAGNOSIS — R0602 Shortness of breath: Secondary | ICD-10-CM | POA: Diagnosis present

## 2021-04-09 DIAGNOSIS — Z8249 Family history of ischemic heart disease and other diseases of the circulatory system: Secondary | ICD-10-CM | POA: Diagnosis not present

## 2021-04-09 DIAGNOSIS — Z7901 Long term (current) use of anticoagulants: Secondary | ICD-10-CM | POA: Diagnosis not present

## 2021-04-09 DIAGNOSIS — I4891 Unspecified atrial fibrillation: Secondary | ICD-10-CM | POA: Diagnosis not present

## 2021-04-09 DIAGNOSIS — K2100 Gastro-esophageal reflux disease with esophagitis, without bleeding: Secondary | ICD-10-CM | POA: Diagnosis present

## 2021-04-09 DIAGNOSIS — Z96641 Presence of right artificial hip joint: Secondary | ICD-10-CM | POA: Diagnosis present

## 2021-04-09 DIAGNOSIS — E46 Unspecified protein-calorie malnutrition: Secondary | ICD-10-CM | POA: Diagnosis present

## 2021-04-09 DIAGNOSIS — Z7982 Long term (current) use of aspirin: Secondary | ICD-10-CM | POA: Diagnosis not present

## 2021-04-09 DIAGNOSIS — Z803 Family history of malignant neoplasm of breast: Secondary | ICD-10-CM | POA: Diagnosis not present

## 2021-04-09 DIAGNOSIS — I69322 Dysarthria following cerebral infarction: Secondary | ICD-10-CM | POA: Diagnosis not present

## 2021-04-09 DIAGNOSIS — E440 Moderate protein-calorie malnutrition: Secondary | ICD-10-CM | POA: Insufficient documentation

## 2021-04-09 DIAGNOSIS — F32A Depression, unspecified: Secondary | ICD-10-CM | POA: Diagnosis present

## 2021-04-09 DIAGNOSIS — Z833 Family history of diabetes mellitus: Secondary | ICD-10-CM | POA: Diagnosis not present

## 2021-04-09 DIAGNOSIS — I69392 Facial weakness following cerebral infarction: Secondary | ICD-10-CM | POA: Diagnosis not present

## 2021-04-09 LAB — CBC
HCT: 35.6 % — ABNORMAL LOW (ref 36.0–46.0)
Hemoglobin: 11.7 g/dL — ABNORMAL LOW (ref 12.0–15.0)
MCH: 33 pg (ref 26.0–34.0)
MCHC: 32.9 g/dL (ref 30.0–36.0)
MCV: 100.3 fL — ABNORMAL HIGH (ref 80.0–100.0)
Platelets: 232 10*3/uL (ref 150–400)
RBC: 3.55 MIL/uL — ABNORMAL LOW (ref 3.87–5.11)
RDW: 14.5 % (ref 11.5–15.5)
WBC: 4.7 10*3/uL (ref 4.0–10.5)
nRBC: 0 % (ref 0.0–0.2)

## 2021-04-09 LAB — BASIC METABOLIC PANEL
Anion gap: 5 (ref 5–15)
BUN: 17 mg/dL (ref 8–23)
CO2: 26 mmol/L (ref 22–32)
Calcium: 8.6 mg/dL — ABNORMAL LOW (ref 8.9–10.3)
Chloride: 109 mmol/L (ref 98–111)
Creatinine, Ser: 0.89 mg/dL (ref 0.44–1.00)
GFR, Estimated: >60 mL/min (ref >60)
Glucose, Bld: 101 mg/dL — ABNORMAL HIGH (ref 70–99)
Potassium: 3.5 mmol/L (ref 3.5–5.1)
Sodium: 140 mmol/L (ref 135–145)

## 2021-04-09 LAB — VITAMIN D 25 HYDROXY (VIT D DEFICIENCY, FRACTURES): Vit D, 25-Hydroxy: 19.72 ng/mL — ABNORMAL LOW (ref 30–100)

## 2021-04-09 LAB — LIPID PANEL
Cholesterol: 115 mg/dL (ref 0–200)
HDL: 48 mg/dL (ref >40)
LDL Cholesterol: 57 mg/dL (ref 0–99)
Total CHOL/HDL Ratio: 2.4 RATIO
Triglycerides: 49 mg/dL (ref <150)
VLDL: 10 mg/dL (ref 0–40)

## 2021-04-09 LAB — VITAMIN B12: Vitamin B-12: 218 pg/mL (ref 180–914)

## 2021-04-09 MED ORDER — ADULT MULTIVITAMIN W/MINERALS CH
1.0000 | ORAL_TABLET | Freq: Every day | ORAL | Status: DC
  Administered 2021-04-09 – 2021-04-10 (×2): 1 via ORAL
  Filled 2021-04-09 (×2): qty 1

## 2021-04-09 MED ORDER — VITAMIN D 25 MCG (1000 UNIT) PO TABS
1000.0000 [IU] | ORAL_TABLET | Freq: Every day | ORAL | Status: DC
  Administered 2021-04-09 – 2021-04-10 (×2): 1000 [IU] via ORAL
  Filled 2021-04-09 (×2): qty 1

## 2021-04-09 MED ORDER — ASPIRIN EC 81 MG PO TBEC
81.0000 mg | DELAYED_RELEASE_TABLET | Freq: Every day | ORAL | Status: DC
  Administered 2021-04-10: 81 mg via ORAL
  Filled 2021-04-09: qty 1

## 2021-04-09 MED ORDER — DILTIAZEM HCL 30 MG PO TABS
30.0000 mg | ORAL_TABLET | Freq: Four times a day (QID) | ORAL | Status: DC
  Filled 2021-04-09: qty 1

## 2021-04-09 NOTE — Evaluation (Signed)
Physical Therapy Evaluation Patient Details Name: Andrea Kennedy MRN: [id number] DOB: 1946-06-13 Today's Date: 04/09/2021   History of Present Illness  Pt is a 75 y.o. F arriving to ED w/ concerns of weakness, admitted for a-fib w/ RVR and concerns for  R upper lobe mass w/ unintential weight loss. PMH includes chronic pain, hx of MCA CVA, HTN, HLD, R THA  Clinical Impression  Pt alert, oriented x 4. Pt is pleasant and conversive during evaluation, notes chronic dizziness at rest and mobility. Vitals assessed throughout treatment (see below). Pt is tachycardic seated EOB, HR 120s and standing HR 140-150s. Telemetry lines checked by RN w/ RN recommending further rest limiting further evaluation. Pt noted dizziness throughout treatment, which decreases slightly along with tight pain along ant/lat R ribcage, & posterior mid-back.   Pt required min-guard, HOB elevated for supine > sit, min-A supine > sit for LE management. Pt transfers w/ a RW, min-guard w/ verbal cues for technique x 1. Pt demonstrated proper hand placement after cues. Pt stepped forward, backward w/ RW w/ instability needing close min-guard. No LOB noted, but BUE support required.Skilled PT intervention is indicated to address deficits in function, mobility, and to return to PLOF as able.  Discharge recommendations are SNF to maximize functional mobility.    Follow Up Recommendations SNF;Supervision for mobility/OOB    Equipment Recommendations  3in1 (PT)    Recommendations for Other Services       Precautions / Restrictions Precautions Precautions: Fall Restrictions Weight Bearing Restrictions: No      Mobility  Bed Mobility Overal bed mobility: Needs Assistance Bed Mobility: Supine to Sit;Sit to Supine     Supine to sit: Min guard;HOB elevated Sit to supine: Min assist   General bed mobility comments: supine > sit utilizes bed rail, HOB elevated; sit > supine min-A for LE assistance     Transfers Overall transfer level: Needs assistance Equipment used: Rolling walker (2 wheeled) Transfers: Sit to/from Stand Sit to Stand: Min guard         General transfer comment: Verbal cues for technique, pt able to stand from bed height w/o physical assist  Ambulation/Gait   Gait Distance (Feet): 2 Feet Assistive device: Rolling walker (2 wheeled) Gait Pattern/deviations: Step-to pattern;Narrow base of support     General Gait Details: steps forward, backwards w/ unsteady gait, no LOB but requies close min-gaurd  Stairs            Wheelchair Mobility    Modified Rankin (Stroke Patients Only)       Balance Overall balance assessment: Needs assistance Sitting-balance support: Feet supported Sitting balance-Leahy Scale: Good       Standing balance-Leahy Scale: Poor Standing balance comment: Requires BUE support                             Pertinent Vitals/Pain Pain Assessment: Faces Faces Pain Scale: Hurts even more Pain Location: R side mid back, ant/lateral rib cage Pain Descriptors / Indicators: Tightness;Aching;Discomfort Pain Intervention(s): Limited activity within patient's tolerance;Monitored during session;Repositioned;Relaxation    Home Living Family/patient expects to be discharged to:: Private residence Living Arrangements: Spouse/significant other Available Help at Discharge: Family;Available 24 hours/day Type of Home: Apartment Home Access: Stairs to enter Entrance Stairs-Rails: None Entrance Stairs-Number of Steps: 1 Home Layout: One level Home Equipment: Walker - 2 wheels;Walker - 4 wheels;Cane - quad      Prior Function Level of Independence: Needs assistance   Gait /  Transfers Assistance Needed: Pt is ambulates w/ 4-RW household distances, intermittently uses SPC or furniture/wall.  ADL's / Homemaking Assistance Needed: Pt reported her husband cooks because she is unsafe in the kitchen  Comments: Pt has  expressive difficulties following previous CVA     Hand Dominance   Dominant Hand: Right    Extremity/Trunk Assessment   Upper Extremity Assessment Upper Extremity Assessment: Generalized weakness (able to raise UE to 90 degrees)    Lower Extremity Assessment Lower Extremity Assessment: Generalized weakness RLE Deficits / Details: MMT 4/5 hip flexion, knee ext, dorsiflexion; RLE Sensation: decreased light touch (Decreased sensation dorsal surface) LLE Deficits / Details: MMT 4/5 hip flexion, knee ext, dorsiflexion; LLE Sensation: WNL       Communication   Communication: Expressive difficulties  Cognition Arousal/Alertness: Awake/alert Behavior During Therapy: WFL for tasks assessed/performed Overall Cognitive Status: Within Functional Limits for tasks assessed                                 General Comments: AOx4      General Comments      Exercises     Assessment/Plan    PT Assessment Patient needs continued PT services  PT Problem List Decreased strength;Decreased range of motion;Decreased activity tolerance;Decreased balance;Decreased mobility       PT Treatment Interventions Balance training;Gait training;Neuromuscular re-education;Stair training;Functional mobility training;Therapeutic activities;Therapeutic exercise    PT Goals (Current goals can be found in the Care Plan section)  Acute Rehab PT Goals Patient Stated Goal: To go home PT Goal Formulation: With patient Time For Goal Achievement: 04/23/21 Potential to Achieve Goals: Fair    Frequency Min 2X/week   Barriers to discharge        Co-evaluation               AM-PAC PT "6 Clicks" Mobility  Outcome Measure Help needed turning from your back to your side while in a flat bed without using bedrails?: A Little Help needed moving from lying on your back to sitting on the side of a flat bed without using bedrails?: A Little Help needed moving to and from a bed to a chair  (including a wheelchair)?: A Little Help needed standing up from a chair using your arms (e.g., wheelchair or bedside chair)?: A Little Help needed to walk in hospital room?: A Lot Help needed climbing 3-5 steps with a railing? : A Lot 6 Click Score: 16    End of Session Equipment Utilized During Treatment: Gait belt Activity Tolerance: Treatment limited secondary to medical complications (Comment) Patient left: in bed;with call bell/phone within reach;with bed alarm set;with SCD's reapplied Nurse Communication: Mobility status;Other (comment) (heart rate) PT Visit Diagnosis: Unsteadiness on feet (R26.81);Other abnormalities of gait and mobility (R26.89);Muscle weakness (generalized) (M62.81);History of falling (Z91.81)    Time: 1444-1530 PT Time Calculation (min) (ACUTE ONLY): 46 min   Charges:             [employer], SPT

## 2021-04-09 NOTE — Progress Notes (Signed)
[facility] [place] NAME: Andrea Kennedy    MR#:  [id number]  DATE OF BIRTH:  11/09/1945  SUBJECTIVE:  came in with generalized weakness and found to be in a fib with RVR. Was on diltiazem drip now discontinued. Patient overall feels better. No family in the room. Has chronic dysarthria from previous stroke.  REVIEW OF SYSTEMS:   Review of Systems  Constitutional:  Negative for chills, fever and weight loss.  HENT:  Negative for ear discharge, ear pain and nosebleeds.   Eyes:  Negative for blurred vision, pain and discharge.  Respiratory:  Negative for sputum production, shortness of breath, wheezing and stridor.   Cardiovascular:  Negative for chest pain, palpitations, orthopnea and PND.  Gastrointestinal:  Negative for abdominal pain, diarrhea, nausea and vomiting.  Genitourinary:  Negative for frequency and urgency.  Musculoskeletal:  Negative for back pain and joint pain.  Neurological:  Positive for weakness. Negative for sensory change, speech change and focal weakness.  Psychiatric/Behavioral:  Negative for depression and hallucinations. The patient is not nervous/anxious.   Tolerating Diet:yes Tolerating PT: pending  DRUG ALLERGIES:   Allergies  Allergen Reactions  . Floxin [Ofloxacin] Other (See Comments)    Scared/shaky/pain attacks/confusion  . Levaquin [Levofloxacin In D5w]     VITALS:  Blood pressure 97/68, pulse 64, temperature 98.7 F (37.1 C), resp. rate 15, height 5\' 8"  (1.727 m), weight 71.1 kg, SpO2 95 %.  PHYSICAL EXAMINATION:   Physical Exam  GENERAL:  75 y.o.-year-old patient lying in the bed with no acute distress.  LUNGS: Normal breath sounds bilaterally, no wheezing, rales, rhonchi. No use of accessory muscles of respiration.  CARDIOVASCULAR: S1, S2 normal. No murmurs, rubs, or gallops.  ABDOMEN: Soft, nontender, nondistended. Bowel sounds present. No organomegaly or mass.  EXTREMITIES: No cyanosis,  clubbing or edema b/l.    NEUROLOGIC:  PSYCHIATRIC:  patient is alert and oriented x 3.  SKIN: No obvious rash, lesion, or ulcer.   LABORATORY PANEL:  CBC Recent Labs  Lab 04/09/21 0430  WBC 4.7  HGB 11.7*  HCT 35.6*  PLT 232    Chemistries  Recent Labs  Lab 04/08/21 1718 04/09/21 0430  NA 138 140  K 4.0 3.5  CL 104 109  CO2 25 26  GLUCOSE 122* 101*  BUN 26* 17  CREATININE 1.14* 0.89  CALCIUM 9.3 8.6*  MG 1.9  --   AST 27  --   ALT 16  --   ALKPHOS 66  --   BILITOT 1.0  --    Cardiac Enzymes No results for input(s): TROPONINI in the last 168 hours. RADIOLOGY:  CT HEAD WO CONTRAST (5MM)  Result Date: 04/08/2021 CLINICAL DATA:  Right side weakness, on Eliquis. EXAM: CT HEAD WITHOUT CONTRAST TECHNIQUE: Contiguous axial images were obtained from the base of the skull through the vertex without intravenous contrast. COMPARISON:  03/03/2017 FINDINGS: Brain: Old infarcts with encephalomalacia noted in the left frontal and parietal lobes. There is atrophy and chronic small vessel disease changes. No acute intracranial abnormality. Specifically, no hemorrhage, hydrocephalus, mass lesion, acute infarction, or significant intracranial injury. Vascular: No hyperdense vessel or unexpected calcification. Skull: No acute calvarial abnormality. Sinuses/Orbits: No acute findings Other: None IMPRESSION: Old left MCA infarcts with encephalomalacia. Atrophy, chronic microvascular disease. No acute intracranial abnormality. Electronically Signed   By: Rolm Baptise M.D.   On: 04/08/2021 18:17   CT Chest W Contrast  Result Date: 04/08/2021 CLINICAL DATA:  Evaluate right hilar mass seen on chest x-ray EXAM: CT CHEST WITH CONTRAST TECHNIQUE: Multidetector CT imaging of the chest was performed during intravenous contrast administration. CONTRAST:  63mL OMNIPAQUE IOHEXOL 350 MG/ML SOLN COMPARISON:  Chest x-ray today FINDINGS: Cardiovascular: Coronary artery and aortic calcifications. Heart is normal  size. Aorta is normal caliber. Mediastinum/Nodes: Borderline sized mediastinal lymph nodes. Right paratracheal lymph node has a short axis diameter of 10 mm. No hilar or axillary adenopathy. Lungs/Pleura: Centrilobular and paraseptal emphysema. Anterior right upper lobe mass measures 3.7 x 2.6 cm on image 63 of series 3, corresponding to the density seen on chest x-ray. Appearance is concerning for bronchogenic carcinoma. No effusions. Left lung clear. Upper Abdomen: Imaging into the upper abdomen demonstrates no acute findings. Musculoskeletal: Chest wall soft tissues are unremarkable. No acute bony abnormality. IMPRESSION: Inferior anterior right upper lobe mass measuring up to 3.6 cm concerning for primary lung cancer. Borderline sized mediastinal lymph nodes. Coronary artery disease. Aortic Atherosclerosis (ICD10-I70.0) and Emphysema (ICD10-J43.9). Electronically Signed   By: Rolm Baptise M.D.   On: 04/08/2021 19:44   MR BRAIN WO CONTRAST  Result Date: 04/09/2021 CLINICAL DATA:  Acute neuro deficit EXAM: MRI HEAD WITHOUT CONTRAST TECHNIQUE: Multiplanar, multiecho pulse sequences of the brain and surrounding structures were obtained without intravenous contrast. COMPARISON:  MRI head 03/13/2017 FINDINGS: Brain: Chronic infarct left frontal parietal lobe with small amount of chronic blood products. This has progressed in size since 2018. Chronic microvascular ischemic change in the white matter. Negative for acute infarct or mass.  Ventricle size normal. Vascular: Normal arterial flow voids Skull and upper cervical spine: No focal skeletal lesion Sinuses/Orbits: Negative Other: None IMPRESSION: No acute abnormality Chronic left MCA infarct left frontal parietal lobe has progressed since 2018. Chronic microvascular ischemic change in the white matter similar to the prior study. No acute infarct. Electronically Signed   By: Franchot Gallo M.D.   On: 04/09/2021 08:03   US Venous Img Lower Unilateral Right  (DVT)  Result Date: 04/08/2021 CLINICAL DATA:  Right lower quadrant pain for 1 month. Pain, edema, tobacco use, varicose veins, anticoagulation with Eliquis. EXAM: Right LOWER EXTREMITY VENOUS DOPPLER ULTRASOUND TECHNIQUE: Gray-scale sonography with compression, as well as color and duplex ultrasound, were performed to evaluate the deep venous system(s) from the level of the common femoral vein through the popliteal and proximal calf veins. COMPARISON:  None. FINDINGS: VENOUS Normal compressibility of the common femoral, superficial femoral, and popliteal veins, as well as the visualized calf veins. Visualized portions of profunda femoral vein and great saphenous vein unremarkable. No filling defects to suggest DVT on grayscale or color Doppler imaging. Doppler waveforms show normal direction of venous flow, normal respiratory plasticity and response to augmentation. Limited views of the contralateral common femoral vein are unremarkable. OTHER None. Limitations: none IMPRESSION: No evidence of acute deep venous thrombosis in the visualized veins of the lower extremity. Electronically Signed   By: Lucienne Capers M.D.   On: 04/08/2021 21:45   DG Chest Port 1 View  Result Date: 04/08/2021 CLINICAL DATA:  Increased weakness, history of stroke EXAM: PORTABLE CHEST 1 VIEW COMPARISON:  06/03/2015 FINDINGS: Single frontal view of the chest demonstrates an unremarkable cardiac silhouette. There is a 3.4 x 3.7 cm right hilar mass, concerning for underlying neoplasm. Chest CT is recommended for further evaluation. No airspace disease, effusion, or pneumothorax. No acute bony abnormalities. IMPRESSION: 1. 3.7 cm right hilar mass. CT chest with IV contrast is recommended for further evaluation. 2. No  acute airspace disease. Electronically Signed   By: Randa Ngo M.D.   On: 04/08/2021 18:26   ASSESSMENT AND PLAN:    Gorgeous Newlun is a 75 y.o. female with medical history significant for tobacco abuse,  hyperlipidemia, history of old left MCA, hypertension, atrial fibrillation on anticoagulation, presents emergency department for chief concerns of generalized malaise, weakness, headaches and slurred speech.  acute on chronic a fib with RVR -- patient was on diltiazem drip now discontinued -- placed on PO Cardizem 30 mg Q6 with holding parameters -- heart rate 82-- 90 -- continue eliquis (shown on chronic) -- [place] lower extremity negative for DVT  right upper lobe hilar mass with history of heavy tobacco abuse suspicious for bronchogenic carcinoma -- discussed with Dr. Grayland Ormond on call -- patient will be seen at the cancer center as outpatient appointment will be made by Dr. Gary Fleet office. -- smoking cessation advised  history of CVA with right facial droop and right-sided weakness chronic -- PT OT to see patient -- continue eliquis -- on aspirin -- MRI negative for acute CVA shows old MC infarct  Hyperlipidemia Statins  depression/anxiety -- continue Atarax    Procedures: Family communication : left message for [employer] Consults : oncology CODE STATUS: full DVT Prophylaxis : eliquis Level of care: Progressive Cardiac Status is: Inpatient  Remains inpatient appropriate because:Inpatient level of care appropriate due to severity of illness  Dispo: The patient is from: Home              Anticipated d/c is to: Home              Patient currently is not medically stable to d/c.   Difficult to place patient No        TOTAL TIME TAKING CARE OF THIS PATIENT: 25 minutes.  >50% time spent on counselling and coordination of care  Note: This dictation was prepared with Dragon dictation along with smaller phrase technology. Any transcriptional errors that result from this process are unintentional.  Fritzi Mandes M.D    [facility]   CC: Primary care physician; Baxter Hire, MD Patient ID: Andee Poles, female   DOB: 1946/07/11, 75 y.o.   MRN:  [id number]

## 2021-04-09 NOTE — Progress Notes (Signed)
OT Cancellation Note  Patient Details Name: Andrea Kennedy MRN: [id number] DOB: [birth year]   Cancelled Treatment:    Reason Eval/Treat Not Completed: Medical issues which prohibited therapy. Order received and chart reviewed. Per conversation with PT, pt noted to have HR 140-150s in standing and RN in room to assess. OK per MD to hold this date and initiate services next date as appropriate.   Dessie Coma, M.S. OTR/L  04/09/21, 3:48 PM  ascom [phone]

## 2021-04-09 NOTE — Progress Notes (Signed)
Patient is a 75 year old female who was initially admitted to the hospital with increasing weakness and fatigue.  She has a long history of tobacco use.  Work-up included CT scan of the chest which revealed a large 3.6 cm lung mass in the right upper lobe highly suspicious for underlying malignancy and compatible with a primary bronchogenic lung carcinoma.    Patient has been referred to the [facility] and will require a PET scan for further evaluation and to optimize management.  PET scan is also needed to assist in further diagnostic studies including possible biopsy.

## 2021-04-09 NOTE — Progress Notes (Signed)
Initial Nutrition Assessment  DOCUMENTATION CODES:  Non-severe (moderate) malnutrition in context of chronic illness  INTERVENTION:  Add Carnation Instant Breakfast po TID, each supplement provides 140 kcal and 5 grams of protein.  Add Magic cup TID with meals, each supplement provides 290 kcal and 9 grams of protein.  Add MVI with minerals daily.  Encourage PO intake.  NUTRITION DIAGNOSIS:  Moderate Malnutrition related to chronic illness (likely lung malignancy) as evidenced by mild fat depletion, mild muscle depletion, moderate muscle depletion.  GOAL:  Patient will meet greater than or equal to 90% of their needs  MONITOR:  PO intake, Supplement acceptance, Labs, Weight trends, I & O's  REASON FOR ASSESSMENT:  Consult Assessment of nutrition requirement/status  ASSESSMENT:  75 yo female with a PMH of tobacco abuse, HLD, old L MCA, HTN, and A-fib who presents with A-fib with RVR and R upper lobe mass (suspicion for primary lung cancer).  Per H&P, "She endorses unintentional 7-8 pound weight loss in the last 3 months. She reports that she eats a lot however, she still has weight loss. She states her husband tries to get her to eat more but she can not eat more than she is already."  Spoke with pt at bedside. Pt endorses above information. She also included that she has poor dentition and it sometimes makes chewing difficult. She reports her husband cooks for her at most meals. She endorses poor appetite, but she makes herself eat. 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  No recent weight history in Epic. Per Care Everywhere, on 01/13/21, pt weighed 65.8 kg and on admission, weighed 71.1 kg. Pt's weight appears to have increased.  On exam, pt with mild to moderate depletions. Pt stronger on left side than right likely due to sciatica.  Recommend adding Magic Cup TID, [employer] Breakfast TID with whole milk, and MVI with minerals daily.  Medications:  reviewed; Ativan PRN (given once today)  Labs: reviewed; Glucose 101 (H)  NUTRITION - FOCUSED PHYSICAL EXAM: Flowsheet Row Most Recent Value  Orbital Region Mild depletion  Upper Arm Region Mild depletion  Thoracic and Lumbar Region No depletion  Buccal Region No depletion  Temple Region Moderate depletion  Clavicle Bone Region Mild depletion  Clavicle and Acromion Bone Region Mild depletion  Scapular Bone Region Moderate depletion  Dorsal Hand Moderate depletion  Patellar Region Moderate depletion  Anterior Thigh Region Moderate depletion  Posterior Calf Region Moderate depletion  Edema (RD Assessment) None  Hair Reviewed  Eyes Reviewed  Mouth Reviewed  Skin Reviewed  Nails Reviewed   Diet Order:   Diet Order             Diet Heart Room service appropriate? Yes; Fluid consistency: Thin  Diet effective now                  EDUCATION NEEDS:  Education needs have been addressed  Skin:  Skin Assessment: Reviewed RN Assessment  Last BM:  04/08/21  Height:  Ht Readings from Last 1 Encounters:  04/08/21 5\' 8"  (1.727 m)   Weight:  Wt Readings from Last 1 Encounters:  04/08/21 71.1 kg   BMI:  Body mass index is 23.83 kg/m.  Estimated Nutritional Needs:  Kcal:  1850-2050 Protein:  85-100 grams Fluid:  >1.85 L  Derrel Nip, RD, LDN (she/her/hers) Registered Dietitian I After-Hours/Weekend Pager # in [phone]

## 2021-04-09 NOTE — Plan of Care (Signed)
  Problem: Health Behavior/Discharge Planning: Goal: Ability to manage health-related needs will improve Outcome: Progressing   Problem: Clinical Measurements: Goal: Ability to maintain clinical measurements within normal limits will improve Outcome: Progressing   Problem: Clinical Measurements: Goal: Will remain free from infection Outcome: Progressing   Problem: Clinical Measurements: Goal: Respiratory complications will improve Outcome: Progressing   Problem: Clinical Measurements: Goal: Cardiovascular complication will be avoided Outcome: Progressing   Problem: Activity: Goal: Risk for activity intolerance will decrease Outcome: Progressing   Problem: Nutrition: Goal: Adequate nutrition will be maintained Outcome: Progressing   Problem: Coping: Goal: Level of anxiety will decrease Outcome: Progressing

## 2021-04-09 NOTE — Progress Notes (Signed)
Per tele pt had 2.29 second pause.  Pt asymptomatic. MD [facility] notified. Was told to hold cardizem drip. Will continue to monitor.

## 2021-04-09 NOTE — Plan of Care (Signed)

## 2021-04-10 DIAGNOSIS — I4891 Unspecified atrial fibrillation: Secondary | ICD-10-CM | POA: Diagnosis not present

## 2021-04-10 MED ORDER — VITAMIN D3 25 MCG PO TABS: 1000.0000 [IU] | ORAL_TABLET | Freq: Every day | ORAL | 3 refills | Status: AC

## 2021-04-10 MED ORDER — ATORVASTATIN CALCIUM 40 MG PO TABS: 40.0000 mg | ORAL_TABLET | Freq: Every day | ORAL | Status: AC

## 2021-04-10 MED ORDER — DILTIAZEM HCL 30 MG PO TABS: 60.0000 mg | ORAL_TABLET | Freq: Three times a day (TID) | ORAL | Status: DC

## 2021-04-10 MED ORDER — NICOTINE 14 MG/24HR TD PT24: 14.0000 mg | MEDICATED_PATCH | Freq: Every day | TRANSDERMAL | 0 refills | Status: DC

## 2021-04-10 MED ORDER — DILTIAZEM HCL ER COATED BEADS 120 MG PO CP24: 120.0000 mg | ORAL_CAPSULE | Freq: Every day | ORAL | 1 refills | Status: AC

## 2021-04-10 MED ORDER — DILTIAZEM HCL ER COATED BEADS 120 MG PO CP24
120.0000 mg | ORAL_CAPSULE | Freq: Every day | ORAL | Status: DC
  Administered 2021-04-10: 120 mg via ORAL
  Filled 2021-04-10: qty 1

## 2021-04-10 NOTE — Evaluation (Addendum)
Occupational Therapy Evaluation Patient Details Name: Andrea Kennedy MRN: [id number] DOB: [birth year] Today's Date: 04/10/2021    History of Present Illness Pt is a 75 y.o. F arriving to ED w/ concerns of weakness, admitted for a-fib w/ RVR and concerns for  R upper lobe mass w/ unintential weight loss. PMH includes chronic pain, hx of MCA CVA, HTN, HLD, R THA   Clinical Impression   [name] was seen for OT evaluation this date. Prior to hospital admission, pt was MOD I for mobility and ADLs using 4WW, PRN assist for tub t/fs. Pt lives in apartment c [address] and husband available 24/7. Pt presents to acute OT demonstrating impaired ADL performance and functional mobility 2/2 decreased activity tolerance and functional balance/strength deficits. Pt currently requires MOD I for LB access seated EOB, SBA to pull underwear up over rear in standing c no LOBs noted. CGA + RW for toilet t/f, trialed without RW at pt request and pt maintained CGA c no LOBs noted or attempts at furniture walking.   Pt would benefit from skilled OT to address noted impairments and functional limitations (see below for any additional details) in order to maximize safety and independence while minimizing falls risk and caregiver burden. Upon hospital discharge, recommend HHOT to maximize pt safety and return to functional independence during meaningful occupations of daily life.     Follow Up Recommendations  Home health OT;Supervision/Assistance - 24 hour    Equipment Recommendations  3 in 1 bedside commode    Recommendations for Other Services       Precautions / Restrictions Precautions Precautions: Fall Restrictions Weight Bearing Restrictions: No      Mobility Bed Mobility Overal bed mobility: Needs Assistance Bed Mobility: Supine to Sit;Rolling Rolling: Modified independent (Device/Increase time)   Supine to sit: Modified independent (Device/Increase time)          Transfers Overall  transfer level: Needs assistance Equipment used: Rolling walker (2 wheeled) Transfers: Sit to/from Stand Sit to Stand: Supervision         General transfer comment: SBA + RW sit<>stand from bed and recliner height, VCs for poor eccentric control    Balance Overall balance assessment: Needs assistance Sitting-balance support: Feet supported Sitting balance-Leahy Scale: Good     Standing balance support: No upper extremity supported;During functional activity Standing balance-Leahy Scale: Fair                             ADL either performed or assessed with clinical judgement   ADL Overall ADL's : Needs assistance/impaired                                       General ADL Comments: MOD I for LB access seated EOB, SBA to pull underwear up over rear in standing c no LOBs noted. CGA + RW for toilet t/f, trialed without RW at pt request and pt maintained CGA c no LOBs noted or attempts at furniture walking.      Pertinent Vitals/Pain Pain Assessment: No/denies pain     Hand Dominance Right   Extremity/Trunk Assessment Upper Extremity Assessment Upper Extremity Assessment: Generalized weakness   Lower Extremity Assessment Lower Extremity Assessment: Generalized weakness       Communication Communication Communication: Expressive difficulties   Cognition Arousal/Alertness: Awake/alert Behavior During Therapy: WFL for tasks assessed/performed Overall Cognitive Status: Within  Functional Limits for tasks assessed                                     General Comments  Highest HR in standing 129 bpm    Exercises Exercises: Other exercises Other Exercises Other Exercises: Pt edcuated re: OT role, DME recs, d/c recs, falls prevention, ECS Other Exercises: LBD, toileting, sup>sit, sit<>stand x2, sitting/standing balance/toelrance, ~30 ft mobility   Shoulder Instructions      Home Living Family/patient expects to be discharged  to:: Private residence Living Arrangements: Spouse/significant other Available Help at Discharge: Family;Available 24 hours/day Type of Home: Apartment Home Access: Stairs to enter Entrance Stairs-Number of Steps: 1 Entrance Stairs-Rails: None Home Layout: One level     Bathroom Shower/Tub: Teacher, early years/pre: Standard     Home Equipment: Environmental consultant - 2 wheels;Walker - 4 wheels;Cane - quad   Additional Comments: reports they have no car, husband takes bus to grocery store and neighbour watches pt while he is gone      Prior Functioning/Environment Level of Independence: Needs assistance  Gait / Transfers Assistance Needed: MOD I c 4WW household distances, intermittently uses SPC or furniture/wall. ADL's / Homemaking Assistance Needed: Pt reported her husband cooks because she is unsafe in the kitchen and assists with bathing/tub t/f            OT Problem List: Decreased strength;Decreased range of motion;Decreased activity tolerance;Impaired balance (sitting and/or standing);Decreased safety awareness      OT Treatment/Interventions: Self-care/ADL training;Therapeutic exercise;DME and/or AE instruction;Energy conservation;Therapeutic activities;Patient/family education;Balance training    OT Goals(Current goals can be found in the care plan section) Acute Rehab OT Goals Patient Stated Goal: To go home OT Goal Formulation: With patient Time For Goal Achievement: 04/24/21 Potential to Achieve Goals: Good ADL Goals Pt Will Perform Grooming: Independently;standing Pt Will Perform Lower Body Dressing: Independently;sit to/from stand Pt Will Transfer to Toilet: ambulating;regular height toilet;with modified independence (c LRAD PRN)  OT Frequency: Min 2X/week    AM-PAC OT "6 Clicks" Daily Activity     Outcome Measure Help from another person eating meals?: None Help from another person taking care of personal grooming?: A Little Help from another person  toileting, which includes using toliet, bedpan, or urinal?: A Little Help from another person bathing (including washing, rinsing, drying)?: A Little Help from another person to put on and taking off regular upper body clothing?: None Help from another person to put on and taking off regular lower body clothing?: None 6 Click Score: 21   End of Session Equipment Utilized During Treatment: Rolling walker Nurse Communication: Mobility status  Activity Tolerance: Patient tolerated treatment well Patient left: in chair;with call bell/phone within reach;with chair alarm set  OT Visit Diagnosis: Other abnormalities of gait and mobility (R26.89);Muscle weakness (generalized) (M62.81)                Time: 1751-0258 OT Time Calculation (min): 17 min Charges:  OT General Charges $OT Visit: 1 Visit OT Evaluation $OT Eval Low Complexity: 1 Low OT Treatments $Self Care/Home Management : 8-22 mins  Dessie Coma, M.S. OTR/L  04/10/21, 9:52 AM  ascom [phone]
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
## 2021-04-10 NOTE — Progress Notes (Signed)
Pt discharged to home. DC instructions given with husband at bedside. No concerns voiced.  Pt and husband encouraged to stop by pharmacy and pick meds that were e-prescribed by Provider. Both voiced understanding. Pt left unit in wheelchair pushed by volunteer and accompanied by husband. Left in stable condition.  VWilliams,RN.

## 2021-04-10 NOTE — TOC Transition Note (Signed)
Transition of Care [facility]) - CM/SW Discharge Note   Patient Details  Name: Andrea Kennedy MRN: [id number] Date of Birth: [birth year]  Transition of Care [facility]) CM/SW Contact:  Izola Price, RN Phone Number: 04/10/2021, 11:49 AM   Clinical Narrative:   Patient is discharging today with Home/Home Health PT/OT services. [facility] via [facility]. Spouse says he has all DME needs and a BSC was delivered to room. Discussed final discharge plans and spouse did not have further questions or expressed needs. Simmie Davies RN CM     Final next level of care: Home w Home Health Services Barriers to Discharge: Barriers Resolved   Patient Goals and CMS Choice Patient states their goals for this hospitalization and ongoing recovery are:: Spouse states to go home again   Choice offered to / list presented to : Spouse  Discharge Placement                    Patient and family notified of of transfer: 04/10/21 (Spoke with husband about final dc plan/disposition to Home/HH)  Discharge Plan and Services   Discharge Planning Services: CM Consult Post Acute Care Choice: Durable Medical Equipment          DME Arranged:  [facility] in room per patient and staff.)         HH Arranged: PT, OT HH Agency: [place] Date [place]: 04/10/21 Time [place]: 1115 Representative spoke with at [place]: [facility] Brink  Social Determinants of Health (SDOH) Interventions     Readmission Risk Interventions No flowsheet data found.

## 2021-04-10 NOTE — Discharge Summary (Signed)
[facility] [place] NAME: Andrea Kennedy    MR#:  [id number]  DATE OF BIRTH:  September 03, 1945  DATE OF ADMISSION:  04/08/2021 ADMITTING PHYSICIAN: Amy N Cox, DO  DATE OF DISCHARGE: 04/10/2021  PRIMARY CARE PHYSICIAN: Baxter Hire, MD    ADMISSION DIAGNOSIS:  SOB (shortness of breath) [R06.02] Right leg pain [M79.604] Atrial fibrillation with RVR ([place]) [I48.91] Malignant neoplasm of upper lobe of right lung (HCC) [C34.11]  DISCHARGE DIAGNOSIS:  a fib with RVR right hilar lung mass-- new-- workup as outpatient with Dr. Grayland Ormond ongoing tobacco abuse  SECONDARY DIAGNOSIS:   Past Medical History:  Diagnosis Date  . Hyperlipidemia   . Hypertension   . Lupus ([place])   . Sciatica of right side   . Stroke ([place])   . Tobacco abuse     HOSPITAL COURSE:    Andrea Kennedy is a 75 y.o. female with medical history significant for tobacco abuse, hyperlipidemia, history of old left MCA, hypertension, atrial fibrillation on anticoagulation, presents emergency department for chief concerns of generalized malaise, weakness, headaches and slurred speech.   acute on chronic a fib with RVR -- patient was on diltiazem drip now discontinued -- placed on PO Cardizem 30 mg Q6 with holding parameters--now on Cardizem CD 120 mg qd at d/c -- heart rate 82-- 90 afib -- continue eliquis ( on chronically at home) -- [place] lower extremity negative for DVT   right upper lobe hilar mass with history of heavy tobacco abuse suspicious for bronchogenic carcinoma -- discussed with Dr. Grayland Ormond on call -- patient will be seen at the cancer center as outpatient appointment will be made by Dr. Gary Fleet office. -- smoking cessation advised --per Dr Grayland Ormond PET scan is scheduled for 9/8 at 8:30 am --husband is aware of it  history of CVA with right facial droop and right-sided weakness chronic -- PT OT to see patient -- continue eliquis -- on aspirin --  MRI negative for acute CVA shows old [place] infarct   Hyperlipidemia Statins   depression/anxiety -- continue Atarax    Family communication :  spoke with Myrene Galas on the phone Consults : oncology CODE STATUS: full DVT Prophylaxis : eliquis Level of care: Progressive Cardiac Status is: Inpatient    Dispo: The patient is from: Home              Anticipated d/c is to: Home with [facility]              Patient currently is medically optimized to d/c.              Difficult to place patient No     CONSULTS OBTAINED:    DRUG ALLERGIES:   Allergies  Allergen Reactions  . Floxin [Ofloxacin] Other (See Comments)    Scared/shaky/pain attacks/confusion  . Levaquin [Levofloxacin In D5w]     DISCHARGE MEDICATIONS:   Allergies as of 04/10/2021       Reactions   Floxin [ofloxacin] Other (See Comments)   Scared/shaky/pain attacks/confusion   Levaquin [levofloxacin In D5w]         Medication List     TAKE these medications    acetaminophen 325 MG tablet Commonly known as: TYLENOL Take 650 mg by mouth every 6 (six) hours as needed (for headache.).   ALIVE WOMENS 50+ PO Take 1 tablet by mouth daily.   apixaban 5 MG Tabs tablet Commonly known as: ELIQUIS Take 1 tablet (5 mg total) by  mouth 2 (two) times daily.   aspirin 325 MG tablet Take 1 tablet (325 mg total) by mouth daily.   atorvastatin 40 MG tablet Commonly known as: LIPITOR Take 1 tablet (40 mg total) by mouth daily at 6 PM.   diltiazem 120 MG 24 hr capsule Commonly known as: CARDIZEM CD Take 1 capsule (120 mg total) by mouth daily.   HYDROcodone-acetaminophen 5-325 MG tablet Commonly known as: NORCO/VICODIN Take 1 tablet by mouth every 8 (eight) hours as needed for moderate pain.   hydrOXYzine 25 MG tablet Commonly known as: ATARAX/VISTARIL Take 25 mg by mouth 3 (three) times daily as needed.   Iron 325 (65 Fe) MG Tabs Take 1 tablet (325 mg total) by mouth daily.   methocarbamol 500 MG tablet Commonly  known as: ROBAXIN Take 1 tablet (500 mg total) by mouth every 6 (six) hours as needed for muscle spasms.   nicotine 14 mg/24hr patch Commonly known as: NICODERM CQ - dosed in mg/24 hours Place 1 patch (14 mg total) onto the skin daily. Start taking on: April 11, 2021   Vitamin D3 25 MCG tablet Commonly known as: Vitamin D Take 1 tablet (1,000 Units total) by mouth daily. Start taking on: April 11, 2021               Durable Medical Equipment  (From admission, onward)           Start     Ordered   04/10/21 1040  For home use only DME Walker rolling  Once       Question Answer Comment  Walker: With [place] Wheels   Patient needs a walker to treat with the following condition Weakness      04/10/21 1039   04/10/21 1040  For home use only DME 3 n 1  Once        04/10/21 1039            If you experience worsening of your admission symptoms, develop shortness of breath, life threatening emergency, suicidal or homicidal thoughts you must seek medical attention immediately by calling 911 or calling your MD immediately  if symptoms less severe.  You Must read complete instructions/literature along with all the possible adverse reactions/side effects for all the Medicines you take and that have been prescribed to you. Take any new Medicines after you have completely understood and accept all the possible adverse reactions/side effects.   Please note  You were cared for by a hospitalist during your hospital stay. If you have any questions about your discharge medications or the care you received while you were in the hospital after you are discharged, you can call the unit and asked to speak with the hospitalist on call if the hospitalist that took care of you is not available. Once you are discharged, your primary care physician will handle any further medical issues. Please note that NO REFILLS for any discharge medications will be authorized once you are discharged, as it  is imperative that you return to your primary care physician (or establish a relationship with a primary care physician if you do not have one) for your aftercare needs so that they can reassess your need for medications and monitor your lab values. Today   SUBJECTIVE   Doing well HR in the 90's No cp ro sob Did work well with OT  VITAL SIGNS:  Blood pressure 120/73, pulse (!) 50, temperature 98 F (36.7 C), resp. rate 17, height 5\' 8"  (1.727  m), weight 71.1 kg, SpO2 94 %.  I/O:   Intake/Output Summary (Last 24 hours) at 04/10/2021 1054 Last data filed at 04/10/2021 1000 Gross per 24 hour  Intake 120 ml  Output 1050 ml  Net -930 ml    PHYSICAL EXAMINATION:  GENERAL:  75 y.o.-year-old patient lying in the bed with no acute distress.  LUNGS: Normal breath sounds bilaterally, no wheezing, rales,rhonchi or crepitation. No use of accessory muscles of respiration.  CARDIOVASCULAR: S1, S2 normal. No murmurs, rubs, or gallops. irregular ABDOMEN: Soft, non-tender, non-distended. Bowel sounds present. No organomegaly or mass.  EXTREMITIES: No pedal edema, cyanosis, or clubbing.  NEUROLOGIC: right side hemiparesis--chronic , chronic dysarthria PSYCHIATRIC: The patient is alert and oriented x 3.  SKIN: No obvious rash, lesion, or ulcer.   DATA REVIEW:   CBC  Recent Labs  Lab 04/09/21 0430  WBC 4.7  HGB 11.7*  HCT 35.6*  PLT 232    Chemistries  Recent Labs  Lab 04/08/21 1718 04/09/21 0430  NA 138 140  K 4.0 3.5  CL 104 109  CO2 25 26  GLUCOSE 122* 101*  BUN 26* 17  CREATININE 1.14* 0.89  CALCIUM 9.3 8.6*  MG 1.9  --   AST 27  --   ALT 16  --   ALKPHOS 66  --   BILITOT 1.0  --     Microbiology Results   Recent Results (from the past 240 hour(s))  Resp Panel by RT-PCR (Flu A&B, Covid) Nasopharyngeal Swab     Status: None   Collection Time: 04/08/21  5:30 PM   Specimen: Nasopharyngeal Swab; Nasopharyngeal(NP) swabs in vial transport medium  Result Value Ref Range  Status   SARS Coronavirus 2 by RT PCR NEGATIVE NEGATIVE Final    Comment: (NOTE) SARS-CoV-2 target nucleic acids are NOT DETECTED.  The SARS-CoV-2 RNA is generally detectable in upper respiratory specimens during the acute phase of infection. The lowest concentration of SARS-CoV-2 viral copies this assay can detect is 138 copies/mL. A negative result does not preclude SARS-Cov-2 infection and should not be used as the sole basis for treatment or other patient management decisions. A negative result may occur with  improper specimen collection/handling, submission of specimen other than nasopharyngeal swab, presence of viral mutation(s) within the areas targeted by this assay, and inadequate number of viral copies(<138 copies/mL). A negative result must be combined with clinical observations, patient history, and epidemiological information. The expected result is Negative.  Fact Sheet for Patients:  EntrepreneurPulse.com.au  Fact Sheet for Healthcare Providers:  IncredibleEmployment.be  This test is no t yet approved or cleared by the [place] FDA and  has been authorized for detection and/or diagnosis of SARS-CoV-2 by FDA under an Emergency Use Authorization (EUA). This EUA will remain  in effect (meaning this test can be used) for the duration of the COVID-19 declaration under Section 564(b)(1) of the Act, 21 U.S.C.section 360bbb-3(b)(1), unless the authorization is terminated  or revoked sooner.       Influenza A by PCR NEGATIVE NEGATIVE Final   Influenza B by PCR NEGATIVE NEGATIVE Final    Comment: (NOTE) The Xpert Xpress SARS-CoV-2/FLU/RSV plus assay is intended as an aid in the diagnosis of influenza from Nasopharyngeal swab specimens and should not be used as a sole basis for treatment. Nasal washings and aspirates are unacceptable for Xpert Xpress SARS-CoV-2/FLU/RSV testing.  Fact Sheet for  Patients: EntrepreneurPulse.com.au  Fact Sheet for Healthcare Providers: IncredibleEmployment.be  This test is not yet approved or  cleared by the [place] and has been authorized for detection and/or diagnosis of SARS-CoV-2 by FDA under an Emergency Use Authorization (EUA). This EUA will remain in effect (meaning this test can be used) for the duration of the COVID-19 declaration under Section 564(b)(1) of the Act, 21 U.S.C. section 360bbb-3(b)(1), unless the authorization is terminated or revoked.  Performed at [facility], [address] [place], [place] [address]     RADIOLOGY:  CT HEAD WO CONTRAST (5MM)  Result Date: 04/08/2021 CLINICAL DATA:  Right side weakness, on Eliquis. EXAM: CT HEAD WITHOUT CONTRAST TECHNIQUE: Contiguous axial images were obtained from the base of the skull through the vertex without intravenous contrast. COMPARISON:  03/03/2017 FINDINGS: Brain: Old infarcts with encephalomalacia noted in the left frontal and parietal lobes. There is atrophy and chronic small vessel disease changes. No acute intracranial abnormality. Specifically, no hemorrhage, hydrocephalus, mass lesion, acute infarction, or significant intracranial injury. Vascular: No hyperdense vessel or unexpected calcification. Skull: No acute calvarial abnormality. Sinuses/Orbits: No acute findings Other: None IMPRESSION: Old left MCA infarcts with encephalomalacia. Atrophy, chronic microvascular disease. No acute intracranial abnormality. Electronically Signed   By: Rolm Baptise M.D.   On: 04/08/2021 18:17   CT Chest W Contrast  Result Date: 04/08/2021 CLINICAL DATA:  Evaluate right hilar mass seen on chest x-ray EXAM: CT CHEST WITH CONTRAST TECHNIQUE: Multidetector CT imaging of the chest was performed during intravenous contrast administration. CONTRAST:  72mL OMNIPAQUE IOHEXOL 350 MG/ML SOLN COMPARISON:  Chest x-ray today FINDINGS:  Cardiovascular: Coronary artery and aortic calcifications. Heart is normal size. Aorta is normal caliber. Mediastinum/Nodes: Borderline sized mediastinal lymph nodes. Right paratracheal lymph node has a short axis diameter of 10 mm. No hilar or axillary adenopathy. Lungs/Pleura: Centrilobular and paraseptal emphysema. Anterior right upper lobe mass measures 3.7 x 2.6 cm on image 63 of series 3, corresponding to the density seen on chest x-ray. Appearance is concerning for bronchogenic carcinoma. No effusions. Left lung clear. Upper Abdomen: Imaging into the upper abdomen demonstrates no acute findings. Musculoskeletal: Chest wall soft tissues are unremarkable. No acute bony abnormality. IMPRESSION: Inferior anterior right upper lobe mass measuring up to 3.6 cm concerning for primary lung cancer. Borderline sized mediastinal lymph nodes. Coronary artery disease. Aortic Atherosclerosis (ICD10-I70.0) and Emphysema (ICD10-J43.9). Electronically Signed   By: Rolm Baptise M.D.   On: 04/08/2021 19:44   MR BRAIN WO CONTRAST  Result Date: 04/09/2021 CLINICAL DATA:  Acute neuro deficit EXAM: MRI HEAD WITHOUT CONTRAST TECHNIQUE: Multiplanar, multiecho pulse sequences of the brain and surrounding structures were obtained without intravenous contrast. COMPARISON:  MRI head 03/13/2017 FINDINGS: Brain: Chronic infarct left frontal parietal lobe with small amount of chronic blood products. This has progressed in size since 2018. Chronic microvascular ischemic change in the white matter. Negative for acute infarct or mass.  Ventricle size normal. Vascular: Normal arterial flow voids Skull and upper cervical spine: No focal skeletal lesion Sinuses/Orbits: Negative Other: None IMPRESSION: No acute abnormality Chronic left MCA infarct left frontal parietal lobe has progressed since 2018. Chronic microvascular ischemic change in the white matter similar to the prior study. No acute infarct. Electronically Signed   By: Franchot Gallo  M.D.   On: 04/09/2021 08:03   US Venous Img Lower Unilateral Right (DVT)  Result Date: 04/08/2021 CLINICAL DATA:  Right lower quadrant pain for 1 month. Pain, edema, tobacco use, varicose veins, anticoagulation with Eliquis. EXAM: Right LOWER EXTREMITY VENOUS DOPPLER ULTRASOUND TECHNIQUE: Gray-scale sonography with compression, as well as  color and duplex ultrasound, were performed to evaluate the deep venous system(s) from the level of the common femoral vein through the popliteal and proximal calf veins. COMPARISON:  None. FINDINGS: VENOUS Normal compressibility of the common femoral, superficial femoral, and popliteal veins, as well as the visualized calf veins. Visualized portions of profunda femoral vein and great saphenous vein unremarkable. No filling defects to suggest DVT on grayscale or color Doppler imaging. Doppler waveforms show normal direction of venous flow, normal respiratory plasticity and response to augmentation. Limited views of the contralateral common femoral vein are unremarkable. OTHER None. Limitations: none IMPRESSION: No evidence of acute deep venous thrombosis in the visualized veins of the lower extremity. Electronically Signed   By: Lucienne Capers M.D.   On: 04/08/2021 21:45   DG Chest Port 1 View  Result Date: 04/08/2021 CLINICAL DATA:  Increased weakness, history of stroke EXAM: PORTABLE CHEST 1 VIEW COMPARISON:  06/03/2015 FINDINGS: Single frontal view of the chest demonstrates an unremarkable cardiac silhouette. There is a 3.4 x 3.7 cm right hilar mass, concerning for underlying neoplasm. Chest CT is recommended for further evaluation. No airspace disease, effusion, or pneumothorax. No acute bony abnormalities. IMPRESSION: 1. 3.7 cm right hilar mass. CT chest with IV contrast is recommended for further evaluation. 2. No acute airspace disease. Electronically Signed   By: Randa Ngo M.D.   On: 04/08/2021 18:26     CODE STATUS:     Code Status Orders  (From  admission, onward)           Start     Ordered   04/08/21 2026  Full code  Continuous        04/08/21 2026           Code Status History     Date Active Date Inactive Code Status Order ID Comments User Context   03/03/2017 1558 03/08/2017 1942 Full Code [id number]  Truett Mainland, DO ED   07/25/2016 1525 08/03/2016 1535 Full Code [id number]  Cathlyn Parsons, PA-C Inpatient   07/25/2016 1525 07/25/2016 1525 Full Code [id number]  Cathlyn Parsons, PA-C Inpatient   07/20/2016 0405 07/25/2016 1512 Full Code [id number]  Kerney Elbe, MD Inpatient   06/03/2015 2210 06/05/2015 2023 Full Code [id number]  Gaynelle Arabian, MD Inpatient   06/03/2015 0345 06/03/2015 2210 Full Code [id number]  Ivor Costa, MD Inpatient        TOTAL TIME TAKING CARE OF THIS PATIENT: 40 minutes.    Fritzi Mandes M.D  [facility]    CC: Primary care physician; Baxter Hire, MD

## 2021-04-12 NOTE — Progress Notes (Signed)
Follow up phone call made to patient on Mon 04/12/21 to review outpatient follow up appts after discharging from the hospital. Reviewed appt for PET scan on 04/22/21 at follow up appt with Dr. Grayland Ormond on 04/27/21. Introduce to navigator services. Contact info given and instructed to call with any questions or needs. Pt and her husband verbalized understanding.

## 2021-04-22 ENCOUNTER — Ambulatory Visit
Admit: 2021-04-22 | Discharge: 2021-04-22 | Disposition: A | Discharge status: Home / Self Care | Payer: Medicare Other | Attending: Oncology | Admitting: Oncology

## 2021-04-22 ENCOUNTER — Other Ambulatory Visit: Payer: Self-pay

## 2021-04-22 DIAGNOSIS — M4856XA Collapsed vertebra, not elsewhere classified, lumbar region, initial encounter for fracture: Secondary | ICD-10-CM | POA: Diagnosis not present

## 2021-04-22 DIAGNOSIS — I700 Atherosclerosis of aorta: Secondary | ICD-10-CM | POA: Diagnosis not present

## 2021-04-22 DIAGNOSIS — R918 Other nonspecific abnormal finding of lung field: Secondary | ICD-10-CM | POA: Insufficient documentation

## 2021-04-22 DIAGNOSIS — J439 Emphysema, unspecified: Secondary | ICD-10-CM | POA: Diagnosis not present

## 2021-04-22 DIAGNOSIS — I2510 Atherosclerotic heart disease of native coronary artery without angina pectoris: Secondary | ICD-10-CM | POA: Diagnosis not present

## 2021-04-22 LAB — GLUCOSE, CAPILLARY: Glucose-Capillary: 90 mg/dL (ref 70–99)

## 2021-04-22 MED ORDER — FLUDEOXYGLUCOSE F - 18 (FDG) INJECTION
8.1200 | Freq: Once | INTRAVENOUS | Status: AC | PRN
  Administered 2021-04-22: 8.12 via INTRAVENOUS

## 2021-04-24 NOTE — Progress Notes (Signed)
[place]  [phone]) [phone] [phone]) [phone]  ID: Andee Poles OB: [birth year]  MR#: [id number]  JKK#:[id number]  Patient Care Team: Baxter Hire, MD as PCP - General (Internal Medicine) Golden Circle, FNP (Family Medicine) Telford Nab, RN as Oncology Nurse Navigator  CHIEF COMPLAINT: Right upper lobe lung mass  INTERVAL HISTORY: Patient is a 75 year old female who was recently found to have a right upper lobe PET positive lung mass on imaging.  She initially presented with increasing shortness of breath, weakness and fatigue but this is thought to be related to her chronic A. fib.  She also has a history of CVA.  She has no new neurologic complaints.  She denies any recent fevers or illnesses.  She has a fair appetite, but denies weight loss.  She has no current chest pain, shortness of breath, cough, or hemoptysis.  She denies any nausea, vomiting, constipation, or diarrhea.  She has no urinary complaints.  Patient feels approximately her baseline offers no further specific complaints today.  REVIEW OF SYSTEMS:   Review of Systems  Constitutional: Negative.  Negative for fever, malaise/fatigue and weight loss.  Respiratory: Negative.  Negative for cough, hemoptysis and shortness of breath.   Cardiovascular: Negative.  Negative for chest pain and leg swelling.  Gastrointestinal: Negative.  Negative for abdominal pain.  Genitourinary: Negative.  Negative for dysuria.  Musculoskeletal: Negative.  Negative for back pain.  Skin: Negative.  Negative for rash.  Neurological:  Positive for speech change. Negative for dizziness, focal weakness, weakness and headaches.  Psychiatric/Behavioral: Negative.  The patient is not nervous/anxious.    As per HPI. Otherwise, a complete review of systems is negative.  PAST MEDICAL HISTORY: Past Medical History:  Diagnosis Date   Hyperlipidemia    Hypertension    Lupus ([place])    Sciatica of right side     Stroke ([place])    Tobacco abuse     PAST SURGICAL HISTORY: Past Surgical History:  Procedure Laterality Date   REVISION TOTAL HIP ARTHROPLASTY Right    TOTAL HIP ARTHROPLASTY Right 06/03/2015   Procedure: RIGHT TOTAL HIP ARTHROPLASTY ANTERIOR APPROACH;  Surgeon: Gaynelle Arabian, MD;  Location: [facility];  Service: Orthopedics;  Laterality: Right;   TUBAL LIGATION      FAMILY HISTORY: Family History  Problem Relation Age of Onset   Breast cancer Mother    Diabetes Mother    Heart attack Father    Fibromyalgia Sister     ADVANCED DIRECTIVES (Y/N):  N  HEALTH MAINTENANCE: Social History   Tobacco Use   Smoking status: Every Day    Packs/day: 1.00    Types: Cigarettes   Smokeless tobacco: Never  Substance Use Topics   Alcohol use: Yes    Comment: Once in a while.    Drug use: Never     Colonoscopy:  PAP:  Bone density:  Lipid panel:  Allergies  Allergen Reactions   Floxin [Ofloxacin] Other (See Comments)    Scared/shaky/pain attacks/confusion   Levaquin [Levofloxacin In D5w]     Current Outpatient Medications  Medication Sig Dispense Refill   acetaminophen (TYLENOL) 325 MG tablet Take 650 mg by mouth every 6 (six) hours as needed (for headache.).     apixaban (ELIQUIS) 5 MG TABS tablet Take 1 tablet (5 mg total) by mouth 2 (two) times daily. 60 tablet 1   atorvastatin (LIPITOR) 40 MG tablet Take 1 tablet (40 mg total) by mouth daily at 6 PM.  cholecalciferol (VITAMIN D) 25 MCG tablet Take 1 tablet (1,000 Units total) by mouth daily. 30 tablet 3   diltiazem (CARDIZEM CD) 120 MG 24 hr capsule Take 1 capsule (120 mg total) by mouth daily. 30 capsule 1   Ferrous Sulfate (IRON) 325 (65 Fe) MG TABS Take 1 tablet (325 mg total) by mouth daily. 30 each 1   HYDROcodone-acetaminophen (NORCO/VICODIN) 5-325 MG tablet Take 1 tablet by mouth every 8 (eight) hours as needed for moderate pain. 15 tablet 0   hydrOXYzine (ATARAX/VISTARIL) 25 MG tablet Take 25 mg by mouth 3  (three) times daily as needed.     methocarbamol (ROBAXIN) 500 MG tablet Take 1 tablet (500 mg total) by mouth every 6 (six) hours as needed for muscle spasms. 60 tablet 0   Multiple Vitamins-Minerals (ALIVE WOMENS 50+ PO) Take 1 tablet by mouth daily.     aspirin 325 MG tablet Take 1 tablet (325 mg total) by mouth daily. (Patient not taking: Reported on 04/27/2021)     nicotine (NICODERM CQ - DOSED IN MG/24 HOURS) 14 mg/24hr patch Place 1 patch (14 mg total) onto the skin daily. (Patient not taking: Reported on 04/27/2021) 28 patch 0   No current facility-administered medications for this visit.    OBJECTIVE: Vitals:   04/27/21 1314  BP: 122/72  Pulse: (!) 51  Resp: 18  Temp: (!) 96.5 F (35.8 C)  SpO2: 99%     Body mass index is 22.78 kg/m.    ECOG FS:0 - Asymptomatic  General: Well-developed, well-nourished, no acute distress.  Sitting in a wheelchair. Eyes: Pink conjunctiva, anicteric sclera. HEENT: Normocephalic, moist mucous membranes. Lungs: No audible wheezing or coughing. Heart: Regular rate and rhythm. Abdomen: Soft, nontender, no obvious distention. Musculoskeletal: No edema, cyanosis, or clubbing. Neuro: Alert, answering all questions appropriately. Cranial nerves grossly intact. Skin: No rashes or petechiae noted. Psych: Normal affect.  LAB RESULTS:  Lab Results  Component Value Date   NA 140 04/09/2021   K 3.5 04/09/2021   CL 109 04/09/2021   CO2 26 04/09/2021   GLUCOSE 101 (H) 04/09/2021   BUN 17 04/09/2021   CREATININE 0.89 04/09/2021   CALCIUM 8.6 (L) 04/09/2021   PROT 7.4 04/08/2021   ALBUMIN 4.1 04/08/2021   AST 27 04/08/2021   ALT 16 04/08/2021   ALKPHOS 66 04/08/2021   BILITOT 1.0 04/08/2021   GFRNONAA >60 04/09/2021   GFRAA 48 (L) 04/23/2018    Lab Results  Component Value Date   WBC 4.7 04/09/2021   NEUTROABS 6.9 04/08/2021   HGB 11.7 (L) 04/09/2021   HCT 35.6 (L) 04/09/2021   MCV 100.3 (H) 04/09/2021   PLT 232 04/09/2021      STUDIES: CT HEAD WO CONTRAST (5MM)  Result Date: 04/08/2021 CLINICAL DATA:  Right side weakness, on Eliquis. EXAM: CT HEAD WITHOUT CONTRAST TECHNIQUE: Contiguous axial images were obtained from the base of the skull through the vertex without intravenous contrast. COMPARISON:  03/03/2017 FINDINGS: Brain: Old infarcts with encephalomalacia noted in the left frontal and parietal lobes. There is atrophy and chronic small vessel disease changes. No acute intracranial abnormality. Specifically, no hemorrhage, hydrocephalus, mass lesion, acute infarction, or significant intracranial injury. Vascular: No hyperdense vessel or unexpected calcification. Skull: No acute calvarial abnormality. Sinuses/Orbits: No acute findings Other: None IMPRESSION: Old left MCA infarcts with encephalomalacia. Atrophy, chronic microvascular disease. No acute intracranial abnormality. Electronically Signed   By: Rolm Baptise M.D.   On: 04/08/2021 18:17   CT Chest W Contrast  Result Date: 04/08/2021 CLINICAL DATA:  Evaluate right hilar mass seen on chest x-ray EXAM: CT CHEST WITH CONTRAST TECHNIQUE: Multidetector CT imaging of the chest was performed during intravenous contrast administration. CONTRAST:  64mL OMNIPAQUE IOHEXOL 350 MG/ML SOLN COMPARISON:  Chest x-ray today FINDINGS: Cardiovascular: Coronary artery and aortic calcifications. Heart is normal size. Aorta is normal caliber. Mediastinum/Nodes: Borderline sized mediastinal lymph nodes. Right paratracheal lymph node has a short axis diameter of 10 mm. No hilar or axillary adenopathy. Lungs/Pleura: Centrilobular and paraseptal emphysema. Anterior right upper lobe mass measures 3.7 x 2.6 cm on image 63 of series 3, corresponding to the density seen on chest x-ray. Appearance is concerning for bronchogenic carcinoma. No effusions. Left lung clear. Upper Abdomen: Imaging into the upper abdomen demonstrates no acute findings. Musculoskeletal: Chest wall soft tissues are  unremarkable. No acute bony abnormality. IMPRESSION: Inferior anterior right upper lobe mass measuring up to 3.6 cm concerning for primary lung cancer. Borderline sized mediastinal lymph nodes. Coronary artery disease. Aortic Atherosclerosis (ICD10-I70.0) and Emphysema (ICD10-J43.9). Electronically Signed   By: Rolm Baptise M.D.   On: 04/08/2021 19:44   MR BRAIN WO CONTRAST  Result Date: 04/09/2021 CLINICAL DATA:  Acute neuro deficit EXAM: MRI HEAD WITHOUT CONTRAST TECHNIQUE: Multiplanar, multiecho pulse sequences of the brain and surrounding structures were obtained without intravenous contrast. COMPARISON:  MRI head 03/13/2017 FINDINGS: Brain: Chronic infarct left frontal parietal lobe with small amount of chronic blood products. This has progressed in size since 2018. Chronic microvascular ischemic change in the white matter. Negative for acute infarct or mass.  Ventricle size normal. Vascular: Normal arterial flow voids Skull and upper cervical spine: No focal skeletal lesion Sinuses/Orbits: Negative Other: None IMPRESSION: No acute abnormality Chronic left MCA infarct left frontal parietal lobe has progressed since 2018. Chronic microvascular ischemic change in the white matter similar to the prior study. No acute infarct. Electronically Signed   By: Franchot Gallo M.D.   On: 04/09/2021 08:03   NM PET Image Initial (PI) Skull Base To Thigh  Result Date: 04/23/2021 CLINICAL DATA:  Initial treatment strategy for lung nodule. EXAM: NUCLEAR MEDICINE PET SKULL BASE TO THIGH TECHNIQUE: 8.1 mCi F-18 FDG was injected intravenously. Full-ring PET imaging was performed from the skull base to thigh after the radiotracer. CT data was obtained and used for attenuation correction and anatomic localization. Fasting blood glucose: 90 mg/dl COMPARISON:  Chest CT 04/08/2021 FINDINGS: Mediastinal blood pool activity: SUV max 2.5 Liver activity: SUV max NA NECK: Low-grade activity along the lingual tonsillar region with  maximum SUV 5.1, symmetric and likely physiologic. Incidental CT findings: Bilateral common carotid atherosclerotic calcification. CHEST: The 3.3 by 2.7 cm mass in the anterior inferior right upper lobe on image 102 of series 3 has a maximum SUV of 15.9, compatible with malignancy. 0.9 cm right lower paratracheal lymph node on image 89 series 3, maximum SUV 2.5 which is similar to that of blood pool. No overtly hypermetabolic adenopathy is identified. Incidental CT findings: Coronary, aortic arch, and branch vessel atherosclerotic vascular disease. Centrilobular emphysema. ABDOMEN/PELVIS: No significant abnormal hypermetabolic activity in this region. Incidental CT findings: Atherosclerosis is present, including aortoiliac atherosclerotic disease. Mild scarring of the right kidney lower pole. Prominent stool throughout the colon favors constipation. SKELETON: No significant abnormal hypermetabolic activity in this region. Incidental CT findings: Bony bridging between the medial right fourth and fifth ribs with deformity of the medial right fifth rib which could be from osteochondroma or an old fracture. Right total hip prosthesis.  Cervical, thoracic, lumbar spondylosis. Chronic appearing superior endplate compression at L5 with chronic posterior bony retropulsion. Degenerative retrolisthesis at the T12-L1 and L1-2 levels. IMPRESSION: 1. The 3.3 cm right upper lobe mass has a maximum SUV of 15.9, compatible with malignancy. The 9 mm right lower paratracheal lymph node anterior to the carina has a maximum SUV of 2.5 similar to that of the background blood pool. 2. Other imaging findings of potential clinical significance: Aortic Atherosclerosis (ICD10-I70.0) and Emphysema (ICD10-J43.9). Coronary atherosclerosis. Spondylosis. Chronic appearing compression fracture at L5. Prominent stool throughout the colon favors constipation. 3. Electronically Signed   By: Van Clines M.D.   On: 04/23/2021 06:37   US Venous  Img Lower Unilateral Right (DVT)  Result Date: 04/08/2021 CLINICAL DATA:  Right lower quadrant pain for 1 month. Pain, edema, tobacco use, varicose veins, anticoagulation with Eliquis. EXAM: Right LOWER EXTREMITY VENOUS DOPPLER ULTRASOUND TECHNIQUE: Gray-scale sonography with compression, as well as color and duplex ultrasound, were performed to evaluate the deep venous system(s) from the level of the common femoral vein through the popliteal and proximal calf veins. COMPARISON:  None. FINDINGS: VENOUS Normal compressibility of the common femoral, superficial femoral, and popliteal veins, as well as the visualized calf veins. Visualized portions of profunda femoral vein and great saphenous vein unremarkable. No filling defects to suggest DVT on grayscale or color Doppler imaging. Doppler waveforms show normal direction of venous flow, normal respiratory plasticity and response to augmentation. Limited views of the contralateral common femoral vein are unremarkable. OTHER None. Limitations: none IMPRESSION: No evidence of acute deep venous thrombosis in the visualized veins of the lower extremity. Electronically Signed   By: Lucienne Capers M.D.   On: 04/08/2021 21:45   DG Chest Port 1 View  Result Date: 04/08/2021 CLINICAL DATA:  Increased weakness, history of stroke EXAM: PORTABLE CHEST 1 VIEW COMPARISON:  06/03/2015 FINDINGS: Single frontal view of the chest demonstrates an unremarkable cardiac silhouette. There is a 3.4 x 3.7 cm right hilar mass, concerning for underlying neoplasm. Chest CT is recommended for further evaluation. No airspace disease, effusion, or pneumothorax. No acute bony abnormalities. IMPRESSION: 1. 3.7 cm right hilar mass. CT chest with IV contrast is recommended for further evaluation. 2. No acute airspace disease. Electronically Signed   By: Randa Ngo M.D.   On: 04/08/2021 18:26    ASSESSMENT: Right upper lobe lung mass  PLAN:   Right upper lobe lung mass: CT scan of the  chest on April 08, 2021 as well as PET scan on April 22, 2021 reviewed independently confirming FDG avid 3.7 cm right upper lobe lung mass with no other obvious evidence of disease.  MRI of the brain on April 09, 2021 did not reveal any metastatic disease either.  Patient does not appear to be a surgical candidate given her history of stroke and atrial fibrillation, but did agree to EBUS/bronchoscopy for biopsy as well as lymph node sampling.  If patient is only a stage I malignancy, she may benefit from XRT only but this will be determined after her final staging is completed.  Return to clinic 1 week after her biopsies to discuss the results and treatment planning.  I spent a total of 60 minutes reviewing chart data, face-to-face evaluation with the patient, counseling and coordination of care as detailed above.   Patient expressed understanding and was in agreement with this plan. She also understands that She can call clinic at any time with any questions, concerns, or complaints.   Cancer  Staging No matching staging information was found for the patient.  Lloyd Huger, MD   04/27/2021 3:59 PM

## 2021-04-27 ENCOUNTER — Other Ambulatory Visit: Payer: Self-pay

## 2021-04-27 ENCOUNTER — Inpatient Hospital Stay: Payer: Medicare Other | Attending: Oncology | Admitting: Oncology

## 2021-04-27 ENCOUNTER — Encounter: Payer: Self-pay | Admitting: *Deleted

## 2021-04-27 VITALS — BP 122/72 | HR 51 | Temp 96.5°F | Resp 18 | Wt 149.8 lb

## 2021-04-27 DIAGNOSIS — Z8673 Personal history of transient ischemic attack (TIA), and cerebral infarction without residual deficits: Secondary | ICD-10-CM | POA: Diagnosis not present

## 2021-04-27 DIAGNOSIS — E785 Hyperlipidemia, unspecified: Secondary | ICD-10-CM | POA: Diagnosis not present

## 2021-04-27 DIAGNOSIS — R918 Other nonspecific abnormal finding of lung field: Secondary | ICD-10-CM | POA: Diagnosis present

## 2021-04-27 DIAGNOSIS — I10 Essential (primary) hypertension: Secondary | ICD-10-CM | POA: Diagnosis not present

## 2021-04-27 DIAGNOSIS — Z7901 Long term (current) use of anticoagulants: Secondary | ICD-10-CM | POA: Insufficient documentation

## 2021-04-27 DIAGNOSIS — Z79899 Other long term (current) drug therapy: Secondary | ICD-10-CM | POA: Insufficient documentation

## 2021-04-27 DIAGNOSIS — M329 Systemic lupus erythematosus, unspecified: Secondary | ICD-10-CM | POA: Diagnosis not present

## 2021-04-27 DIAGNOSIS — I4891 Unspecified atrial fibrillation: Secondary | ICD-10-CM | POA: Insufficient documentation

## 2021-04-27 DIAGNOSIS — F17210 Nicotine dependence, cigarettes, uncomplicated: Secondary | ICD-10-CM | POA: Insufficient documentation

## 2021-04-27 NOTE — Progress Notes (Signed)
Pt very anxious about the visit and is concerned about her afib when she gets upset. Pt has decreased appetite but is supplementing with ensure. Occasional tingling to left arm d/t old injury. No other concerns at this time.

## 2021-04-28 NOTE — Progress Notes (Signed)
Met with patient during initial consult with Dr. Grayland Ormond to review PET scan results and discuss next steps. All questions answered during visit. Informed Andrea Kennedy that will fax urgent referral to Dr. Lanney Gins. Once patient scheduled for bronchoscopy will coordinate follow up appt with Dr. Grayland Ormond to discuss results and possible treatment options. Contact info given to patient. Instructed to call with any questions or needs. Andrea Kennedy verbalized understanding.

## 2021-05-10 ENCOUNTER — Other Ambulatory Visit: Admission: RE | Admit: 2021-05-10 | Payer: Medicare Other | Source: Ambulatory Visit

## 2021-05-10 ENCOUNTER — Inpatient Hospital Stay
Admission: RE | Admit: 2021-05-10 | Discharge: 2021-05-10 | Disposition: A | Discharge status: Home / Self Care | Payer: Medicare Other | Source: Ambulatory Visit

## 2021-05-11 ENCOUNTER — Other Ambulatory Visit
Admission: RE | Admit: 2021-05-11 | Discharge: 2021-05-11 | Disposition: A | Discharge status: Home / Self Care | Payer: Medicare Other | Source: Ambulatory Visit | Attending: Pulmonary Disease | Admitting: Pulmonary Disease

## 2021-05-11 ENCOUNTER — Other Ambulatory Visit: Payer: Self-pay

## 2021-05-11 DIAGNOSIS — Z01818 Encounter for other preprocedural examination: Secondary | ICD-10-CM | POA: Insufficient documentation

## 2021-05-11 DIAGNOSIS — Z20822 Contact with and (suspected) exposure to covid-19: Secondary | ICD-10-CM | POA: Insufficient documentation

## 2021-05-11 DIAGNOSIS — E119 Type 2 diabetes mellitus without complications: Secondary | ICD-10-CM | POA: Insufficient documentation

## 2021-05-11 LAB — APTT: aPTT: 31 seconds (ref 24–36)

## 2021-05-11 LAB — PROTIME-INR
INR: 1 (ref 0.8–1.2)
Prothrombin Time: 13.2 seconds (ref 11.4–15.2)

## 2021-05-11 MED ORDER — FAMOTIDINE 20 MG PO TABS
20.0000 mg | ORAL_TABLET | Freq: Once | ORAL | Status: AC
  Administered 2021-05-12: 20 mg via ORAL

## 2021-05-11 MED ORDER — ORAL CARE MOUTH RINSE: 15.0000 mL | Freq: Once | OROMUCOSAL | Status: AC

## 2021-05-11 MED ORDER — LACTATED RINGERS IV SOLN: INTRAVENOUS | Status: DC

## 2021-05-11 MED ORDER — CHLORHEXIDINE GLUCONATE 0.12 % MT SOLN
15.0000 mL | Freq: Once | OROMUCOSAL | Status: AC
  Administered 2021-05-12: 15 mL via OROMUCOSAL

## 2021-05-11 NOTE — Patient Instructions (Addendum)
Your procedure is scheduled on: 05/12/21 - Wednesday Report to the Registration Desk on the 1st floor of the [facility]. To find out your arrival time, please call [phone] between 1PM - 3PM on: 05/11/21 - Tuesday  REMEMBER: Instructions that are not followed completely may result in serious medical risk, up to and including death; or upon the discretion of your surgeon and anesthesiologist your surgery may need to be rescheduled.  Do not eat food after midnight the night before surgery.  No gum chewing, lozengers or hard candies.  You may however, drink CLEAR liquids up to 2 hours before you are scheduled to arrive for your surgery. Do not drink anything within 2 hours of your scheduled arrival time.  Clear liquids include: - water  - apple juice without pulp - gatorade (not RED, PURPLE, OR BLUE) - black coffee or tea (Do NOT add milk or creamers to the coffee or tea) Do NOT drink anything that is not on this list.  TAKE THESE MEDICATIONS THE MORNING OF SURGERY WITH A SIP OF WATER:  - diltiazem (CARDIZEM CD) 120 MG 24 hr capsule   Follow recommendations from Cardiologist, Pulmonologist or PCP regarding stopping Aspirin, Coumadin, Plavix, Eliquis, Pradaxa, or Pletal. Eliquis was stopped 05/07/21  One week prior to surgery: Stop Anti-inflammatories (NSAIDS) such as Advil, Aleve, Ibuprofen, Motrin, Naproxen, Naprosyn and Aspirin based products such as Excedrin, Goodys Powder, BC Powder.  Stop ANY OVER THE COUNTER supplements until after surgery.  You may however, continue to take Tylenol if needed for pain up until the day of surgery.  No Alcohol for 24 hours before or after surgery.  No Smoking including e-cigarettes for 24 hours prior to surgery.  No chewable tobacco products for at least 6 hours prior to surgery.  No nicotine patches on the day of surgery.  Do not use any "recreational" drugs for at least a week prior to your surgery.  Please be advised that the  combination of cocaine and anesthesia may have negative outcomes, up to and including death. If you test positive for cocaine, your surgery will be cancelled.  On the morning of surgery brush your teeth with toothpaste and water, you may rinse your mouth with mouthwash if you wish. Do not swallow any toothpaste or mouthwash.  Do not wear jewelry, make-up, hairpins, clips or nail polish.  Bathe/shower before your procedure  Do not wear lotions, powders, or perfumes.   Do not shave body from the neck down 48 hours prior to surgery just in case you cut yourself which could leave a site for infection.  Also, freshly shaved skin may become irritated if using the CHG soap.  Contact lenses, hearing aids and dentures may not be worn into surgery.  Do not bring valuables to the hospital. [facility] is not responsible for any missing/lost belongings or valuables.   Notify your doctor if there is any change in your medical condition (cold, fever, infection).  Wear comfortable clothing (specific to your surgery type) to the hospital.  After surgery, you can help prevent lung complications by doing breathing exercises.  Take deep breaths and cough every 1-2 hours. Your doctor may order a device called an Incentive Spirometer to help you take deep breaths. When coughing or sneezing, hold a pillow firmly against your incision with both hands. This is called "splinting." Doing this helps protect your incision. It also decreases belly discomfort.  If you are being admitted to the hospital overnight, leave your suitcase in the  car. After surgery it may be brought to your room.  If you are being discharged the day of surgery, you will not be allowed to drive home. You will need a responsible adult (18 years or older) to drive you home and stay with you that night.   If you are taking public transportation, you will need to have a responsible adult (18 years or older) with you. Please confirm with  your physician that it is acceptable to use public transportation.   Please call the [facility] Dept[facility] at [phone] if you have any questions about these instructions.  Surgery Visitation Policy:  Patients undergoing a surgery or procedure may have one family member or support person with them as long as that person is not COVID-19 positive or experiencing its symptoms.  That person may remain in the waiting area during the procedure and may rotate out with other people.  Inpatient Visitation:    Visiting hours are 7 a.m. to 8 p.m. Up to two visitors ages 16+ are allowed at one time in a patient room. The visitors may rotate out with other people during the day. Visitors must check out when they leave, or other visitors will not be allowed. One designated support person may remain overnight. The visitor must pass COVID-19 screenings, use hand sanitizer when entering and exiting the patient's room and wear a mask at all times, including in the patient's room. Patients must also wear a mask when staff or their visitor are in the room. Masking is required regardless of vaccination status.

## 2021-05-12 ENCOUNTER — Ambulatory Visit: Payer: Medicare Other | Admitting: Urgent Care

## 2021-05-12 ENCOUNTER — Encounter
Admission: RE | Disposition: A | Discharge status: Home / Self Care | Payer: Self-pay | Source: Home / Self Care | Attending: Pulmonary Disease

## 2021-05-12 ENCOUNTER — Ambulatory Visit
Admission: RE | Admit: 2021-05-12 | Discharge: 2021-05-12 | Disposition: A | Discharge status: Home / Self Care | Payer: Medicare Other | Attending: Pulmonary Disease | Admitting: Pulmonary Disease

## 2021-05-12 ENCOUNTER — Ambulatory Visit: Payer: Medicare Other

## 2021-05-12 ENCOUNTER — Other Ambulatory Visit: Payer: Self-pay

## 2021-05-12 ENCOUNTER — Encounter: Payer: Self-pay | Admitting: *Deleted

## 2021-05-12 DIAGNOSIS — F17210 Nicotine dependence, cigarettes, uncomplicated: Secondary | ICD-10-CM | POA: Insufficient documentation

## 2021-05-12 DIAGNOSIS — C3411 Malignant neoplasm of upper lobe, right bronchus or lung: Secondary | ICD-10-CM | POA: Diagnosis present

## 2021-05-12 DIAGNOSIS — Z8249 Family history of ischemic heart disease and other diseases of the circulatory system: Secondary | ICD-10-CM | POA: Diagnosis not present

## 2021-05-12 DIAGNOSIS — Z888 Allergy status to other drugs, medicaments and biological substances status: Secondary | ICD-10-CM | POA: Insufficient documentation

## 2021-05-12 DIAGNOSIS — Z833 Family history of diabetes mellitus: Secondary | ICD-10-CM | POA: Diagnosis not present

## 2021-05-12 DIAGNOSIS — F1411 Cocaine abuse, in remission: Secondary | ICD-10-CM | POA: Diagnosis not present

## 2021-05-12 DIAGNOSIS — I4891 Unspecified atrial fibrillation: Secondary | ICD-10-CM | POA: Insufficient documentation

## 2021-05-12 DIAGNOSIS — Z881 Allergy status to other antibiotic agents status: Secondary | ICD-10-CM | POA: Diagnosis not present

## 2021-05-12 DIAGNOSIS — Z803 Family history of malignant neoplasm of breast: Secondary | ICD-10-CM | POA: Insufficient documentation

## 2021-05-12 DIAGNOSIS — Z8673 Personal history of transient ischemic attack (TIA), and cerebral infarction without residual deficits: Secondary | ICD-10-CM | POA: Insufficient documentation

## 2021-05-12 DIAGNOSIS — Z8269 Family history of other diseases of the musculoskeletal system and connective tissue: Secondary | ICD-10-CM | POA: Diagnosis not present

## 2021-05-12 DIAGNOSIS — J432 Centrilobular emphysema: Secondary | ICD-10-CM | POA: Insufficient documentation

## 2021-05-12 HISTORY — PX: VIDEO BRONCHOSCOPY WITH ENDOBRONCHIAL ULTRASOUND: SHX6177

## 2021-05-12 HISTORY — PX: VIDEO BRONCHOSCOPY WITH ENDOBRONCHIAL NAVIGATION: SHX6175

## 2021-05-12 LAB — URINE DRUG SCREEN, QUALITATIVE (ARMC ONLY)
Amphetamines, Ur Screen: NOT DETECTED
Barbiturates, Ur Screen: NOT DETECTED
Benzodiazepine, Ur Scrn: NOT DETECTED
Cannabinoid 50 Ng, Ur ~~LOC~~: NOT DETECTED
Cocaine Metabolite,Ur ~~LOC~~: NOT DETECTED
MDMA (Ecstasy)Ur Screen: NOT DETECTED
Methadone Scn, Ur: NOT DETECTED
Opiate, Ur Screen: NOT DETECTED
Phencyclidine (PCP) Ur S: NOT DETECTED
Tricyclic, Ur Screen: POSITIVE — AB

## 2021-05-12 LAB — SARS CORONAVIRUS 2 (TAT 6-24 HRS): SARS Coronavirus 2: NEGATIVE

## 2021-05-12 SURGERY — BRONCHOSCOPY, WITH EBUS
Anesthesia: General

## 2021-05-12 MED ORDER — METOPROLOL TARTRATE 5 MG/5ML IV SOLN: 2.5000 mg | Freq: Once | INTRAVENOUS | Status: AC

## 2021-05-12 MED ORDER — METOPROLOL TARTRATE 5 MG/5ML IV SOLN
INTRAVENOUS | Status: DC | PRN
  Administered 2021-05-12 (×2): 2 mg via INTRAVENOUS

## 2021-05-12 MED ORDER — SUGAMMADEX SODIUM 200 MG/2ML IV SOLN
INTRAVENOUS | Status: DC | PRN
  Administered 2021-05-12: 450 mg via INTRAVENOUS

## 2021-05-12 MED ORDER — LACTATED RINGERS IV SOLN: INTRAVENOUS | Status: DC | PRN

## 2021-05-12 MED ORDER — FAMOTIDINE 20 MG PO TABS
ORAL_TABLET | ORAL | Status: AC
  Filled 2021-05-12: qty 1

## 2021-05-12 MED ORDER — FENTANYL CITRATE (PF) 100 MCG/2ML IJ SOLN
INTRAMUSCULAR | Status: AC
  Filled 2021-05-12: qty 2

## 2021-05-12 MED ORDER — CHLORHEXIDINE GLUCONATE 0.12 % MT SOLN
OROMUCOSAL | Status: AC
  Filled 2021-05-12: qty 15

## 2021-05-12 MED ORDER — GLYCOPYRROLATE 0.2 MG/ML IJ SOLN
INTRAMUSCULAR | Status: DC | PRN
  Administered 2021-05-12: .1 mg via INTRAVENOUS

## 2021-05-12 MED ORDER — PROPOFOL 10 MG/ML IV BOLUS
INTRAVENOUS | Status: DC | PRN
  Administered 2021-05-12: 100 mg via INTRAVENOUS

## 2021-05-12 MED ORDER — ONDANSETRON HCL 4 MG/2ML IJ SOLN: 4.0000 mg | Freq: Once | INTRAMUSCULAR | Status: DC | PRN

## 2021-05-12 MED ORDER — PHENYLEPHRINE HCL 0.25 % NA SOLN
1.0000 | Freq: Four times a day (QID) | NASAL | Status: DC | PRN
  Filled 2021-05-12: qty 15

## 2021-05-12 MED ORDER — METOPROLOL TARTRATE 5 MG/5ML IV SOLN
INTRAVENOUS | Status: AC
  Administered 2021-05-12: 2.5 mg via INTRAVENOUS
  Filled 2021-05-12: qty 5

## 2021-05-12 MED ORDER — ROCURONIUM BROMIDE 100 MG/10ML IV SOLN
INTRAVENOUS | Status: DC | PRN
  Administered 2021-05-12: 40 mg via INTRAVENOUS
  Administered 2021-05-12: 20 mg via INTRAVENOUS

## 2021-05-12 MED ORDER — BUTAMBEN-TETRACAINE-BENZOCAINE 2-2-14 % EX AERO
1.0000 | INHALATION_SPRAY | Freq: Once | CUTANEOUS | Status: DC
  Filled 2021-05-12: qty 20

## 2021-05-12 MED ORDER — FENTANYL CITRATE (PF) 100 MCG/2ML IJ SOLN: 25.0000 ug | INTRAMUSCULAR | Status: DC | PRN

## 2021-05-12 MED ORDER — DEXAMETHASONE SODIUM PHOSPHATE 10 MG/ML IJ SOLN
INTRAMUSCULAR | Status: DC | PRN
  Administered 2021-05-12: 15 mg via INTRAVENOUS

## 2021-05-12 MED ORDER — LIDOCAINE HCL (PF) 1 % IJ SOLN
30.0000 mL | Freq: Once | INTRAMUSCULAR | Status: DC
  Filled 2021-05-12: qty 30

## 2021-05-12 MED ORDER — LIDOCAINE HCL URETHRAL/MUCOSAL 2 % EX GEL
1.0000 "application " | Freq: Once | CUTANEOUS | Status: DC
  Filled 2021-05-12: qty 5

## 2021-05-12 MED ORDER — LIDOCAINE HCL (CARDIAC) PF 100 MG/5ML IV SOSY
PREFILLED_SYRINGE | INTRAVENOUS | Status: DC | PRN
Start: 2021-05-12 — End: 2021-05-12
  Administered 2021-05-12: 70 mg via INTRAVENOUS

## 2021-05-12 MED ORDER — ONDANSETRON HCL 4 MG/2ML IJ SOLN
INTRAMUSCULAR | Status: DC | PRN
  Administered 2021-05-12: 4 mg via INTRAVENOUS

## 2021-05-12 MED ORDER — FENTANYL CITRATE (PF) 100 MCG/2ML IJ SOLN
INTRAMUSCULAR | Status: DC | PRN
  Administered 2021-05-12 (×2): 50 ug via INTRAVENOUS

## 2021-05-12 NOTE — Discharge Instructions (Signed)
AMBULATORY SURGERY  ?DISCHARGE INSTRUCTIONS ? ? ?The drugs that you were given will stay in your system until tomorrow so for the next 24 hours you should not: ? ?Drive an automobile ?Make any legal decisions ?Drink any alcoholic beverage ? ? ?You may resume regular meals tomorrow.  Today it is better to start with liquids and gradually work up to solid foods. ? ?You may eat anything you prefer, but it is better to start with liquids, then soup and crackers, and gradually work up to solid foods. ? ? ?Please notify your doctor immediately if you have any unusual bleeding, trouble breathing, redness and pain at the surgery site, drainage, fever, or pain not relieved by medication. ? ? ? ?Additional Instructions: ? ? ? ?Please contact your physician with any problems or Same Day Surgery at [phone], Monday through Friday 6 am to 4 pm, or [facility] Main number at [phone].  ?

## 2021-05-12 NOTE — H&P (Signed)
Pulmonary Medicine          Date: 05/12/2021,   MRN# [id number] HIROKO TREGRE March 19, 1946     CHIEF COMPLAINT:   Lung mass of right upper lobe with hilar/mediastinal lymphadenopathy   HISTORY OF PRESENT ILLNESS   This is a very pleasant 75 yo female with hx of lifelong smoking, emphysema copd , AF CVA who came in due to abnormal CT chest findings. We reviewed imaging together with findings of RUL mass and associated thoracic lymphadenopathy suggestive of bronchogenic carcinoma. She had PET imaging following this and it showed mildly avid paratracheal LAD on right and intensily avid 2.7cm lesion of right upper lobe at SUV of 15.9.  There was no hypemetabolic activey of skeletal structures. Today she is here for bronchoscopic tissue samlping.  She has been off eliquis for appx 4d and had seen Dr Edwina Barth prior to this for clearance who manages arrythmia.  Today she is feeling well reports no new findings. We discussed procedure in detail as well as her higher risk due to previous CVA and patient accepts risk and requests to proceed with planned procedure.    PAST MEDICAL HISTORY   Past Medical History:  Diagnosis Date   Hyperlipidemia    Hypertension    Lupus ([place])    Sciatica of right side    Stroke ([place])    Tobacco abuse      SURGICAL HISTORY   Past Surgical History:  Procedure Laterality Date   REVISION TOTAL HIP ARTHROPLASTY Right    TOTAL HIP ARTHROPLASTY Right 06/03/2015   Procedure: RIGHT TOTAL HIP ARTHROPLASTY ANTERIOR APPROACH;  Surgeon: Gaynelle Arabian, MD;  Location: [facility];  Service: Orthopedics;  Laterality: Right;   TUBAL LIGATION       FAMILY HISTORY   Family History  Problem Relation Age of Onset   Breast cancer Mother    Diabetes Mother    Heart attack Father    Fibromyalgia Sister      SOCIAL HISTORY   Social History   Tobacco Use   Smoking status: Every Day    Packs/day: 1.00    Types: Cigarettes   Smokeless tobacco: Never   Substance Use Topics   Alcohol use: Yes    Comment: Once in a while.    Drug use: Never     MEDICATIONS    Home Medication:    Current Medication:  Current Facility-Administered Medications:    chlorhexidine (PERIDEX) 0.12 % solution 15 mL, 15 mL, Mouth/Throat, Once **OR** MEDLINE mouth rinse, 15 mL, Mouth Rinse, Once, Arita Miss, MD   famotidine (PEPCID) tablet 20 mg, 20 mg, Oral, Once, Karen Kitchens, NP   lactated ringers infusion, , Intravenous, Continuous, Arita Miss, MD    ALLERGIES   Floxin [ofloxacin] and Levaquin [levofloxacin in d5w]     REVIEW OF SYSTEMS    Review of Systems:  Gen:  Denies  fever, sweats, chills weigh loss  HEENT: Denies blurred vision, double vision, ear pain, eye pain, hearing loss, nose bleeds, sore throat Cardiac:  No dizziness, chest pain or heaviness, chest tightness,edema Resp:   Denies cough or sputum porduction, shortness of breath,wheezing, hemoptysis,  Gi: Denies swallowing difficulty, stomach pain, nausea or vomiting, diarrhea, constipation, bowel incontinence Gu:  Denies bladder incontinence, burning urine Ext:   Denies Joint pain, stiffness or swelling Skin: Denies  skin rash, easy bruising or bleeding or hives Endoc:  Denies polyuria, polydipsia , polyphagia or weight change Psych:   Denies depression, insomnia  or hallucinations   Other:  All other systems negative   VS: There were no vitals taken for this visit.     PHYSICAL EXAM    GENERAL:NAD, no fevers, chills, no weakness no fatigue HEAD: Normocephalic, atraumatic.  EYES: Pupils equal, round, reactive to light. Extraocular muscles intact. No scleral icterus.  MOUTH: Moist mucosal membrane. Dentition intact. No abscess noted.  EAR, NOSE, THROAT: Clear without exudates. No external lesions.  NECK: Supple. No thyromegaly. No nodules. No JVD.  PULMONARY: clear bilaterally  CARDIOVASCULAR: S1 and S2. Regular rate and rhythm. No murmurs, rubs, or gallops. No  edema. Pedal pulses 2+ bilaterally.  GASTROINTESTINAL: Soft, nontender, nondistended. No masses. Positive bowel sounds. No hepatosplenomegaly.  MUSCULOSKELETAL: No swelling, clubbing, or edema. Range of motion full in all extremities.  NEUROLOGIC: Cranial nerves II through XII are intact. No gross focal neurological deficits. Sensation intact. Reflexes intact.  SKIN: No ulceration, lesions, rashes, or cyanosis. Skin warm and dry. Turgor intact.  PSYCHIATRIC: Mood, affect within normal limits. The patient is awake, alert and oriented x 3. Insight, judgment intact.       IMAGING    NM PET Image Initial (PI) Skull Base To Thigh  Result Date: 04/23/2021 CLINICAL DATA:  Initial treatment strategy for lung nodule. EXAM: NUCLEAR MEDICINE PET SKULL BASE TO THIGH TECHNIQUE: 8.1 mCi F-18 FDG was injected intravenously. Full-ring PET imaging was performed from the skull base to thigh after the radiotracer. CT data was obtained and used for attenuation correction and anatomic localization. Fasting blood glucose: 90 mg/dl COMPARISON:  Chest CT 04/08/2021 FINDINGS: Mediastinal blood pool activity: SUV max 2.5 Liver activity: SUV max NA NECK: Low-grade activity along the lingual tonsillar region with maximum SUV 5.1, symmetric and likely physiologic. Incidental CT findings: Bilateral common carotid atherosclerotic calcification. CHEST: The 3.3 by 2.7 cm mass in the anterior inferior right upper lobe on image 102 of series 3 has a maximum SUV of 15.9, compatible with malignancy. 0.9 cm right lower paratracheal lymph node on image 89 series 3, maximum SUV 2.5 which is similar to that of blood pool. No overtly hypermetabolic adenopathy is identified. Incidental CT findings: Coronary, aortic arch, and branch vessel atherosclerotic vascular disease. Centrilobular emphysema. ABDOMEN/PELVIS: No significant abnormal hypermetabolic activity in this region. Incidental CT findings: Atherosclerosis is present, including  aortoiliac atherosclerotic disease. Mild scarring of the right kidney lower pole. Prominent stool throughout the colon favors constipation. SKELETON: No significant abnormal hypermetabolic activity in this region. Incidental CT findings: Bony bridging between the medial right fourth and fifth ribs with deformity of the medial right fifth rib which could be from osteochondroma or an old fracture. Right total hip prosthesis. Cervical, thoracic, lumbar spondylosis. Chronic appearing superior endplate compression at L5 with chronic posterior bony retropulsion. Degenerative retrolisthesis at the T12-L1 and L1-2 levels. IMPRESSION: 1. The 3.3 cm right upper lobe mass has a maximum SUV of 15.9, compatible with malignancy. The 9 mm right lower paratracheal lymph node anterior to the carina has a maximum SUV of 2.5 similar to that of the background blood pool. 2. Other imaging findings of potential clinical significance: Aortic Atherosclerosis (ICD10-I70.0) and Emphysema (ICD10-J43.9). Coronary atherosclerosis. Spondylosis. Chronic appearing compression fracture at L5. Prominent stool throughout the colon favors constipation. 3. Electronically Signed   By: Van Clines M.D.   On: 04/23/2021 06:37      ASSESSMENT/PLAN   Right lung mass with hilar lymphadenopathy -suspect lung cancer   - plan for tissue diagnsosis with bronchoscoic biopsy     -  Reviewed risks/complications and benefits with patient, risks include infection, pneumothorax/pneumomediastinum which may require chest tube placement as well as overnight/prolonged hospitalization and possible mechanical ventilation. Other risks include bleeding and very rarely death.  Patient understands risks and wishes to proceed.  Additional questions were answered, and patient is aware that post procedure patient will be going home with family and may experience cough with possible clots on expectoration as well as phlegm which may last few days as well as  hoarseness of voice post intubation and mechanical ventilation.     Thank you for allowing me to participate in the care of this patient.  Total face to face encounter time for this patient visit was >45 min. >50% of the time was  spent in counseling and coordination of care.   Patient/Family are satisfied with care plan and all questions have been answered.  This document was prepared using Dragon voice recognition software and may include unintentional dictation errors.     Ottie Glazier, M.D.  Division of [place]

## 2021-05-12 NOTE — Anesthesia Preprocedure Evaluation (Signed)
Anesthesia Evaluation  Patient identified by MRN, date of birth, ID band Patient awake    Reviewed: Allergy & Precautions, NPO status , Patient's Chart, lab work & pertinent test results  History of Anesthesia Complications Negative for: history of anesthetic complications  Airway Mallampati: II  TM Distance: >3 FB Neck ROM: Full    Dental  (+) Poor Dentition   Pulmonary neg sleep apnea, neg COPD, Current Smoker and Patient abstained from smoking.,    breath sounds clear to auscultation- rhonchi (-) wheezing      Cardiovascular hypertension, Pt. on medications (-) CAD, (-) Past MI, (-) Cardiac Stents and (-) CABG  Rhythm:Regular Rate:Normal - Systolic murmurs and - Diastolic murmurs    Neuro/Psych neg Seizures PSYCHIATRIC DISORDERS Depression CVA    GI/Hepatic negative GI ROS, Neg liver ROS,   Endo/Other  negative endocrine ROSneg diabetes  Renal/GU Renal disease     Musculoskeletal negative musculoskeletal ROS (+)   Abdominal (+) - obese,   Peds  Hematology  (+) anemia ,   Anesthesia Other Findings Past Medical History: No date: Hyperlipidemia No date: Hypertension No date: Lupus ([facility]) No date: Sciatica of right side No date: Stroke ([place]) No date: Tobacco abuse   Reproductive/Obstetrics                             Anesthesia Physical Anesthesia Plan  ASA: 3  Anesthesia Plan: General   Post-op Pain Management:    Induction: Intravenous  PONV Risk Score and Plan: 1 and Ondansetron  Airway Management Planned: Oral ETT  Additional Equipment:   Intra-op Plan:   Post-operative Plan: Extubation in OR  Informed Consent: I have reviewed the patients History and Physical, chart, labs and discussed the procedure including the risks, benefits and alternatives for the proposed anesthesia with the patient or authorized representative who has indicated his/her understanding and  acceptance.     Dental advisory given  Plan Discussed with: CRNA and Anesthesiologist  Anesthesia Plan Comments:         Anesthesia Quick Evaluation

## 2021-05-12 NOTE — Transfer of Care (Signed)
Immediate Anesthesia Transfer of Care Note  Patient: Andrea Kennedy  Procedure(s) Performed: VIDEO BRONCHOSCOPY WITH ENDOBRONCHIAL ULTRASOUND VIDEO BRONCHOSCOPY WITH ENDOBRONCHIAL NAVIGATION  Patient Location: Endoscopy Unit  Anesthesia Type:General  Level of Consciousness: awake, drowsy and patient cooperative  Airway & Oxygen Therapy: Patient Spontanous Breathing and Patient connected to face mask oxygen  Post-op Assessment: Report given to RN and Post -op Vital signs reviewed and stable  Post vital signs: Reviewed and stable  Last Vitals:  Vitals Value Taken Time  BP 118/77 05/12/21 1444  Temp    Pulse 54 05/12/21 1447  Resp 14 05/12/21 1447  SpO2 89 % 05/12/21 1447  Vitals shown include unvalidated device data.  Last Pain:  Vitals:   05/12/21 1129  TempSrc: Oral  PainSc: 0-No pain         Complications: No notable events documented.

## 2021-05-12 NOTE — Anesthesia Procedure Notes (Signed)
Procedure Name: Intubation Date/Time: 05/12/2021 12:53 PM Performed by: Kelton Pillar, CRNA Pre-anesthesia Checklist: Patient identified, Emergency Drugs available, Suction available and Patient being monitored Patient Re-evaluated:Patient Re-evaluated prior to induction Oxygen Delivery Method: Circle system utilized Preoxygenation: Pre-oxygenation with 100% oxygen Induction Type: IV induction Ventilation: Mask ventilation without difficulty Laryngoscope Size: McGraph and 1 Grade View: Grade I Tube type: Oral Tube size: 9.0 mm Number of attempts: 1 Airway Equipment and Method: Stylet and Oral airway Placement Confirmation: ETT inserted through vocal cords under direct vision, positive ETCO2, breath sounds checked- equal and bilateral and CO2 detector Secured at: 21 cm Tube secured with: Tape Dental Injury: Teeth and Oropharynx as per pre-operative assessment

## 2021-05-12 NOTE — Procedures (Addendum)
ELECTROMAGNETIC NAVIGATIONAL BRONCHOSCOPY PROCEDURE NOTE  FIBEROPTIC BRONCHOSCOPY WITH BRONCHOALVEOLAR LAVAGE AND ASPIRATION OF TRACHEOBRONCHIAL TREE PROCEDURE NOTE  ENDOBRONCHIAL ULTRASOUND PROCEDURE NOTE    Flexible bronchoscopy was performed  by : Lanney Gins MD  assistance by : 1)Repiratory therapist  and 2)[employer] staff and 3) Anesthesia team and 4) Flouroscopy team and 5) [employer] supporting staff   Indication for the procedure was :  Pre-procedural H&P. The following assessment was performed on the day of the procedure prior to initiating sedation History:  Chest pain n Dyspnea y Hemoptysis n Cough y Fever n Other pertinent items n  Examination Vital signs -reviewed as per nursing documentation today Cardiac    Murmurs: n  Rubs : n  Gallop: n Lungs Wheezing: n Rales : n Rhonchi :y  Other pertinent findings: SOB/hypoxemia due to chronic lung disease   Pre-procedural assessment for Procedural Sedation included: Depth of sedation: As per anesthesia team  ASA Classification:  2 Mallampati airway assessment: 3    Medication list reviewed: y  The patient's interval history was taken and revealed: no new complaints The pre- procedure physical examination revealed: No new findings Refer to prior clinic note for details.  Informed Consent: Informed consent was obtained from:  patient after explanation of procedure and risks, benefits, as well as alternative procedures available.  Explanation of level of sedation and possible transfusion was also provided.    Procedural Preparation: Time out was performed and patient was identified by name and birthdate and procedure to be performed and side for sampling, if any, was specified. Pt was intubated by anesthesia.  The patient was appropriately draped.   Fiberoptic bronchoscopy with airway inspection and BAL Procedure findings:   Bronchoscope was inserted via ETT  without difficulty.  Posterior  oropharynx, epiglottis, arytenoids, false cords and vocal cords were not visualized as these were bypassed by endotracheal tube. The distal trachea was normal in circumference and appearance without mucosal, cartilaginous or branching abnormalities.  The main carina was mildly splayed . All right and left lobar airways were visualized to the Subsegmental level.  Sub- sub segmental carinae were identified in all the distal airways.   Secretions were visible in the following airways and appeared to be clear.  The mucosa was : friable at RUL  Airways were notable for:        exophytic lesions :n       extrinsic compression in the following distributions: n.       Friable mucosa: y       Neurosurgeon /pigmentation: n     Post procedure Diagnosis:   There are numerous airways with inspissated mucus plugging which required suctioning and clearance.  Pictorial documentation of these findings are in clinical imaging section. Additionally anthracotic material was noted throughout bilaterally.      Electromagnetic Navigational Bronchoscopy Procedure Findings:  After appropriate CT-guided planning ENB scope was advanced via endotracheal tube and LG was advanced for registration.  Post appropriate planning and registration peripheral navigation was used to visualize target lesion.     SPECIMENS PERFORMED INCLUDED CYTOBRUSH X 2, SURGICAL BIOPSY X 8 AND BAL X2 ALL PERFORMED AT RIGHT UPPER LOBE ANTERIOR SEGMENT.   Post procedure diagnosis:   LUNG CANCER       Endobronchial ultrasound assisted hilar and mediastinal lymph node biopsies procedure findings: The fiberoptic bronchoscope was removed and the EBUS scope was introduced. Examination began to evaluate for pathologically enlarged lymph nodes starting on the LEFT  side progressing to the RIGHT side.  All lymph node biopsies performed with 21G needle. Lymph node biopsies were sent in cytolite for all stations.   Post procedure diagnosis:   STATION 10L WAS EVALUATED VIA ULTRASOUND AND WAS NORMAL WITH NO BIOPSY DONE, STATION 7 WAS ENLARGED 1.4CM AND HAD 3 BIOPSIES DONE, STATION 10 R WAS ENLARGED 1.2CM AND 3 BIOPSIES WERE DONE, STATION 4R WAS ENLARGED 1.3CM AND 3 BIOPSIES WERE DONE   Specimens obtained included:                     Cytology brushes : RIGHT UPPER LOBE WITH ATYPICAL CELLS PER PATHOLOGIST   Broncho-alveolar lavage site:RUL   sent for CYTOLOGY                              30 ml volume infused 15 ml volume returned with BLOODY  appearance   Fluoroscopy Used: YES ;        Pictorial documentation attached: IN CLINICAL IMAGING SECTION                    Immediate sampling complications included:NONE  Epinephrine ZERO ml was used topically  The bronchoscopy was terminated due to completion of the planned procedure and the bronchoscope was removed.   Total dosage of Lidocaine was ZERO mg Total fluoroscopy time was 1 minutes   Estimated Blood loss: 3cc.  Complications included:  NONE     Disposition: home with husband  Follow up with Dr. Lanney Gins in 5 days for result discussion.     Ottie Glazier MD  [facility] of Pulmonary & Critical Care Medicine

## 2021-05-13 ENCOUNTER — Encounter: Payer: Self-pay | Admitting: Pulmonary Disease

## 2021-05-13 ENCOUNTER — Other Ambulatory Visit: Payer: Self-pay | Admitting: Anatomic Pathology & Clinical Pathology

## 2021-05-13 LAB — CYTOLOGY - NON PAP

## 2021-05-13 LAB — SURGICAL PATHOLOGY

## 2021-05-14 NOTE — Anesthesia Postprocedure Evaluation (Signed)
Anesthesia Post Note  Patient: Andrea Kennedy  Procedure(s) Performed: VIDEO BRONCHOSCOPY WITH ENDOBRONCHIAL ULTRASOUND VIDEO BRONCHOSCOPY WITH ENDOBRONCHIAL NAVIGATION  Patient location during evaluation: PACU Anesthesia Type: General Level of consciousness: awake and alert Pain management: pain level controlled Vital Signs Assessment: post-procedure vital signs reviewed and stable Respiratory status: spontaneous breathing, nonlabored ventilation and respiratory function stable Cardiovascular status: blood pressure returned to baseline and stable Postop Assessment: no apparent nausea or vomiting Anesthetic complications: no   No notable events documented.   Last Vitals:  Vitals:   05/12/21 1533 05/12/21 1611  BP: (!) 110/58 103/87  Pulse: (!) 51 (!) 54  Resp: 18 16  Temp: (!) 36.2 C (!) 36.1 C  SpO2: 98% 96%    Last Pain:  Vitals:   05/12/21 1611  TempSrc: Temporal  PainSc: 0-No pain                 [place] Ouch

## 2021-05-17 DIAGNOSIS — C3411 Malignant neoplasm of upper lobe, right bronchus or lung: Secondary | ICD-10-CM | POA: Insufficient documentation

## 2021-05-17 NOTE — Progress Notes (Signed)
[place]  [phone] [phone]  ID: Andrea Kennedy OB: 1945-08-18  MR#: [id number]  AQT#:[id number]  Patient Care Team: Baxter Hire, MD as PCP - General (Internal Medicine) [place], [place] (Family Medicine) Telford Nab, RN as Oncology Nurse Navigator  CHIEF COMPLAINT: Stage Ib adenocarcinoma of right upper lobe lung.  INTERVAL HISTORY: Patient returns to clinic today for further evaluation, discussion of her final pathology results, and treatment planning.  She currently feels well and is at her baseline.  She has no new neurologic complaints.  She denies any recent fevers or illnesses.  She has a fair appetite, but denies weight loss.  She denies any chest pain, shortness of breath, cough, or hemoptysis.  She denies any nausea, vomiting, constipation, or diarrhea.  She has no urinary complaints.  Patient offers no specific complaints today.  REVIEW OF SYSTEMS:   Review of Systems  Constitutional: Negative.  Negative for fever, malaise/fatigue and weight loss.  Respiratory: Negative.  Negative for cough, hemoptysis and shortness of breath.   Cardiovascular: Negative.  Negative for chest pain and leg swelling.  Gastrointestinal: Negative.  Negative for abdominal pain.  Genitourinary: Negative.  Negative for dysuria.  Musculoskeletal: Negative.  Negative for back pain.  Skin: Negative.  Negative for rash.  Neurological:  Positive for speech change. Negative for dizziness, focal weakness, weakness and headaches.  Psychiatric/Behavioral: Negative.  The patient is not nervous/anxious.    As per HPI. Otherwise, a complete review of systems is negative.  PAST MEDICAL HISTORY: Past Medical History:  Diagnosis Date   Hyperlipidemia    Hypertension    Lupus ([place])    Sciatica of right side    Stroke ([place])    Tobacco abuse     PAST SURGICAL HISTORY: Past Surgical History:  Procedure Laterality Date   REVISION TOTAL HIP  ARTHROPLASTY Right    TOTAL HIP ARTHROPLASTY Right 06/03/2015   Procedure: RIGHT TOTAL HIP ARTHROPLASTY ANTERIOR APPROACH;  Surgeon: Gaynelle Arabian, MD;  Location: [facility];  Service: Orthopedics;  Laterality: Right;   TUBAL LIGATION     VIDEO BRONCHOSCOPY WITH ENDOBRONCHIAL NAVIGATION N/A 05/12/2021   Procedure: VIDEO BRONCHOSCOPY WITH ENDOBRONCHIAL NAVIGATION;  Surgeon: Ottie Glazier, MD;  Location: [facility];  Service: Thoracic;  Laterality: N/A;   VIDEO BRONCHOSCOPY WITH ENDOBRONCHIAL ULTRASOUND N/A 05/12/2021   Procedure: VIDEO BRONCHOSCOPY WITH ENDOBRONCHIAL ULTRASOUND;  Surgeon: Ottie Glazier, MD;  Location: [facility];  Service: Thoracic;  Laterality: N/A;    FAMILY HISTORY: Family History  Problem Relation Age of Onset   Breast cancer Mother    Diabetes Mother    Heart attack Father    Fibromyalgia Sister     ADVANCED DIRECTIVES (Y/N):  N  HEALTH MAINTENANCE: Social History   Tobacco Use   Smoking status: Every Day    Packs/day: 1.00    Types: Cigarettes   Smokeless tobacco: Never  Substance Use Topics   Alcohol use: Yes    Comment: Once in a while.    Drug use: Never     Colonoscopy:  PAP:  Bone density:  Lipid panel:  Allergies  Allergen Reactions   Hydroxychloroquine Other (See Comments)    "Lost vision in my eyes" patient states it took 4-5 years for her vision to return   Floxin [Ofloxacin] Other (See Comments)    Scared/shaky/pain attacks/confusion   Levaquin [Levofloxacin In D5w] Other (See Comments)    Unsure of reaction type    Current Outpatient Medications  Medication Sig Dispense  Refill   acetaminophen (TYLENOL) 325 MG tablet Take 650 mg by mouth every 6 (six) hours as needed (for headache/pain).     apixaban (ELIQUIS) 5 MG TABS tablet Take 1 tablet (5 mg total) by mouth 2 (two) times daily. 60 tablet 1   atorvastatin (LIPITOR) 40 MG tablet Take 1 tablet (40 mg total) by mouth daily at 6 PM.     B Complex-C (B-COMPLEX WITH VITAMIN C) tablet Take  1 tablet by mouth daily with breakfast.     benzonatate (TESSALON) 200 MG capsule Take by mouth.     cholecalciferol (VITAMIN D) 25 MCG tablet Take 1 tablet (1,000 Units total) by mouth daily. 30 tablet 3   diltiazem (CARDIZEM CD) 120 MG 24 hr capsule Take 1 capsule (120 mg total) by mouth daily. 30 capsule 1   docusate sodium (COLACE) 100 MG capsule Take 100-200 mg by mouth at bedtime.     famotidine (PEPCID) 20 MG tablet Take 20 mg by mouth 2 (two) times daily as needed for indigestion.     Ferrous Sulfate Dried (HM SLOW RELEASE IRON) 45 MG TBCR Take 45 mg by mouth in the morning.     HYDROcodone-acetaminophen (NORCO) 7.5-325 MG tablet Take 1 tablet by mouth every 6 (six) hours as needed (pain.).     hydrOXYzine (ATARAX/VISTARIL) 25 MG tablet Take 25 mg by mouth 3 (three) times daily as needed for anxiety.     Multiple Vitamin (MULTIVITAMIN WITH MINERALS) TABS tablet Take 1 tablet by mouth daily. Centrum Silver     nicotine (NICODERM CQ - DOSED IN MG/24 HOURS) 14 mg/24hr patch Place 1 patch (14 mg total) onto the skin daily. 28 patch 0   nortriptyline (PAMELOR) 25 MG capsule Take 25 mg by mouth at bedtime.     methocarbamol (ROBAXIN) 500 MG tablet Take 1 tablet (500 mg total) by mouth every 6 (six) hours as needed for muscle spasms. (Patient taking differently: Take 500 mg by mouth at bedtime.) 60 tablet 0   No current facility-administered medications for this visit.    OBJECTIVE: Vitals:   05/19/21 1011  BP: 123/77  Pulse: 60  Resp: 16  Temp: 97.8 F (36.6 C)  SpO2: 100%     Body mass index is 22.97 kg/m.    ECOG FS:0 - Asymptomatic  General: Well-developed, well-nourished, no acute distress.  Sitting in a wheelchair. Eyes: Pink conjunctiva, anicteric sclera. HEENT: Normocephalic, moist mucous membranes. Lungs: No audible wheezing or coughing. Heart: Regular rate and rhythm. Abdomen: Soft, nontender, no obvious distention. Musculoskeletal: No edema, cyanosis, or  clubbing. Neuro: Alert, answering all questions appropriately. Cranial nerves grossly intact. Skin: No rashes or petechiae noted. Psych: Normal affect.   LAB RESULTS:  Lab Results  Component Value Date   NA 140 04/09/2021   K 3.5 04/09/2021   CL 109 04/09/2021   CO2 26 04/09/2021   GLUCOSE 101 (H) 04/09/2021   BUN 17 04/09/2021   CREATININE 0.89 04/09/2021   CALCIUM 8.6 (L) 04/09/2021   PROT 7.4 04/08/2021   ALBUMIN 4.1 04/08/2021   AST 27 04/08/2021   ALT 16 04/08/2021   ALKPHOS 66 04/08/2021   BILITOT 1.0 04/08/2021   GFRNONAA >60 04/09/2021   GFRAA 48 (L) 04/23/2018    Lab Results  Component Value Date   WBC 4.7 04/09/2021   NEUTROABS 6.9 04/08/2021   HGB 11.7 (L) 04/09/2021   HCT 35.6 (L) 04/09/2021   MCV 100.3 (H) 04/09/2021   PLT 232 04/09/2021  STUDIES: NM PET Image Initial (PI) Skull Base To Thigh  Result Date: 04/23/2021 CLINICAL DATA:  Initial treatment strategy for lung nodule. EXAM: NUCLEAR MEDICINE PET SKULL BASE TO THIGH TECHNIQUE: 8.1 mCi F-18 FDG was injected intravenously. Full-ring PET imaging was performed from the skull base to thigh after the radiotracer. CT data was obtained and used for attenuation correction and anatomic localization. Fasting blood glucose: 90 mg/dl COMPARISON:  Chest CT 04/08/2021 FINDINGS: Mediastinal blood pool activity: SUV max 2.5 Liver activity: SUV max NA NECK: Low-grade activity along the lingual tonsillar region with maximum SUV 5.1, symmetric and likely physiologic. Incidental CT findings: Bilateral common carotid atherosclerotic calcification. CHEST: The 3.3 by 2.7 cm mass in the anterior inferior right upper lobe on image 102 of series 3 has a maximum SUV of 15.9, compatible with malignancy. 0.9 cm right lower paratracheal lymph node on image 89 series 3, maximum SUV 2.5 which is similar to that of blood pool. No overtly hypermetabolic adenopathy is identified. Incidental CT findings: Coronary, aortic arch, and branch  vessel atherosclerotic vascular disease. Centrilobular emphysema. ABDOMEN/PELVIS: No significant abnormal hypermetabolic activity in this region. Incidental CT findings: Atherosclerosis is present, including aortoiliac atherosclerotic disease. Mild scarring of the right kidney lower pole. Prominent stool throughout the colon favors constipation. SKELETON: No significant abnormal hypermetabolic activity in this region. Incidental CT findings: Bony bridging between the medial right fourth and fifth ribs with deformity of the medial right fifth rib which could be from osteochondroma or an old fracture. Right total hip prosthesis. Cervical, thoracic, lumbar spondylosis. Chronic appearing superior endplate compression at L5 with chronic posterior bony retropulsion. Degenerative retrolisthesis at the T12-L1 and L1-2 levels. IMPRESSION: 1. The 3.3 cm right upper lobe mass has a maximum SUV of 15.9, compatible with malignancy. The 9 mm right lower paratracheal lymph node anterior to the carina has a maximum SUV of 2.5 similar to that of the background blood pool. 2. Other imaging findings of potential clinical significance: Aortic Atherosclerosis (ICD10-I70.0) and Emphysema (ICD10-J43.9). Coronary atherosclerosis. Spondylosis. Chronic appearing compression fracture at L5. Prominent stool throughout the colon favors constipation. 3. Electronically Signed   By: Van Clines M.D.   On: 04/23/2021 06:37   DG C-Arm 1-60 Min-No Report  Result Date: 05/12/2021 Fluoroscopy was utilized by the requesting physician.  No radiographic interpretation.    ASSESSMENT: Stage Ib adenocarcinoma of right upper lobe lung.  PLAN:   Stage Ib adenocarcinoma of right upper lobe lung: CT scan of the chest on April 08, 2021 as well as PET scan on April 22, 2021 reviewed independently confirming FDG avid 3.7 cm right upper lobe lung mass with no other obvious evidence of disease.  MRI of the brain on April 09, 2021 did not reveal  any metastatic disease either.  Biopsy on May 12, 2021 confirmed stage of disease.  Results of suspicious lymph node at station 7 were discussed with pathology.  The cells are atypical but not definitive for malignancy, therefore will continue to consider patient a stage Ib.  She is not a surgical candidate given her history of stroke and atrial fibrillation.  Patient has consultation with radiation oncology later this morning to discuss definitive treatment with XRT only.  Given her stage of disease, she does not require concurrent chemotherapy.  Return to clinic at the end of radiation for further evaluation.  I spent a total of 30 minutes reviewing chart data, face-to-face evaluation with the patient, counseling and coordination of care as detailed above.   Patient  expressed understanding and was in agreement with this plan. She also understands that She can call clinic at any time with any questions, concerns, or complaints.   Cancer Staging Primary adenocarcinoma of upper lobe of right lung [facility]) Staging form: Lung, AJCC 8th Edition - Clinical stage from 05/19/2021: Stage IB (cT2a, cN0, cM0) - Signed by Lloyd Huger, MD on 05/19/2021  Lloyd Huger, MD   05/19/2021 3:14 PM

## 2021-05-19 ENCOUNTER — Encounter: Payer: Self-pay | Admitting: *Deleted

## 2021-05-19 ENCOUNTER — Other Ambulatory Visit: Payer: Self-pay

## 2021-05-19 ENCOUNTER — Ambulatory Visit
Admission: RE | Admit: 2021-05-19 | Discharge: 2021-05-19 | Disposition: A | Discharge status: Home / Self Care | Payer: Medicare Other | Source: Ambulatory Visit | Attending: Radiation Oncology | Admitting: Radiation Oncology

## 2021-05-19 ENCOUNTER — Inpatient Hospital Stay: Payer: Medicare Other | Attending: Oncology | Admitting: Oncology

## 2021-05-19 DIAGNOSIS — F17210 Nicotine dependence, cigarettes, uncomplicated: Secondary | ICD-10-CM | POA: Diagnosis not present

## 2021-05-19 DIAGNOSIS — Z7901 Long term (current) use of anticoagulants: Secondary | ICD-10-CM | POA: Insufficient documentation

## 2021-05-19 DIAGNOSIS — I10 Essential (primary) hypertension: Secondary | ICD-10-CM | POA: Diagnosis not present

## 2021-05-19 DIAGNOSIS — C3411 Malignant neoplasm of upper lobe, right bronchus or lung: Secondary | ICD-10-CM | POA: Insufficient documentation

## 2021-05-19 DIAGNOSIS — M329 Systemic lupus erythematosus, unspecified: Secondary | ICD-10-CM | POA: Insufficient documentation

## 2021-05-19 DIAGNOSIS — Z8673 Personal history of transient ischemic attack (TIA), and cerebral infarction without residual deficits: Secondary | ICD-10-CM | POA: Insufficient documentation

## 2021-05-19 DIAGNOSIS — E785 Hyperlipidemia, unspecified: Secondary | ICD-10-CM | POA: Insufficient documentation

## 2021-05-19 DIAGNOSIS — Z803 Family history of malignant neoplasm of breast: Secondary | ICD-10-CM | POA: Insufficient documentation

## 2021-05-19 DIAGNOSIS — Z79899 Other long term (current) drug therapy: Secondary | ICD-10-CM | POA: Insufficient documentation

## 2021-05-19 NOTE — Progress Notes (Signed)
Pt reports coughing up brown phlegm and has been prescribed tussinex perles. Pt questioning safety of medication d/t side effects. No other concerns at this time.

## 2021-05-19 NOTE — Consult Note (Signed)
NEW PATIENT EVALUATION  Name: Andrea Kennedy  MRN: [id number]  Date:   05/19/2021     DOB: 03-10-1946   This 75 y.o. female patient presents to the clinic for initial evaluation of stage Ib (T2 a N0 M0) adenocarcinoma the right upper lobe with lipidic features.  REFERRING PHYSICIAN: Baxter Hire, MD  CHIEF COMPLAINT:  Chief Complaint  Patient presents with   Lung Cancer    Initial consultation    DIAGNOSIS: The encounter diagnosis was Primary adenocarcinoma of upper lobe of right lung (Andrea Kennedy).   PREVIOUS INVESTIGATIONS:  CT scan and PET CT scan reviewed Pathology report reviewed Clinical notes reviewed  HPI: Patient is a 75 year old female status post CVA who presented with an incident of passing out on a hot day and was seen in the emergency room where a CT scan of her chest showed a right upper lobe mass.  There was  suggestive of bronchogenic carcinoma.  She has a chronic left MCA infarct in the left frontal parietal lobe which has progressed by MRI criteria since 2018.  CT scan showed questionable borderline mediastinal adenopathy.  PET scan demonstrated 3.3 cm right upper lobe hypermetabolic mass and 90.2 SUV compatible with malignancy.  There was a 9 mm right lower paratracheal lymph node which had similar SUV to her background blood pool.  She went on to have navigational bronchoscopy showing non-small cell lung cancer compatible with adenocarcinoma with predominantly lipidic pattern.  She is asymptomatic specifically [place] cough hemoptysis or chest tightness.  Has been seen by medical oncology.  A station 7 lymph node on fine-needle guided aspiration was suspicious for malignancy although not diagnostic.  She is seen today for radiation collagen opinion  PLANNED TREATMENT REGIMEN: SBRT  PAST MEDICAL HISTORY:  has a past medical history of Hyperlipidemia, Hypertension, Lupus ([place]), Sciatica of right side, Stroke ([place]), and Tobacco abuse.    PAST SURGICAL HISTORY:  Past  Surgical History:  Procedure Laterality Date   REVISION TOTAL HIP ARTHROPLASTY Right    TOTAL HIP ARTHROPLASTY Right 06/03/2015   Procedure: RIGHT TOTAL HIP ARTHROPLASTY ANTERIOR APPROACH;  Surgeon: Gaynelle Arabian, MD;  Location: [facility];  Service: Orthopedics;  Laterality: Right;   TUBAL LIGATION     VIDEO BRONCHOSCOPY WITH ENDOBRONCHIAL NAVIGATION N/A 05/12/2021   Procedure: VIDEO BRONCHOSCOPY WITH ENDOBRONCHIAL NAVIGATION;  Surgeon: Ottie Glazier, MD;  Location: [facility];  Service: Thoracic;  Laterality: N/A;   VIDEO BRONCHOSCOPY WITH ENDOBRONCHIAL ULTRASOUND N/A 05/12/2021   Procedure: VIDEO BRONCHOSCOPY WITH ENDOBRONCHIAL ULTRASOUND;  Surgeon: Ottie Glazier, MD;  Location: [facility];  Service: Thoracic;  Laterality: N/A;    FAMILY HISTORY: family history includes Breast cancer in her mother; Diabetes in her mother; Fibromyalgia in her sister; Heart attack in her father.  SOCIAL HISTORY:  reports that she has been smoking cigarettes. She has been smoking an average of 1 pack per day. She has never used smokeless tobacco. She reports current alcohol use. She reports that she does not use drugs.  ALLERGIES: Hydroxychloroquine, Floxin [ofloxacin], and Levaquin [levofloxacin in d5w]  MEDICATIONS:  Current Outpatient Medications  Medication Sig Dispense Refill   acetaminophen (TYLENOL) 325 MG tablet Take 650 mg by mouth every 6 (six) hours as needed (for headache/pain).     apixaban (ELIQUIS) 5 MG TABS tablet Take 1 tablet (5 mg total) by mouth 2 (two) times daily. 60 tablet 1   atorvastatin (LIPITOR) 40 MG tablet Take 1 tablet (40 mg total) by mouth daily at 6 PM.  B Complex-C (B-COMPLEX WITH VITAMIN C) tablet Take 1 tablet by mouth daily with breakfast.     benzonatate (TESSALON) 200 MG capsule Take by mouth.     cholecalciferol (VITAMIN D) 25 MCG tablet Take 1 tablet (1,000 Units total) by mouth daily. 30 tablet 3   diltiazem (CARDIZEM CD) 120 MG 24 hr capsule Take 1 capsule (120 mg  total) by mouth daily. 30 capsule 1   docusate sodium (COLACE) 100 MG capsule Take 100-200 mg by mouth at bedtime.     famotidine (PEPCID) 20 MG tablet Take 20 mg by mouth 2 (two) times daily as needed for indigestion.     Ferrous Sulfate Dried (HM SLOW RELEASE IRON) 45 MG TBCR Take 45 mg by mouth in the morning.     HYDROcodone-acetaminophen (NORCO) 7.5-325 MG tablet Take 1 tablet by mouth every 6 (six) hours as needed (pain.).     hydrOXYzine (ATARAX/VISTARIL) 25 MG tablet Take 25 mg by mouth 3 (three) times daily as needed for anxiety.     methocarbamol (ROBAXIN) 500 MG tablet Take 1 tablet (500 mg total) by mouth every 6 (six) hours as needed for muscle spasms. (Patient taking differently: Take 500 mg by mouth at bedtime.) 60 tablet 0   Multiple Vitamin (MULTIVITAMIN WITH MINERALS) TABS tablet Take 1 tablet by mouth daily. Centrum Silver     nicotine (NICODERM CQ - DOSED IN MG/24 HOURS) 14 mg/24hr patch Place 1 patch (14 mg total) onto the skin daily. 28 patch 0   nortriptyline (PAMELOR) 25 MG capsule Take 25 mg by mouth at bedtime.     No current facility-administered medications for this encounter.    ECOG PERFORMANCE STATUS:  0 - Asymptomatic  REVIEW OF SYSTEMS: Patient has significant neurologic deficiency secondary to CVA. Patient denies any weight loss, fatigue, weakness, fever, chills or night sweats. Patient denies any loss of vision, blurred vision. Patient denies any ringing  of the ears or hearing loss. No irregular heartbeat. Patient denies heart murmur or history of fainting. Patient denies any chest pain or pain radiating to her upper extremities. Patient denies any shortness of breath, difficulty breathing at night, cough or hemoptysis. Patient denies any swelling in the lower legs. Patient denies any nausea vomiting, vomiting of blood, or coffee ground material in the vomitus. Patient denies any stomach pain. Patient states has had normal bowel movements no significant  constipation or diarrhea. Patient denies any dysuria, hematuria or significant nocturia. Patient denies any problems walking, swelling in the joints or loss of balance. Patient denies any skin changes, loss of hair or loss of weight. Patient denies any excessive worrying or anxiety or significant depression. Patient denies any problems with insomnia. Patient denies excessive thirst, polyuria, polydipsia. Patient denies any swollen glands, patient denies easy bruising or easy bleeding. Patient denies any recent infections, allergies or URI. Patient "s visual fields have not changed significantly in recent time.   PHYSICAL EXAM: There were no vitals taken for this visit. Patient has some slight expressive aphasia she is wheelchair-bound in NAD.  Well-developed well-nourished patient in NAD. HEENT reveals PERLA, EOMI, discs not visualized.  Oral cavity is clear. No oral mucosal lesions are identified. Neck is clear without evidence of cervical or supraclavicular adenopathy. Lungs are clear to A&P. Cardiac examination is essentially unremarkable with regular rate and rhythm without murmur rub or thrill. Abdomen is benign with no organomegaly or masses noted. Motor sensory and DTR levels are equal and symmetric in the upper and lower extremities.  Cranial nerves II through XII are grossly intact. Proprioception is intact. No peripheral adenopathy or edema is identified. No motor or sensory levels are noted. Crude visual fields are within normal range. LABORATORY DATA: Pathology and cytology report reviewed    RADIOLOGY RESULTS: CT scan MRI brain and PET scan all reviewed compatible with above-stated findings   IMPRESSION: Stage Ib (T2 a N0 M0) adenocarcinoma the right upper lobe with lipidic features in 75 year old female  PLAN: At this time based on the [facility] fact that is lipidic features highly doubt there is mediastinal lymphadenopathy based on her PET CT scan and cytology from biopsy lymph nodes.  I agree  with Dr. Grayland Ormond we would treat this as a stage I disease and offer SBRT.  I would plan on delivering 60 Gray in 5 fractions using motion restriction as well as 4-dimensional treatment planning.  Risks and benefits of treatment including possible architectural distortion of the lung cough fatigue all were discussed in detail with the patient and her husband.  They both seem to comprehend my treatment plan well.  I have personally set up and ordered CT simulation for next week.  I would like to take this opportunity to thank you for allowing me to participate in the care of your patient.Noreene Filbert, MD

## 2021-05-20 ENCOUNTER — Other Ambulatory Visit: Payer: Medicare Other

## 2021-05-20 ENCOUNTER — Encounter: Payer: Self-pay | Admitting: *Deleted

## 2021-05-20 NOTE — Progress Notes (Signed)
Tumor Board Documentation  Andrea Kennedy was presented by Dr Grayland Ormond at our Tumor Board on 05/20/2021, which included representatives from medical oncology, radiation oncology, internal medicine, navigation, pathology, radiology, surgical, pharmacy, genetics, nutrition, research, palliative care, pulmonology.  Andrea Kennedy currently presents as a new patient, for [place], for new positive pathology with history of the following treatments: active survellience, surgical intervention(s).  Additionally, we reviewed previous medical and familial history, history of present illness, and recent lab results along with all available histopathologic and imaging studies. The tumor board considered available treatment options and made the following recommendations: Radiation therapy (primary modality), Immunotherapy, Additional screening (PDL 1)    The following procedures/referrals were also placed: No orders of the defined types were placed in this encounter.   Clinical Trial Status: not discussed   Staging used: AJCC Stage Group AJCC Staging: T: 2a N: 0 M: 0 Group: Stage  IB Adenocarcinoma of Right Upper Lobe Lung   National site-specific guidelines NCCN were discussed with respect to the case.  Tumor board is a meeting of clinicians from various specialty areas who evaluate and discuss patients for whom a multidisciplinary approach is being considered. Final determinations in the plan of care are those of the provider(s). The responsibility for follow up of recommendations given during tumor board is that of the provider.   Today's extended care, comprehensive team conference, Andrea Kennedy was not present for the discussion and was not examined.   Multidisciplinary Tumor Board is a multidisciplinary case peer review process.  Decisions discussed in the Multidisciplinary Tumor Board reflect the opinions of the specialists present at the conference without having examined the patient.  Ultimately, treatment  and diagnostic decisions rest with the primary provider(s) and the patient.

## 2021-05-20 NOTE — Progress Notes (Signed)
Chart reviewed for upcoming appts/treatment plan. Will follow up with pt at next clinic visit.

## 2021-05-20 NOTE — Progress Notes (Signed)
Order for PDL1 testing faxed to pathology.

## 2021-05-27 ENCOUNTER — Ambulatory Visit
Admission: RE | Admit: 2021-05-27 | Discharge: 2021-05-27 | Disposition: A | Discharge status: Home / Self Care | Payer: Medicare Other | Source: Ambulatory Visit | Attending: Radiation Oncology | Admitting: Radiation Oncology

## 2021-05-27 ENCOUNTER — Encounter: Payer: Self-pay | Admitting: *Deleted

## 2021-05-27 DIAGNOSIS — Z510 Encounter for antineoplastic radiation therapy: Secondary | ICD-10-CM | POA: Insufficient documentation

## 2021-05-27 DIAGNOSIS — C3411 Malignant neoplasm of upper lobe, right bronchus or lung: Secondary | ICD-10-CM | POA: Diagnosis present

## 2021-06-01 ENCOUNTER — Encounter: Payer: Self-pay | Admitting: Oncology

## 2021-06-03 DIAGNOSIS — Z510 Encounter for antineoplastic radiation therapy: Secondary | ICD-10-CM | POA: Diagnosis not present

## 2021-06-09 ENCOUNTER — Encounter: Payer: Self-pay | Admitting: *Deleted

## 2021-06-09 ENCOUNTER — Telehealth: Payer: Self-pay | Admitting: Emergency Medicine

## 2021-06-09 ENCOUNTER — Ambulatory Visit
Admission: RE | Admit: 2021-06-09 | Discharge: 2021-06-09 | Disposition: A | Discharge status: Home / Self Care | Payer: Medicare Other | Source: Ambulatory Visit | Attending: Radiation Oncology | Admitting: Radiation Oncology

## 2021-06-09 DIAGNOSIS — Z510 Encounter for antineoplastic radiation therapy: Secondary | ICD-10-CM | POA: Diagnosis not present

## 2021-06-09 NOTE — Telephone Encounter (Signed)
Opened in error

## 2021-06-10 ENCOUNTER — Telehealth: Payer: Self-pay

## 2021-06-10 NOTE — Telephone Encounter (Signed)
error 

## 2021-06-11 ENCOUNTER — Ambulatory Visit
Admission: RE | Admit: 2021-06-11 | Discharge: 2021-06-11 | Disposition: A | Discharge status: Home / Self Care | Payer: Medicare Other | Source: Ambulatory Visit | Attending: Radiation Oncology | Admitting: Radiation Oncology

## 2021-06-11 DIAGNOSIS — Z510 Encounter for antineoplastic radiation therapy: Secondary | ICD-10-CM | POA: Diagnosis not present

## 2021-06-15 ENCOUNTER — Ambulatory Visit
Admission: RE | Admit: 2021-06-15 | Discharge: 2021-06-15 | Disposition: A | Discharge status: Home / Self Care | Payer: Medicare Other | Source: Ambulatory Visit | Attending: Radiation Oncology | Admitting: Radiation Oncology

## 2021-06-15 DIAGNOSIS — C3411 Malignant neoplasm of upper lobe, right bronchus or lung: Secondary | ICD-10-CM | POA: Diagnosis present

## 2021-06-15 DIAGNOSIS — Z510 Encounter for antineoplastic radiation therapy: Secondary | ICD-10-CM | POA: Diagnosis present

## 2021-06-16 ENCOUNTER — Ambulatory Visit: Payer: Medicare Other

## 2021-06-17 ENCOUNTER — Ambulatory Visit
Admission: RE | Admit: 2021-06-17 | Discharge: 2021-06-17 | Disposition: A | Discharge status: Home / Self Care | Payer: Medicare Other | Source: Ambulatory Visit | Attending: Radiation Oncology | Admitting: Radiation Oncology

## 2021-06-17 DIAGNOSIS — Z510 Encounter for antineoplastic radiation therapy: Secondary | ICD-10-CM | POA: Diagnosis not present

## 2021-06-17 NOTE — Progress Notes (Signed)
[place]  [phone] Fax:(336) [phone]  ID: Andrea Kennedy OB: 1946-01-15  MR#: [id number]  TDV#:[id number]  Patient Care Team: Baxter Hire, MD as PCP - General (Internal Medicine) [place] [place] (Family Medicine) Telford Nab, RN as Oncology Nurse Navigator  CHIEF COMPLAINT: Stage Ib adenocarcinoma of right upper lobe lung.  INTERVAL HISTORY: Patient returns to clinic today at the conclusion of her XRT for further evaluation and diagnostic planning.  She noted increased weakness and fatigue throughout her treatments, but otherwise tolerated them well.  She has no new neurologic complaints.  She denies any recent fevers or illnesses.  She has a fair appetite, but denies weight loss.  She denies any chest pain, shortness of breath, cough, or hemoptysis.  She denies any nausea, vomiting, constipation, or diarrhea.  She has no urinary complaints.  Patient offers no further specific complaints today.  REVIEW OF SYSTEMS:   Review of Systems  Constitutional:  Positive for malaise/fatigue. Negative for fever and weight loss.  Respiratory: Negative.  Negative for cough, hemoptysis and shortness of breath.   Cardiovascular: Negative.  Negative for chest pain and leg swelling.  Gastrointestinal: Negative.  Negative for abdominal pain.  Genitourinary: Negative.  Negative for dysuria.  Musculoskeletal: Negative.  Negative for back pain.  Skin: Negative.  Negative for rash.  Neurological:  Positive for speech change and weakness. Negative for dizziness, focal weakness and headaches.  Psychiatric/Behavioral: Negative.  The patient is not nervous/anxious.    As per HPI. Otherwise, a complete review of systems is negative.  PAST MEDICAL HISTORY: Past Medical History:  Diagnosis Date   Hyperlipidemia    Hypertension    Lupus ([place])    Sciatica of right side    Stroke ([place])    Tobacco abuse     PAST SURGICAL HISTORY: Past Surgical History:   Procedure Laterality Date   REVISION TOTAL HIP ARTHROPLASTY Right    TOTAL HIP ARTHROPLASTY Right 06/03/2015   Procedure: RIGHT TOTAL HIP ARTHROPLASTY ANTERIOR APPROACH;  Surgeon: Gaynelle Arabian, MD;  Location: [facility];  Service: Orthopedics;  Laterality: Right;   TUBAL LIGATION     VIDEO BRONCHOSCOPY WITH ENDOBRONCHIAL NAVIGATION N/A 05/12/2021   Procedure: VIDEO BRONCHOSCOPY WITH ENDOBRONCHIAL NAVIGATION;  Surgeon: Ottie Glazier, MD;  Location: [facility] ORS;  Service: Thoracic;  Laterality: N/A;   VIDEO BRONCHOSCOPY WITH ENDOBRONCHIAL ULTRASOUND N/A 05/12/2021   Procedure: VIDEO BRONCHOSCOPY WITH ENDOBRONCHIAL ULTRASOUND;  Surgeon: Ottie Glazier, MD;  Location: [facility] ORS;  Service: Thoracic;  Laterality: N/A;    FAMILY HISTORY: Family History  Problem Relation Age of Onset   Breast cancer Mother    Diabetes Mother    Heart attack Father    Fibromyalgia Sister     ADVANCED DIRECTIVES (Y/N):  N  HEALTH MAINTENANCE: Social History   Tobacco Use   Smoking status: Every Day    Packs/day: 1.00    Types: Cigarettes   Smokeless tobacco: Never  Substance Use Topics   Alcohol use: Yes    Comment: Once in a while.    Drug use: Never     Colonoscopy:  PAP:  Bone density:  Lipid panel:  Allergies  Allergen Reactions   Hydroxychloroquine Other (See Comments)    "Lost vision in my eyes" patient states it took 4-5 years for her vision to return   Floxin [Ofloxacin] Other (See Comments)    Scared/shaky/pain attacks/confusion   Levaquin [Levofloxacin In D5w] Other (See Comments)    Unsure of reaction type  Current Outpatient Medications  Medication Sig Dispense Refill   acetaminophen (TYLENOL) 325 MG tablet Take 650 mg by mouth every 6 (six) hours as needed (for headache/pain).     apixaban (ELIQUIS) 5 MG TABS tablet Take 1 tablet (5 mg total) by mouth 2 (two) times daily. 60 tablet 1   atorvastatin (LIPITOR) 40 MG tablet Take 1 tablet (40 mg total) by mouth daily at 6 PM.      B Complex-C (B-COMPLEX WITH VITAMIN C) tablet Take 1 tablet by mouth daily with breakfast.     cholecalciferol (VITAMIN D) 25 MCG tablet Take 1 tablet (1,000 Units total) by mouth daily. 30 tablet 3   diltiazem (CARDIZEM CD) 120 MG 24 hr capsule Take 1 capsule (120 mg total) by mouth daily. 30 capsule 1   docusate sodium (COLACE) 100 MG capsule Take 100-200 mg by mouth at bedtime.     famotidine (PEPCID) 20 MG tablet Take 20 mg by mouth 2 (two) times daily as needed for indigestion.     Ferrous Sulfate Dried (HM SLOW RELEASE IRON) 45 MG TBCR Take 45 mg by mouth in the morning.     Multiple Vitamin (MULTIVITAMIN WITH MINERALS) TABS tablet Take 1 tablet by mouth daily. Centrum Silver     nicotine (NICODERM CQ - DOSED IN MG/24 HOURS) 14 mg/24hr patch Place 1 patch (14 mg total) onto the skin daily. 28 patch 0   nortriptyline (PAMELOR) 25 MG capsule Take 25 mg by mouth at bedtime.     HYDROcodone-acetaminophen (NORCO) 7.5-325 MG tablet Take 1 tablet by mouth every 6 (six) hours as needed (pain.). (Patient not taking: Reported on 06/23/2021)     hydrOXYzine (ATARAX/VISTARIL) 25 MG tablet Take 25 mg by mouth 3 (three) times daily as needed for anxiety. (Patient not taking: Reported on 06/23/2021)     methocarbamol (ROBAXIN) 500 MG tablet Take 1 tablet (500 mg total) by mouth every 6 (six) hours as needed for muscle spasms. (Patient not taking: Reported on 06/23/2021) 60 tablet 0   No current facility-administered medications for this visit.    OBJECTIVE: Vitals:   06/23/21 0954  BP: 134/85  Pulse: 62  Resp: 16  Temp: (!) 96.3 F (35.7 C)  SpO2: 99%     Body mass index is 22.85 kg/m.    ECOG FS:0 - Asymptomatic  General: Well-developed, well-nourished, no acute distress.  Sitting in wheelchair. Eyes: Pink conjunctiva, anicteric sclera. HEENT: Normocephalic, moist mucous membranes. Lungs: No audible wheezing or coughing. Heart: Regular rate and rhythm. Abdomen: Soft, nontender, no obvious  distention. Musculoskeletal: No edema, cyanosis, or clubbing. Neuro: Alert, answering all questions appropriately. Cranial nerves grossly intact. Skin: No rashes or petechiae noted. Psych: Normal affect.  LAB RESULTS:  Lab Results  Component Value Date   NA 140 04/09/2021   K 3.5 04/09/2021   CL 109 04/09/2021   CO2 26 04/09/2021   GLUCOSE 101 (H) 04/09/2021   BUN 17 04/09/2021   CREATININE 0.89 04/09/2021   CALCIUM 8.6 (L) 04/09/2021   PROT 7.4 04/08/2021   ALBUMIN 4.1 04/08/2021   AST 27 04/08/2021   ALT 16 04/08/2021   ALKPHOS 66 04/08/2021   BILITOT 1.0 04/08/2021   GFRNONAA >60 04/09/2021   GFRAA 48 (L) 04/23/2018    Lab Results  Component Value Date   WBC 4.7 04/09/2021   NEUTROABS 6.9 04/08/2021   HGB 11.7 (L) 04/09/2021   HCT 35.6 (L) 04/09/2021   MCV 100.3 (H) 04/09/2021   PLT 232 04/09/2021  STUDIES: No results found.  ASSESSMENT: Stage Ib adenocarcinoma of right upper lobe lung.  PLAN:   Stage Ib adenocarcinoma of right upper lobe lung: CT scan of the chest on April 08, 2021 as well as PET scan on April 22, 2021 reviewed independently confirming FDG avid 3.7 cm right upper lobe lung mass with no other obvious evidence of disease.  MRI of the brain on April 09, 2021 did not reveal any metastatic disease either.  Biopsy on May 12, 2021 confirmed diagnosis stage of disease.  Results of suspicious lymph node at station 7 were discussed with pathology.  The cells are atypical but not definitive for malignancy, therefore will continue to consider patient a stage Ib.  She was not a surgical candidate given her history of stroke and atrial fibrillation.  Patient completed XRT on June 21, 2021.  No intervention is needed at this time.  Return to clinic in 3 months with repeat PET scan and further evaluation.  If PET scan is negative, patient can likely be transitioned to CT scans and evaluation every 6 months.  I spent a total of 20 minutes reviewing  chart data, face-to-face evaluation with the patient, counseling and coordination of care as detailed above.    Patient expressed understanding and was in agreement with this plan. She also understands that She can call clinic at any time with any questions, concerns, or complaints.   Cancer Staging Primary adenocarcinoma of upper lobe of right lung [facility]) Staging form: Lung, AJCC 8th Edition - Clinical stage from 05/19/2021: Stage IB (cT2a, cN0, cM0) - Signed by Lloyd Huger, MD on 05/19/2021  Lloyd Huger, MD   06/23/2021 12:33 PM

## 2021-06-18 ENCOUNTER — Ambulatory Visit: Payer: Medicare Other

## 2021-06-21 ENCOUNTER — Ambulatory Visit
Admission: RE | Admit: 2021-06-21 | Discharge: 2021-06-21 | Disposition: A | Discharge status: Home / Self Care | Payer: Medicare Other | Source: Ambulatory Visit | Attending: Radiation Oncology | Admitting: Radiation Oncology

## 2021-06-21 DIAGNOSIS — Z510 Encounter for antineoplastic radiation therapy: Secondary | ICD-10-CM | POA: Diagnosis not present

## 2021-06-23 ENCOUNTER — Inpatient Hospital Stay (HOSPITAL_BASED_OUTPATIENT_CLINIC_OR_DEPARTMENT_OTHER): Payer: Medicare Other | Admitting: Oncology

## 2021-06-23 ENCOUNTER — Other Ambulatory Visit: Payer: Self-pay

## 2021-06-23 ENCOUNTER — Inpatient Hospital Stay: Payer: Medicare Other | Attending: Oncology

## 2021-06-23 VITALS — BP 134/85 | HR 62 | Temp 96.3°F | Resp 16 | Wt 150.3 lb

## 2021-06-23 DIAGNOSIS — C3411 Malignant neoplasm of upper lobe, right bronchus or lung: Secondary | ICD-10-CM

## 2021-06-23 DIAGNOSIS — Z79899 Other long term (current) drug therapy: Secondary | ICD-10-CM | POA: Diagnosis not present

## 2021-06-23 DIAGNOSIS — Z7901 Long term (current) use of anticoagulants: Secondary | ICD-10-CM | POA: Diagnosis not present

## 2021-06-23 DIAGNOSIS — Z923 Personal history of irradiation: Secondary | ICD-10-CM | POA: Diagnosis not present

## 2021-06-23 DIAGNOSIS — F17210 Nicotine dependence, cigarettes, uncomplicated: Secondary | ICD-10-CM | POA: Diagnosis not present

## 2021-06-23 DIAGNOSIS — I4891 Unspecified atrial fibrillation: Secondary | ICD-10-CM | POA: Diagnosis not present

## 2021-06-23 DIAGNOSIS — Z8673 Personal history of transient ischemic attack (TIA), and cerebral infarction without residual deficits: Secondary | ICD-10-CM | POA: Insufficient documentation

## 2021-06-23 NOTE — Progress Notes (Signed)
Pt states radiation made her weak, c/o back pain and nausea. Pt concerned about lupus flare up.

## 2021-07-23 ENCOUNTER — Ambulatory Visit: Payer: Medicare Other | Admitting: Radiation Oncology

## 2021-08-05 ENCOUNTER — Inpatient Hospital Stay: Payer: Medicare Other | Attending: Radiation Oncology

## 2021-08-05 ENCOUNTER — Other Ambulatory Visit: Payer: Self-pay | Admitting: *Deleted

## 2021-08-05 ENCOUNTER — Ambulatory Visit
Admission: RE | Admit: 2021-08-05 | Discharge: 2021-08-05 | Disposition: A | Discharge status: Home / Self Care | Payer: Medicare Other | Source: Ambulatory Visit | Attending: Radiation Oncology | Admitting: Radiation Oncology

## 2021-08-05 ENCOUNTER — Encounter: Payer: Self-pay | Admitting: Radiation Oncology

## 2021-08-05 ENCOUNTER — Other Ambulatory Visit: Payer: Self-pay

## 2021-08-05 VITALS — BP 121/77 | HR 70 | Temp 97.3°F | Resp 16 | Wt 152.0 lb

## 2021-08-05 DIAGNOSIS — R29818 Other symptoms and signs involving the nervous system: Secondary | ICD-10-CM | POA: Diagnosis not present

## 2021-08-05 DIAGNOSIS — C3411 Malignant neoplasm of upper lobe, right bronchus or lung: Secondary | ICD-10-CM | POA: Insufficient documentation

## 2021-08-05 DIAGNOSIS — Z923 Personal history of irradiation: Secondary | ICD-10-CM | POA: Diagnosis not present

## 2021-08-05 MED ORDER — AZITHROMYCIN 250 MG PO TABS: ORAL_TABLET | ORAL | 0 refills | Status: DC

## 2021-08-05 NOTE — Progress Notes (Signed)
Radiation Oncology Follow up Note  Name: Andrea Kennedy   Date:   08/05/2021 MRN:  [id number] DOB: [birth year]    This 75 y.o. female presents to the clinic today for 1 month follow-up status post SBRT for an adenocarcinoma the right upper lobe with lipidic features.  REFERRING PROVIDER: Baxter Hire, MD  HPI: Patient is a 75 year old female now at 1 month having completed SBRT to her right upper lobe.  For adenocarcinoma with lipidic features.  Seen today in routine follow-up she is doing fairly well.  She does have some neurologic deficits.  She specifically [place] cough hemoptysis or chest tightness.  She is having no dysphagia at this time.  She has a PET scan scheduled for February.  COMPLICATIONS OF TREATMENT: none  FOLLOW UP COMPLIANCE: keeps appointments   PHYSICAL EXAM:  BP 121/77 (BP Location: Left Arm, Patient Position: Sitting)    Pulse 70    Temp (!) 97.3 F (36.3 C) (Tympanic)    Resp 16    Wt 152 lb (68.9 kg)    BMI 23.11 kg/m  Wheelchair-bound female in NAD with obvious neurologic deficits.  Well-developed well-nourished patient in NAD. HEENT reveals PERLA, EOMI, discs not visualized.  Oral cavity is clear. No oral mucosal lesions are identified. Neck is clear without evidence of cervical or supraclavicular adenopathy. Lungs are clear to A&P. Cardiac examination is essentially unremarkable with regular rate and rhythm without murmur rub or thrill. Abdomen is benign with no organomegaly or masses noted. Motor sensory and DTR levels are equal and symmetric in the upper and lower extremities. Cranial nerves II through XII are grossly intact. Proprioception is intact. No peripheral adenopathy or edema is identified. No motor or sensory levels are noted. Crude visual fields are within normal range.  RADIOLOGY RESULTS: No current films for review  PLAN: Present time she is having extremely low side effect profile from her SBRT.  I have asked to see her back in 3 months after her  PET CT scan has been performed.  Otherwise she is doing well she continues follow-up care with medical oncology.  I would like to take this opportunity to thank you for allowing me to participate in the care of your patient.Noreene Filbert, MD

## 2021-09-20 ENCOUNTER — Ambulatory Visit
Admission: RE | Admit: 2021-09-20 | Discharge: 2021-09-20 | Disposition: A | Discharge status: Home / Self Care | Payer: Medicare Other | Source: Ambulatory Visit | Attending: Oncology | Admitting: Oncology

## 2021-09-20 ENCOUNTER — Other Ambulatory Visit: Payer: Self-pay

## 2021-09-20 DIAGNOSIS — I700 Atherosclerosis of aorta: Secondary | ICD-10-CM | POA: Insufficient documentation

## 2021-09-20 DIAGNOSIS — C3411 Malignant neoplasm of upper lobe, right bronchus or lung: Secondary | ICD-10-CM | POA: Diagnosis not present

## 2021-09-20 DIAGNOSIS — J439 Emphysema, unspecified: Secondary | ICD-10-CM | POA: Diagnosis not present

## 2021-09-20 LAB — GLUCOSE, CAPILLARY: Glucose-Capillary: 86 mg/dL (ref 70–99)

## 2021-09-20 MED ORDER — FLUDEOXYGLUCOSE F - 18 (FDG) INJECTION
7.9000 | Freq: Once | INTRAVENOUS | Status: AC | PRN
  Administered 2021-09-20: 8.21 via INTRAVENOUS

## 2021-09-23 ENCOUNTER — Encounter: Payer: Self-pay | Admitting: Oncology

## 2021-09-23 ENCOUNTER — Inpatient Hospital Stay: Payer: Medicare Other | Attending: Oncology

## 2021-09-23 ENCOUNTER — Other Ambulatory Visit: Payer: Self-pay

## 2021-09-23 ENCOUNTER — Inpatient Hospital Stay (HOSPITAL_BASED_OUTPATIENT_CLINIC_OR_DEPARTMENT_OTHER): Payer: Medicare Other | Admitting: Oncology

## 2021-09-23 VITALS — BP 108/70 | HR 65 | Temp 97.8°F | Resp 16 | Wt 155.9 lb

## 2021-09-23 DIAGNOSIS — M329 Systemic lupus erythematosus, unspecified: Secondary | ICD-10-CM | POA: Diagnosis not present

## 2021-09-23 DIAGNOSIS — C3411 Malignant neoplasm of upper lobe, right bronchus or lung: Secondary | ICD-10-CM | POA: Insufficient documentation

## 2021-09-23 DIAGNOSIS — Z79899 Other long term (current) drug therapy: Secondary | ICD-10-CM | POA: Diagnosis not present

## 2021-09-23 DIAGNOSIS — Z7901 Long term (current) use of anticoagulants: Secondary | ICD-10-CM | POA: Insufficient documentation

## 2021-09-23 DIAGNOSIS — Z923 Personal history of irradiation: Secondary | ICD-10-CM | POA: Diagnosis not present

## 2021-09-23 DIAGNOSIS — E785 Hyperlipidemia, unspecified: Secondary | ICD-10-CM | POA: Diagnosis not present

## 2021-09-23 DIAGNOSIS — I10 Essential (primary) hypertension: Secondary | ICD-10-CM | POA: Insufficient documentation

## 2021-09-23 DIAGNOSIS — I4891 Unspecified atrial fibrillation: Secondary | ICD-10-CM | POA: Diagnosis not present

## 2021-09-23 NOTE — Progress Notes (Signed)
Patient here for results she has no questions.

## 2021-09-23 NOTE — Progress Notes (Signed)
[place]  Telephone:(336) [phone] Fax:(336) [phone]  ID: BORGHILD THAKER OB: [birth year]  MR#: [id number]  UXL#:[id number]  Patient Care Team: Baxter Hire, MD as PCP - General (Internal Medicine) [place] [place] (Family Medicine) Telford Nab, RN as Oncology Nurse Navigator  CHIEF COMPLAINT: Stage Ib adenocarcinoma of right upper lobe lung.  INTERVAL HISTORY: Patient returns to clinic today for further evaluation and discussion of her imaging results.  She currently feels well and is at her baseline.  She does not complain of weakness and fatigue today.  She has no new neurologic complaints.  She denies any recent fevers or illnesses.  She has a fair appetite, but denies weight loss.  She denies any chest pain, shortness of breath, cough, or hemoptysis.  She denies any nausea, vomiting, constipation, or diarrhea.  She has no urinary complaints.  Patient offers no specific complaints today.  REVIEW OF SYSTEMS:   Review of Systems  Constitutional: Negative.  Negative for fever, malaise/fatigue and weight loss.  Respiratory: Negative.  Negative for cough, hemoptysis and shortness of breath.   Cardiovascular: Negative.  Negative for chest pain and leg swelling.  Gastrointestinal: Negative.  Negative for abdominal pain.  Genitourinary: Negative.  Negative for dysuria.  Musculoskeletal: Negative.  Negative for back pain.  Skin: Negative.  Negative for rash.  Neurological:  Positive for speech change. Negative for dizziness, focal weakness, weakness and headaches.  Psychiatric/Behavioral: Negative.  The patient is not nervous/anxious.    As per HPI. Otherwise, a complete review of systems is negative.  PAST MEDICAL HISTORY: Past Medical History:  Diagnosis Date   Hyperlipidemia    Hypertension    Lupus ([place])    Sciatica of right side    Stroke ([place])    Tobacco abuse     PAST SURGICAL HISTORY: Past Surgical History:  Procedure Laterality Date    REVISION TOTAL HIP ARTHROPLASTY Right    TOTAL HIP ARTHROPLASTY Right 06/03/2015   Procedure: RIGHT TOTAL HIP ARTHROPLASTY ANTERIOR APPROACH;  Surgeon: Gaynelle Arabian, MD;  Location: [facility];  Service: Orthopedics;  Laterality: Right;   TUBAL LIGATION     VIDEO BRONCHOSCOPY WITH ENDOBRONCHIAL NAVIGATION N/A 05/12/2021   Procedure: VIDEO BRONCHOSCOPY WITH ENDOBRONCHIAL NAVIGATION;  Surgeon: Ottie Glazier, MD;  Location: [facility] ORS;  Service: Thoracic;  Laterality: N/A;   VIDEO BRONCHOSCOPY WITH ENDOBRONCHIAL ULTRASOUND N/A 05/12/2021   Procedure: VIDEO BRONCHOSCOPY WITH ENDOBRONCHIAL ULTRASOUND;  Surgeon: Ottie Glazier, MD;  Location: [facility] ORS;  Service: Thoracic;  Laterality: N/A;    FAMILY HISTORY: Family History  Problem Relation Age of Onset   Breast cancer Mother    Diabetes Mother    Heart attack Father    Fibromyalgia Sister     ADVANCED DIRECTIVES (Y/N):  N  HEALTH MAINTENANCE: Social History   Tobacco Use   Smoking status: Some Days    Packs/day: 0.25    Types: Cigarettes   Smokeless tobacco: Never  Substance Use Topics   Alcohol use: Yes    Comment: Once in a while.    Drug use: Never     Colonoscopy:  PAP:  Bone density:  Lipid panel:  Allergies  Allergen Reactions   Hydroxychloroquine Other (See Comments)    "Lost vision in my eyes" patient states it took 4-5 years for her vision to return   Floxin [Ofloxacin] Other (See Comments)    Scared/shaky/pain attacks/confusion   Levaquin [Levofloxacin In D5w] Other (See Comments)    Unsure of reaction type  Current Outpatient Medications  Medication Sig Dispense Refill   acetaminophen (TYLENOL) 325 MG tablet Take 650 mg by mouth every 6 (six) hours as needed (for headache/pain).     apixaban (ELIQUIS) 5 MG TABS tablet Take 1 tablet (5 mg total) by mouth 2 (two) times daily. 60 tablet 1   atorvastatin (LIPITOR) 40 MG tablet Take 1 tablet (40 mg total) by mouth daily at 6 PM.     B Complex-C (B-COMPLEX WITH  VITAMIN C) tablet Take 1 tablet by mouth daily with breakfast.     cholecalciferol (VITAMIN D) 25 MCG tablet Take 1 tablet (1,000 Units total) by mouth daily. 30 tablet 3   diltiazem (CARDIZEM CD) 120 MG 24 hr capsule Take 1 capsule (120 mg total) by mouth daily. 30 capsule 1   docusate sodium (COLACE) 100 MG capsule Take 100-200 mg by mouth at bedtime.     famotidine (PEPCID) 20 MG tablet Take 20 mg by mouth 2 (two) times daily as needed for indigestion.     Ferrous Sulfate Dried (HM SLOW RELEASE IRON) 45 MG TBCR Take 45 mg by mouth in the morning.     Multiple Vitamin (MULTIVITAMIN WITH MINERALS) TABS tablet Take 1 tablet by mouth daily. Centrum Silver     nicotine (NICODERM CQ - DOSED IN MG/24 HOURS) 14 mg/24hr patch Place 1 patch (14 mg total) onto the skin daily. 28 patch 0   nortriptyline (PAMELOR) 25 MG capsule Take 25 mg by mouth at bedtime.     HYDROcodone-acetaminophen (NORCO) 7.5-325 MG tablet Take 1 tablet by mouth every 6 (six) hours as needed (pain.). (Patient not taking: Reported on 06/23/2021)     hydrOXYzine (ATARAX/VISTARIL) 25 MG tablet Take 25 mg by mouth 3 (three) times daily as needed for anxiety. (Patient not taking: Reported on 06/23/2021)     methocarbamol (ROBAXIN) 500 MG tablet Take 1 tablet (500 mg total) by mouth every 6 (six) hours as needed for muscle spasms. (Patient not taking: Reported on 06/23/2021) 60 tablet 0   No current facility-administered medications for this visit.    OBJECTIVE: Vitals:   09/23/21 1020  BP: 108/70  Pulse: 65  Resp: 16  Temp: 97.8 F (36.6 C)  SpO2: 99%     Body mass index is 23.7 kg/m.    ECOG FS:0 - Asymptomatic  General: Well-developed, well-nourished, no acute distress.  Sitting in a wheelchair. Eyes: Pink conjunctiva, anicteric sclera. HEENT: Normocephalic, moist mucous membranes. Lungs: No audible wheezing or coughing. Heart: Regular rate and rhythm. Abdomen: Soft, nontender, no obvious distention. Musculoskeletal: No  edema, cyanosis, or clubbing. Neuro: Alert, answering all questions appropriately. Cranial nerves grossly intact. Skin: No rashes or petechiae noted. Psych: Normal affect.  LAB RESULTS:  Lab Results  Component Value Date   NA 140 04/09/2021   K 3.5 04/09/2021   CL 109 04/09/2021   CO2 26 04/09/2021   GLUCOSE 101 (H) 04/09/2021   BUN 17 04/09/2021   CREATININE 0.89 04/09/2021   CALCIUM 8.6 (L) 04/09/2021   PROT 7.4 04/08/2021   ALBUMIN 4.1 04/08/2021   AST 27 04/08/2021   ALT 16 04/08/2021   ALKPHOS 66 04/08/2021   BILITOT 1.0 04/08/2021   GFRNONAA >60 04/09/2021   GFRAA 48 (L) 04/23/2018    Lab Results  Component Value Date   WBC 4.7 04/09/2021   NEUTROABS 6.9 04/08/2021   HGB 11.7 (L) 04/09/2021   HCT 35.6 (L) 04/09/2021   MCV 100.3 (H) 04/09/2021   PLT 232 04/09/2021  STUDIES: NM PET Image Restag (PS) Skull Base To Thigh  Result Date: 09/21/2021 CLINICAL DATA:  Subsequent treatment strategy for lung cancer in a 76 year old female with primary adenocarcinoma of the RIGHT upper lobe post radiotherapy. EXAM: NUCLEAR MEDICINE PET SKULL BASE TO THIGH TECHNIQUE: 8.21 mCi F-18 FDG was injected intravenously. Full-ring PET imaging was performed from the skull base to thigh after the radiotracer. CT data was obtained and used for attenuation correction and anatomic localization. Fasting blood glucose: 86 mg/dl COMPARISON:  April 22, 2021. FINDINGS: Mediastinal blood pool activity: SUV max 2.39 Liver activity: SUV max NA NECK: No hypermetabolic lymph nodes in the neck. Incidental CT findings: none CHEST: At the site of previous RIGHT upper lobe mass just above the major fissure in the anterior RIGHT upper lobe is an area of nodularity measuring approximately 17 x 7 mm. This appears crescentic in the rounded appearance of this previously identified mass has changed with crescentic in linear areas and surrounding ground-glass attenuation replacing the RIGHT upper lobe mass.  Maximum SUV of this area is 2.41. Incidental CT findings: Mild cardiomegaly. Three-vessel coronary artery calcification. Calcifications of the thoracic aorta without aneurysm. Signs of pulmonary emphysema centrilobular and paraseptal moderate and worse at the lung apices. Airways are patent. No adenopathy by size criteria in the chest. ABDOMEN/PELVIS: No abnormal hypermetabolic activity within the liver, pancreas, adrenal glands, or spleen. No hypermetabolic lymph nodes in the abdomen or pelvis. Incidental CT findings: No acute findings in the abdomen, aortic atherosclerosis without aneurysm. Aorta is moderate to markedly calcified. SKELETON: Asymmetric marrow activity seen throughout the lower lumbar spine and in the pelvis. RIGHT iliac posteriorly with increased metabolic activity relative to the LEFT with a maximum SUV of 3.4. Other areas of metabolic activity scattered throughout posterior ribs medially near costovertebral angles in the chest with relative symmetry. Also some increased metabolic activity at the RIGHT thoracic inlet without corresponding abnormality adjacent to cervical musculature (image 47/3) Incidental CT findings: Signs of RIGHT hip arthroplasty. Spinal degenerative changes. IMPRESSION: Marked improvement with respect to increased metabolic activity and now with bandlike area of uptake more compatible with post treatment changes indicative of marked response to therapy without signs of metastatic disease. Asymmetry with respect to FDG uptake about the posterior RIGHT iliac crest as compared to the LEFT with generalized heterogeneity of marrow uptake in the lumbar spine and contralateral iliac crest present on the prior study and more likely related to asymmetric marrow changes and or altered biomechanics along the sacroiliac joint in the setting of RIGHT hip arthroplasty. Attention on follow-up. Aortic Atherosclerosis (ICD10-I70.0) and Emphysema (ICD10-J43.9). Electronically Signed   By:  Zetta Bills M.D.   On: 09/21/2021 11:00    ASSESSMENT: Stage Ib adenocarcinoma of right upper lobe lung.  PLAN:   Stage Ib adenocarcinoma of right upper lobe lung: CT scan of the chest on April 08, 2021 as well as PET scan on April 22, 2021 reviewed independently confirming FDG avid 3.7 cm right upper lobe lung mass with no other obvious evidence of disease.  MRI of the brain on April 09, 2021 did not reveal any metastatic disease either.  Biopsy on May 12, 2021 confirmed diagnosis stage of disease.  Results of suspicious lymph node at station 7 were discussed with pathology.  The cells are atypical but not definitive for malignancy, therefore will continue to consider patient a stage Ib.  She was not a surgical candidate given her history of stroke and atrial fibrillation.  Patient completed XRT  on June 21, 2021.  Repeat PET scan on September 21, 2021 reviewed independently reported as above with no obvious evidence of recurrent or progressive disease.  Patient can now be transitioned to CT scans every 6 months for at least 2 years.  Return to clinic in 6 months with CT of the chest and further evaluation.   Residual weakness from history of CVA: Patient has requested referral for home physical therapy and Occupational Therapy.  I spent a total of 20 minutes reviewing chart data, face-to-face evaluation with the patient, counseling and coordination of care as detailed above.   Patient expressed understanding and was in agreement with this plan. She also understands that She can call clinic at any time with any questions, concerns, or complaints.    Cancer Staging  Primary adenocarcinoma of upper lobe of right lung [facility]) Staging form: Lung, AJCC 8th Edition - Clinical stage from 05/19/2021: Stage IB (cT2a, cN0, cM0) - Signed by Lloyd Huger, MD on 05/19/2021  Lloyd Huger, MD   09/24/2021 10:56 AM

## 2021-09-24 NOTE — Addendum Note (Signed)
Addended by: Wilford Corner on: 09/24/2021 01:40 PM   Modules accepted: Orders

## 2021-09-27 ENCOUNTER — Telehealth: Payer: Self-pay | Admitting: Emergency Medicine

## 2021-09-27 NOTE — Telephone Encounter (Signed)
Pt Home Health referral faxed to [facility] but they do not accept pt insurance. Referral faxed to [facility] and was told they could not accept her at this time d/t staffing. Faxed referral to [facility] and awaiting response.

## 2021-09-28 NOTE — Telephone Encounter (Signed)
Spoke with Corene Cornea at [facility]). Unable to see patient d/t staffing. Referral sent to [facility]. Awaiting response.

## 2021-09-29 NOTE — Telephone Encounter (Signed)
[facility] does not offering PT/OT services. Pt called and made aware of decisions from all companies that referrals were sent to. Pt verbalized understanding.

## 2021-11-03 ENCOUNTER — Other Ambulatory Visit: Payer: Self-pay | Admitting: *Deleted

## 2021-11-03 ENCOUNTER — Other Ambulatory Visit: Payer: Self-pay

## 2021-11-03 ENCOUNTER — Ambulatory Visit
Admission: RE | Admit: 2021-11-03 | Discharge: 2021-11-03 | Disposition: A | Discharge status: Home / Self Care | Payer: Medicare Other | Source: Ambulatory Visit | Attending: Radiation Oncology | Admitting: Radiation Oncology

## 2021-11-03 ENCOUNTER — Inpatient Hospital Stay: Payer: Medicare Other | Attending: Radiation Oncology

## 2021-11-03 ENCOUNTER — Encounter: Payer: Self-pay | Admitting: Radiation Oncology

## 2021-11-03 VITALS — BP 140/70 | HR 65 | Temp 97.2°F | Resp 16 | Ht 68.0 in | Wt 157.9 lb

## 2021-11-03 DIAGNOSIS — C3411 Malignant neoplasm of upper lobe, right bronchus or lung: Secondary | ICD-10-CM

## 2021-11-03 DIAGNOSIS — Z923 Personal history of irradiation: Secondary | ICD-10-CM | POA: Insufficient documentation

## 2021-11-03 MED ORDER — AZITHROMYCIN 250 MG PO TABS: ORAL_TABLET | ORAL | 3 refills | Status: DC

## 2021-11-03 NOTE — Progress Notes (Signed)
Survivorship Care Plan visit completed.  Treatment summary reviewed and given to patient.  ASCO answers booklet reviewed and given to patient.  CARE program and Cancer Transitions discussed with patient along with other resources cancer center offers to patients and caregivers.  Patient verbalized understanding.    

## 2021-11-03 NOTE — Progress Notes (Signed)
zithromax ?

## 2021-11-03 NOTE — Progress Notes (Signed)
Radiation Oncology ?Follow up Note ? ?Name: Andrea Kennedy   ?Date:   11/03/2021 ?MRN:  [id number] ?DOB: 1945/10/19  ? ? ?This 76 y.o. female presents to the clinic today for 65-month follow-up status post SBRT to her right upper lobe for adenocarcinoma with lipidic features. ? ?REFERRING PROVIDER: Baxter Hire, MD ? ?HPI: Patient is a 76 year old female now out 4 months having completed SBRT to her right upper lobe for an adenocarcinoma with lipidic features.  She is seen today in routine follow-up her only complaint is some sinus infection for which has been on a Z-Pak and is requesting another refill of that.  She specifically denies hemoptysis or chest tightness she does have a nonproductive cough.  She had a recent.  PET CT scan back in February which I have reviewed showed marked improvement respect to increased metabolic activity in the region of the right upper lobe.  She now has some bandlike area of opacity compatible with posttreatment changes from radiation. ? ?COMPLICATIONS OF TREATMENT: none ? ?FOLLOW UP COMPLIANCE: keeps appointments  ? ?PHYSICAL EXAM:  ?BP 140/70 (BP Location: Left Arm, Patient Position: Sitting)   Pulse 65   Temp (!) 97.2 ?F (36.2 ?C) (Tympanic)   Resp 16   Ht 5\' 8"  (1.727 m)   Wt 157 lb 14.4 oz (71.6 kg)   BMI 24.01 kg/m?  ?Wheelchair-bound female in NAD.  Well-developed well-nourished patient in NAD. HEENT reveals PERLA, EOMI, discs not visualized.  Oral cavity is clear. No oral mucosal lesions are identified. Neck is clear without evidence of cervical or supraclavicular adenopathy. Lungs are clear to A&P. Cardiac examination is essentially unremarkable with regular rate and rhythm without murmur rub or thrill. Abdomen is benign with no organomegaly or masses noted. Motor sensory and DTR levels are equal and symmetric in the upper and lower extremities. Cranial nerves II through XII are grossly intact. Proprioception is intact. No peripheral adenopathy or edema is  identified. No motor or sensory levels are noted. Crude visual fields are within normal range. ? ?RADIOLOGY RESULTS: PET CT scan reviewed compatible with above-stated findings ? ?PLAN: Present time patient is doing well excellent sponsor treatment per PET/CT criteria.  She continues to do well on starting her on a another Z-Pak with a couple of refills.  I have asked to see her back in 6 months for follow-up with a repeat CT at that time.  Patient knows to call with any concerns. ? ?I would like to take this opportunity to thank you for allowing me to participate in the care of your patient.. ?  ? Noreene Filbert, MD ? ?

## 2021-11-26 IMAGING — CT CT HEAD W/O CM
3 series · 16 of 47 positions shown, 19 images · non-contrast
Comparison: 03/03/2017

CLINICAL DATA: Right side weakness, on Eliquis.

EXAM:
CT HEAD WITHOUT CONTRAST
TECHNIQUE: Contiguous axial images were obtained from the base of the skull
through the vertex without intravenous contrast.

[Series 2: head bone · axial · 0.40mm/px · z∈[-123,-7]mm · 10 of 68 slices shown, 13 images]
[im 5/68  brain]
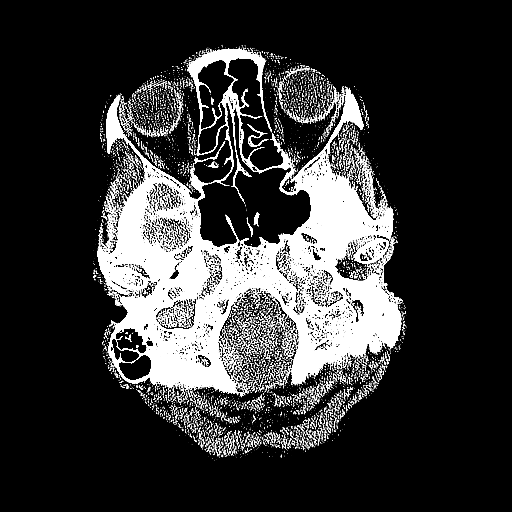
[im 5/68  bone]
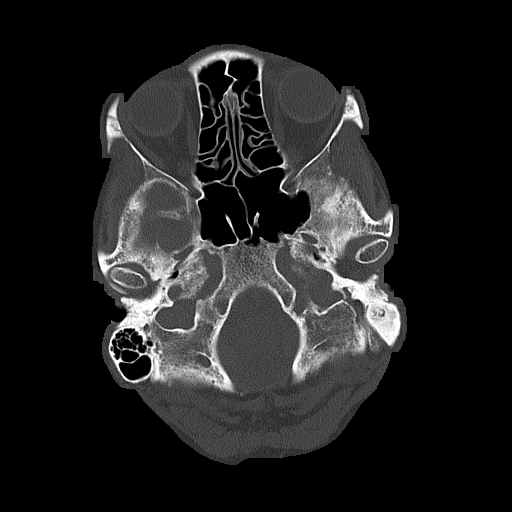
[im 12/68  brain]
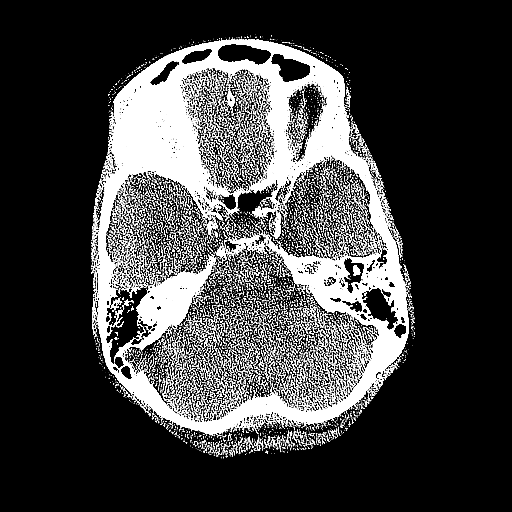
[im 19/68  brain]
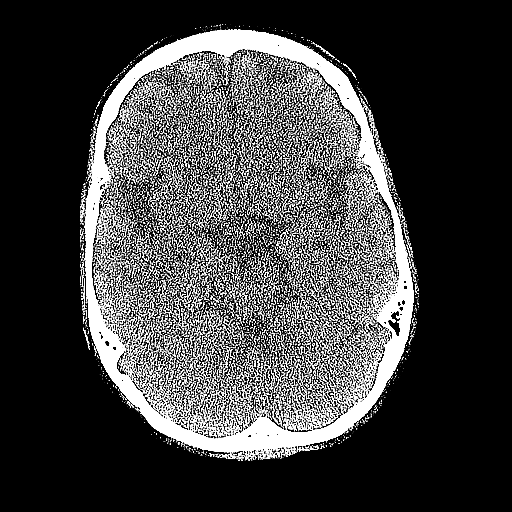
[im 24/68  brain]
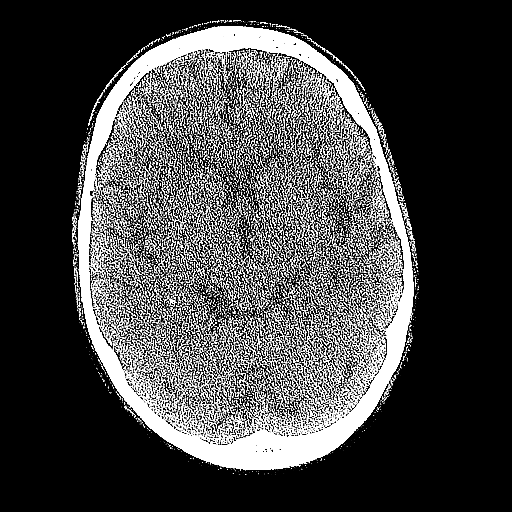
[im 31/68  brain]
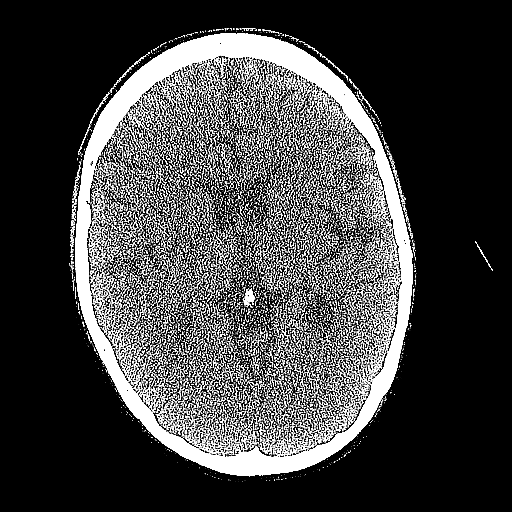
[im 31/68  bone]
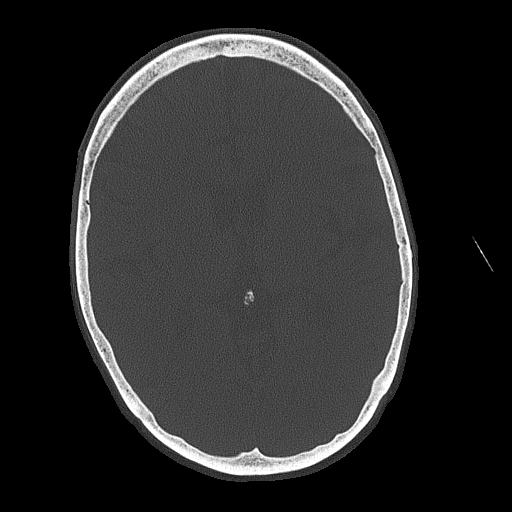
[im 37/68  brain]
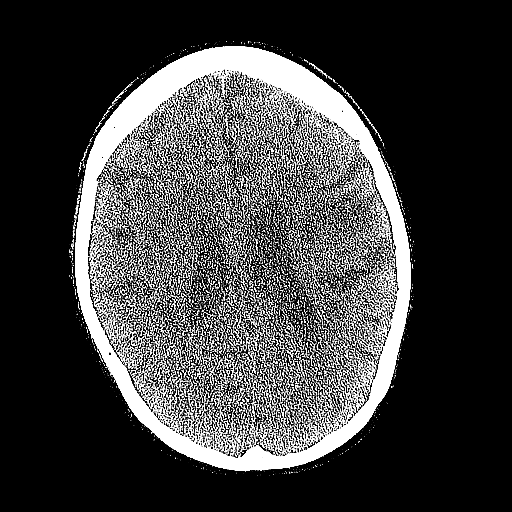
[im 44/68  brain]
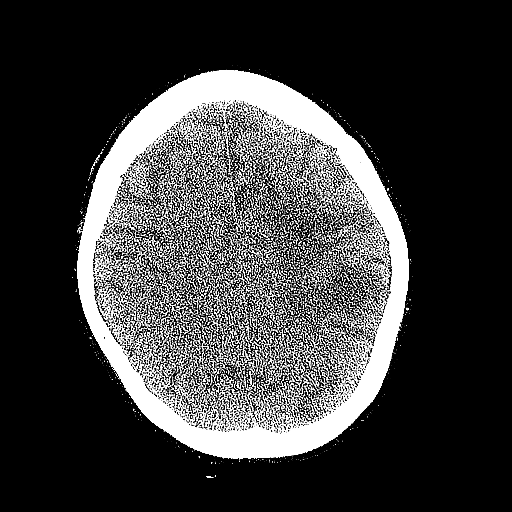
[im 51/68  brain]
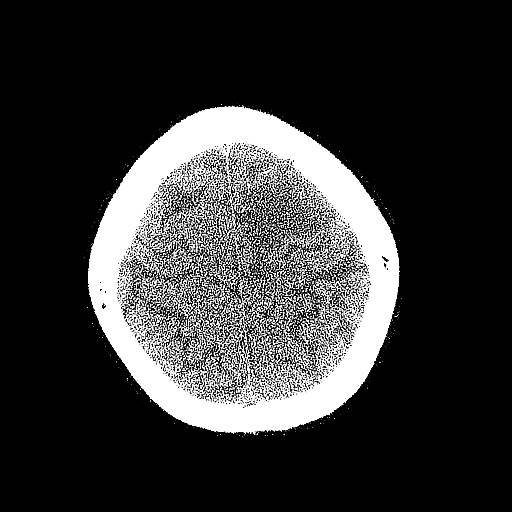
[im 56/68  brain]
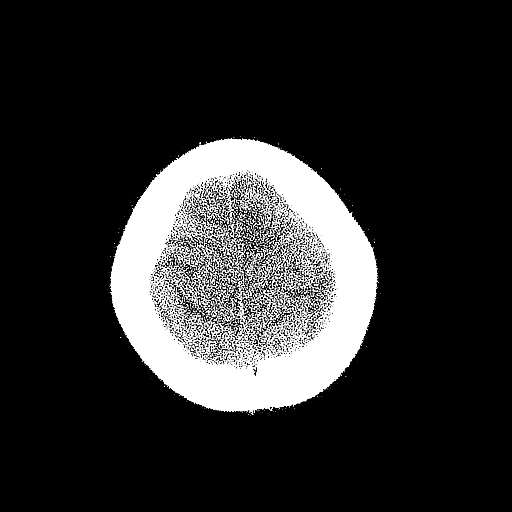
[im 56/68  bone]
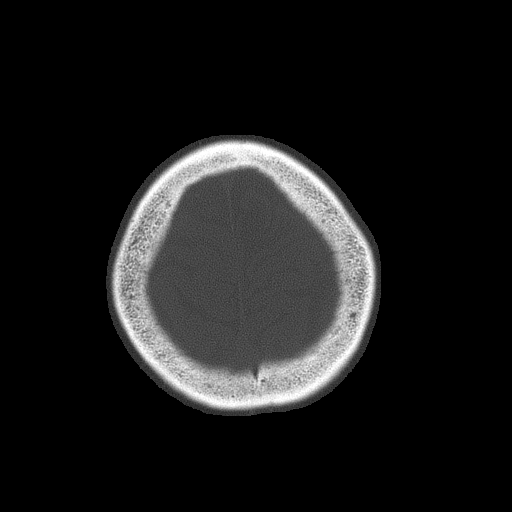
[im 63/68  brain]
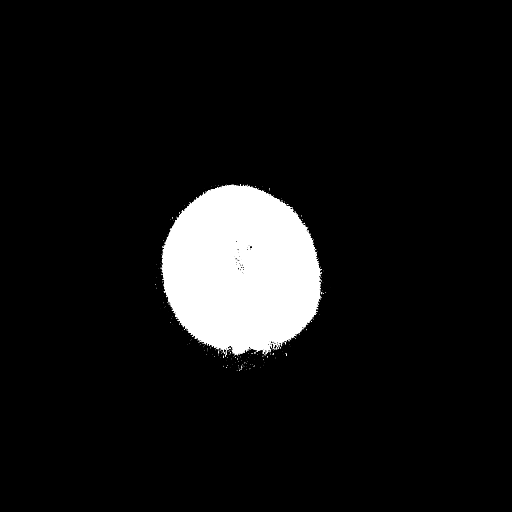

[Series 4: coronal soft tissue · coronal · 0.31mm/px · 3 of 66 slices shown]
[im 22/66  brain]
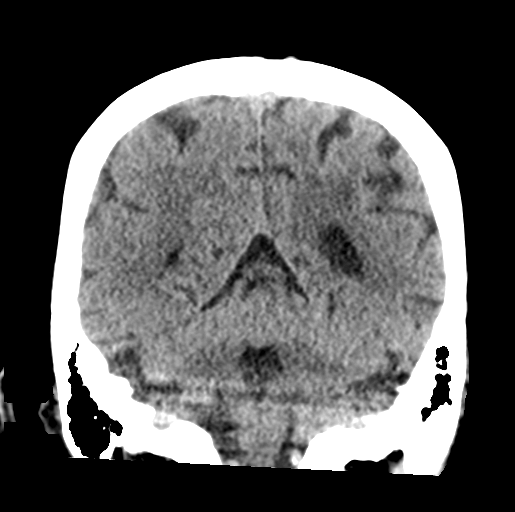
[im 29/66  brain]
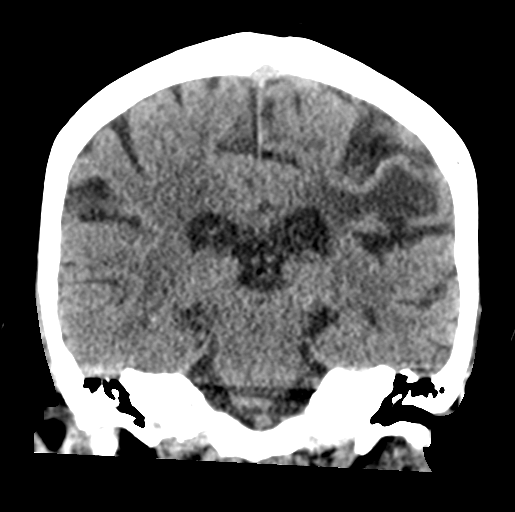
[im 37/66  brain]
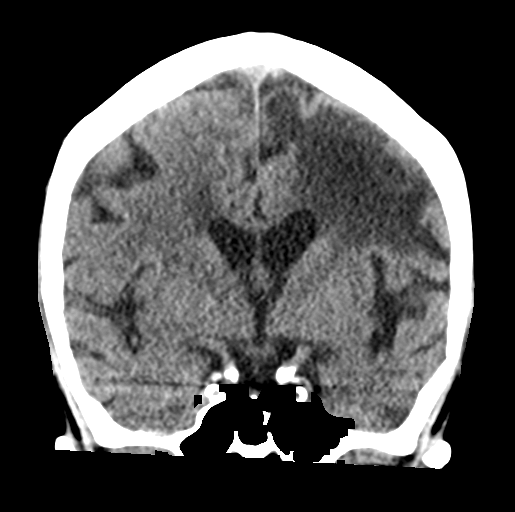

[Series 5: sagittal soft tissue · sagittal · 0.31mm/px · 3 of 53 slices shown]
[im 18/53  brain]
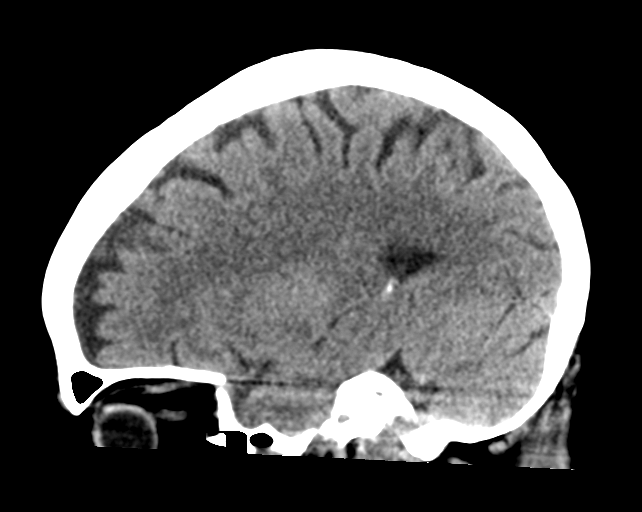
[im 27/53  brain]
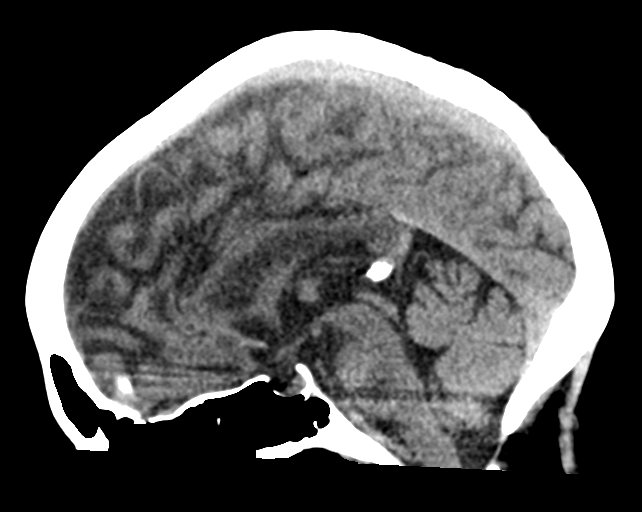
[im 35/53  brain]
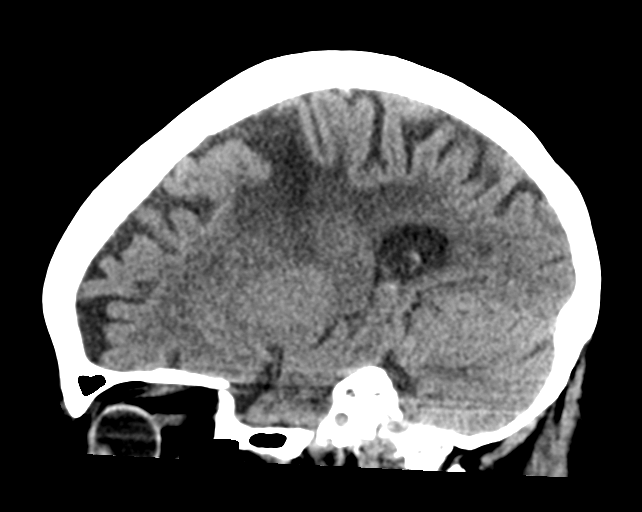

[16 of 47 positions shown; findings below may reference images not displayed]

FINDINGS: Brain: Old infarcts with encephalomalacia noted in the left frontal
and parietal lobes. There is atrophy and chronic small vessel
disease changes. No acute intracranial abnormality. Specifically, no
hemorrhage, hydrocephalus, mass lesion, acute infarction, or
significant intracranial injury.

Vascular: No hyperdense vessel or unexpected calcification.

Skull: No acute calvarial abnormality.

Sinuses/Orbits: No acute findings

Other: None
IMPRESSION: Old left MCA infarcts with encephalomalacia.

Atrophy, chronic microvascular disease.

No acute intracranial abnormality.

## 2022-03-14 ENCOUNTER — Telehealth: Payer: Self-pay | Admitting: *Deleted

## 2022-03-14 NOTE — Telephone Encounter (Signed)
Pt has shingles and requests to reschedule CT scan and follow up with Dr. Grayland Ormond to the week of 8/14. Pt was seen by [facility] walk in clinic and prescribed valtrex. Schedule message sent to reschedule appts and schedule pt for transportation as well.

## 2022-03-15 ENCOUNTER — Ambulatory Visit: Admission: RE | Admit: 2022-03-15 | Payer: Medicare Other | Source: Ambulatory Visit

## 2022-03-22 ENCOUNTER — Ambulatory Visit: Payer: Medicare Other | Admitting: Oncology

## 2022-03-31 ENCOUNTER — Inpatient Hospital Stay: Payer: Medicare Other

## 2022-03-31 ENCOUNTER — Ambulatory Visit
Admission: RE | Admit: 2022-03-31 | Discharge: 2022-03-31 | Disposition: A | Discharge status: Home / Self Care | Payer: Medicare Other | Source: Ambulatory Visit | Attending: Oncology | Admitting: Oncology

## 2022-03-31 DIAGNOSIS — C3411 Malignant neoplasm of upper lobe, right bronchus or lung: Secondary | ICD-10-CM | POA: Diagnosis not present

## 2022-03-31 LAB — POCT I-STAT CREATININE: Creatinine, Ser: 1 mg/dL (ref 0.44–1.00)

## 2022-03-31 MED ORDER — IOHEXOL 300 MG/ML  SOLN
75.0000 mL | Freq: Once | INTRAMUSCULAR | Status: AC | PRN
  Administered 2022-03-31: 75 mL via INTRAVENOUS

## 2022-04-04 ENCOUNTER — Inpatient Hospital Stay: Payer: Medicare Other

## 2022-04-04 ENCOUNTER — Encounter: Payer: Self-pay | Admitting: Nurse Practitioner

## 2022-04-04 ENCOUNTER — Inpatient Hospital Stay: Payer: Medicare Other | Attending: Oncology | Admitting: Nurse Practitioner

## 2022-04-04 VITALS — BP 118/77 | HR 82 | Temp 97.6°F | Resp 17 | Wt 149.3 lb

## 2022-04-04 DIAGNOSIS — Z08 Encounter for follow-up examination after completed treatment for malignant neoplasm: Secondary | ICD-10-CM | POA: Diagnosis not present

## 2022-04-04 DIAGNOSIS — Z8673 Personal history of transient ischemic attack (TIA), and cerebral infarction without residual deficits: Secondary | ICD-10-CM | POA: Insufficient documentation

## 2022-04-04 DIAGNOSIS — Z79899 Other long term (current) drug therapy: Secondary | ICD-10-CM | POA: Insufficient documentation

## 2022-04-04 DIAGNOSIS — C3411 Malignant neoplasm of upper lobe, right bronchus or lung: Secondary | ICD-10-CM

## 2022-04-04 DIAGNOSIS — J432 Centrilobular emphysema: Secondary | ICD-10-CM | POA: Diagnosis not present

## 2022-04-04 DIAGNOSIS — Z7901 Long term (current) use of anticoagulants: Secondary | ICD-10-CM | POA: Insufficient documentation

## 2022-04-04 DIAGNOSIS — Z85118 Personal history of other malignant neoplasm of bronchus and lung: Secondary | ICD-10-CM

## 2022-04-04 DIAGNOSIS — F17210 Nicotine dependence, cigarettes, uncomplicated: Secondary | ICD-10-CM | POA: Diagnosis not present

## 2022-04-04 NOTE — Progress Notes (Signed)
Patient here for oncology follow-up appointment,  concerns of recent shingles flare-ups

## 2022-04-04 NOTE — Progress Notes (Signed)
[place]  [phone] Fax:(336) [phone]  ID: Andrea Kennedy OB: 08/18/1945  MR#: [id number]  YDX#:[id number]   Patient Care Team: Baxter Hire, MD as PCP - General (Internal Medicine) Golden Circle, FNP (Family Medicine) Telford Nab, RN as Oncology Nurse Navigator Ottie Glazier, MD as Consulting Physician (Pulmonary Disease) Lloyd Huger, MD as Consulting Physician (Oncology) Noreene Filbert, MD as Consulting Physician (Radiation Oncology)   CHIEF COMPLAINT: Stage Ib adenocarcinoma of right upper lobe lung   INTERVAL HISTORY: Patient returns to clinic today for further evaluation and discussion of her imaging results.  She currently feels well and is at her baseline. In interim, had shingles but has now recovered. Continues to smoke. She does not complain of weakness and fatigue today.  She has no new neurologic complaints.  She denies any recent fevers or illnesses.  She has a fair appetite, but denies weight loss.  She denies any chest pain, shortness of breath, cough, or hemoptysis.  She denies any nausea, vomiting, constipation, or diarrhea.  She has no urinary complaints.  Patient offers no specific complaints today.   REVIEW OF SYSTEMS:   Review of Systems  Constitutional: Negative.  Negative for fever, malaise/fatigue and weight loss.  Respiratory: Negative.  Negative for cough, hemoptysis and shortness of breath.   Cardiovascular: Negative.  Negative for chest pain and leg swelling.  Gastrointestinal: Negative.  Negative for abdominal pain.  Genitourinary: Negative.  Negative for dysuria.  Musculoskeletal: Negative.  Negative for back pain.  Skin: Negative.  Negative for rash.  Neurological:  Positive for speech change. Negative for dizziness, focal weakness, weakness and headaches.  Psychiatric/Behavioral: Negative.  The patient is not nervous/anxious.   As per HPI. Otherwise, a complete review of systems is  negative.   PAST MEDICAL HISTORY: Past Medical History:  Diagnosis Date   Hyperlipidemia    Hypertension    Lupus ([place])    Sciatica of right side    Stroke ([place])    Tobacco abuse     PAST SURGICAL HISTORY: Past Surgical History:  Procedure Laterality Date   REVISION TOTAL HIP ARTHROPLASTY Right    TOTAL HIP ARTHROPLASTY Right 06/03/2015   Procedure: RIGHT TOTAL HIP ARTHROPLASTY ANTERIOR APPROACH;  Surgeon: Gaynelle Arabian, MD;  Location: [facility];  Service: Orthopedics;  Laterality: Right;   TUBAL LIGATION     VIDEO BRONCHOSCOPY WITH ENDOBRONCHIAL NAVIGATION N/A 05/12/2021   Procedure: VIDEO BRONCHOSCOPY WITH ENDOBRONCHIAL NAVIGATION;  Surgeon: Ottie Glazier, MD;  Location: [facility];  Service: Thoracic;  Laterality: N/A;   VIDEO BRONCHOSCOPY WITH ENDOBRONCHIAL ULTRASOUND N/A 05/12/2021   Procedure: VIDEO BRONCHOSCOPY WITH ENDOBRONCHIAL ULTRASOUND;  Surgeon: Ottie Glazier, MD;  Location: [facility];  Service: Thoracic;  Laterality: N/A;    FAMILY HISTORY: Family History  Problem Relation Age of Onset   Breast cancer Mother    Diabetes Mother    Heart attack Father    Fibromyalgia Sister     ADVANCED DIRECTIVES (Y/N):  N   HEALTH MAINTENANCE: Social History   Tobacco Use   Smoking status: Some Days    Packs/day: 0.25    Types: Cigarettes   Smokeless tobacco: Never  Substance Use Topics   Alcohol use: Yes    Comment: Once in a while.    Drug use: Never     Colonoscopy:  PAP:  Bone density:  Lipid panel:   Allergies  Allergen Reactions   Hydroxychloroquine Other (See Comments)    "Lost vision in my eyes" patient states  it took 4-5 years for her vision to return   Floxin [Ofloxacin] Other (See Comments)    Scared/shaky/pain attacks/confusion   Levaquin [Levofloxacin In D5w] Other (See Comments)    Unsure of reaction type    Current Outpatient Medications  Medication Sig Dispense Refill   acetaminophen (TYLENOL) 325 MG tablet Take 650 mg by mouth every 6  (six) hours as needed (for headache/pain).     apixaban (ELIQUIS) 5 MG TABS tablet Take 1 tablet (5 mg total) by mouth 2 (two) times daily. 60 tablet 1   atorvastatin (LIPITOR) 40 MG tablet Take 1 tablet (40 mg total) by mouth daily at 6 PM.     B Complex-C (B-COMPLEX WITH VITAMIN C) tablet Take 1 tablet by mouth daily with breakfast.     cholecalciferol (VITAMIN D) 25 MCG tablet Take 1 tablet (1,000 Units total) by mouth daily. 30 tablet 3   diltiazem (CARDIZEM CD) 120 MG 24 hr capsule Take 1 capsule (120 mg total) by mouth daily. 30 capsule 1   docusate sodium (COLACE) 100 MG capsule Take 100-200 mg by mouth at bedtime.     famotidine (PEPCID) 20 MG tablet Take 20 mg by mouth 2 (two) times daily as needed for indigestion.     Ferrous Sulfate Dried (HM SLOW RELEASE IRON) 45 MG TBCR Take 45 mg by mouth in the morning.     HYDROcodone-acetaminophen (NORCO) 7.5-325 MG tablet Take 1 tablet by mouth every 6 (six) hours as needed (pain.).     hydrOXYzine (ATARAX/VISTARIL) 25 MG tablet Take 25 mg by mouth 3 (three) times daily as needed for anxiety.     Multiple Vitamin (MULTIVITAMIN WITH MINERALS) TABS tablet Take 1 tablet by mouth daily. Centrum Silver     nicotine (NICODERM CQ - DOSED IN MG/24 HOURS) 14 mg/24hr patch Place 1 patch (14 mg total) onto the skin daily. 28 patch 0   azithromycin (ZITHROMAX Z-PAK) 250 MG tablet Follow package directions 6 each 3   methocarbamol (ROBAXIN) 500 MG tablet Take 1 tablet (500 mg total) by mouth every 6 (six) hours as needed for muscle spasms. (Patient not taking: Reported on 04/04/2022) 60 tablet 0   nortriptyline (PAMELOR) 25 MG capsule Take 25 mg by mouth at bedtime. (Patient not taking: Reported on 04/04/2022)     No current facility-administered medications for this visit.    OBJECTIVE: Vitals:   04/04/22 1028  BP: 118/77  Pulse: 82  Resp: 17  Temp: 97.6 F (36.4 C)  SpO2: 100%     Body mass index is 22.7 kg/m.    ECOG FS:0 -  Asymptomatic  General: Well-developed, well-nourished, no acute distress. Eyes: Pink conjunctiva, anicteric sclera. Lungs: Clear to auscultation bilaterally.  No audible wheezing or coughing Heart: Regular rate and rhythm.  Abdomen: Soft, nontender, nondistended.  Musculoskeletal: No edema, cyanosis, or clubbing. Neuro: Alert, answering all questions appropriately. Cranial nerves grossly intact. Skin: No rashes or petechiae noted. Psych: Normal affect.   LAB RESULTS:  Lab Results  Component Value Date   NA 140 04/09/2021   K 3.5 04/09/2021   CL 109 04/09/2021   CO2 26 04/09/2021   GLUCOSE 101 (H) 04/09/2021   BUN 17 04/09/2021   CREATININE 1.00 03/31/2022   CALCIUM 8.6 (L) 04/09/2021   PROT 7.4 04/08/2021   ALBUMIN 4.1 04/08/2021   AST 27 04/08/2021   ALT 16 04/08/2021   ALKPHOS 66 04/08/2021   BILITOT 1.0 04/08/2021   GFRNONAA >60 04/09/2021   GFRAA 48 (L)  04/23/2018    Lab Results  Component Value Date   WBC 4.7 04/09/2021   NEUTROABS 6.9 04/08/2021   HGB 11.7 (L) 04/09/2021   HCT 35.6 (L) 04/09/2021   MCV 100.3 (H) 04/09/2021   PLT 232 04/09/2021     STUDIES: CT CHEST W CONTRAST  Result Date: 04/01/2022 CLINICAL DATA:  Restaging lung cancer. EXAM: CT CHEST WITH CONTRAST TECHNIQUE: Multidetector CT imaging of the chest was performed during intravenous contrast administration. RADIATION DOSE REDUCTION: This exam was performed according to the departmental dose-optimization program which includes automated exposure control, adjustment of the mA and/or kV according to patient size and/or use of iterative reconstruction technique. CONTRAST:  19mL OMNIPAQUE IOHEXOL 300 MG/ML  SOLN COMPARISON:  PET-CT 09/20/2021 FINDINGS: Cardiovascular: The heart size appears within normal limits. No pericardial effusion. Aortic atherosclerosis and coronary artery calcifications. Mediastinum/Nodes: No enlarged mediastinal, hilar, or axillary lymph nodes. Thyroid gland, trachea, and  esophagus demonstrate no significant findings. Lungs/Pleura: Moderate centrilobular and paraseptal emphysema. No pleural effusion or airspace consolidation. Within the anterior basal right upper lobe there is a focal area architectural distortion and fibrosis corresponding to post treatment changes, image 77/3. Previously referenced nodular density within this area (presumed to represent treated tumor) currently measures 1.1 x 0.6 cm, image 76/3. On the PET-CT from 09/20/2021 this measured 1.7 x 0.8 cm. There are no new or suspicious lung nodules. Calcified granuloma noted within the posterolateral left upper lobe, image 91/3. Upper Abdomen: No acute findings within the imaged portions of the upper abdomen. Bilateral renal cortical scarring. Musculoskeletal: No acute or suspicious osseous findings. Subacute to chronic right anterior third rib fracture identified. IMPRESSION: 1. Stable post treatment changes within the anterior basal right upper lobe. The previously referenced treated lung lesion within this area is decreased in size from PET-CT dated 09/20/2021. No new sites of disease identified. 2. Subacute to chronic right anterior third rib fracture. 3. Aortic Atherosclerosis (ICD10-I70.0) and Emphysema (ICD10-J43.9). Electronically Signed   By: Kerby Moors M.D.   On: 04/01/2022 13:23    ASSESSMENT: Stage Ib adenocarcinoma of right upper lobe lung.  PLAN:   Stage Ib adenocarcinoma of right upper lobe lung: CT scan of the chest on April 08, 2021 as well as PET scan on April 22, 2021 reviewed independently confirming FDG avid 3.7 cm right upper lobe lung mass with no other obvious evidence of disease.  MRI of the brain on April 09, 2021 did not reveal any metastatic disease either.  Biopsy on May 12, 2021 confirmed diagnosis stage of disease.  Results of suspicious lymph node at station 7 were discussed with pathology.  The cells are atypical but not definitive for malignancy, therefore will  continue to consider patient a stage Ib.  She was not a surgical candidate given her history of stroke and atrial fibrillation.  Patient completed XRT on June 21, 2021.  Repeat PET scan on September 21, 2021 reported with no obvious evidence of recurrent or progressive disease.  Patient transitioned to CT scans every 6 months for at least 2 years.  CT 03/31/22 chest was independently reviewed and I agree with findings which are negative for progressive or other metastatic disease. She has expected post treatment changes in the RUL. Scan report was printed and provided to patient for her records.  Residual weakness from history of CVA: Previously referred for home pt and ot.  Incidental findings: aortic atherosclerosis, right third rib fracture, emphysema. Recommend she discuss with her pcp.  Shingles- s/p valtrex. Lesions  and infection has now resolved.  Smoker- encouraged smoking cessation.   Disposition: 6 mo- CT Chest See me few days later for results- la  I spent a total of 20 minutes reviewing chart data, face-to-face evaluation with the patient, counseling and coordination of care as detailed above.  Patient expressed understanding and was in agreement with this plan. She also understands that She can call clinic at any time with any questions, concerns, or complaints.    Cancer Staging  Primary adenocarcinoma of upper lobe of right lung [facility]) Staging form: Lung, AJCC 8th Edition - Clinical stage from 05/19/2021: Stage IB (cT2a, cN0, cM0) - Signed by Lloyd Huger, MD on 05/19/2021  Verlon Au, NP  04/04/2022

## 2022-05-09 ENCOUNTER — Ambulatory Visit
Admission: RE | Admit: 2022-05-09 | Discharge: 2022-05-09 | Disposition: A | Discharge status: Home / Self Care | Payer: Medicare Other | Source: Ambulatory Visit | Attending: Radiation Oncology | Admitting: Radiation Oncology

## 2022-05-09 ENCOUNTER — Other Ambulatory Visit: Payer: Self-pay | Admitting: *Deleted

## 2022-05-09 VITALS — BP 135/86 | HR 136 | Temp 97.9°F | Ht 67.5 in | Wt 147.2 lb

## 2022-05-09 DIAGNOSIS — C3411 Malignant neoplasm of upper lobe, right bronchus or lung: Secondary | ICD-10-CM

## 2022-05-09 DIAGNOSIS — Z923 Personal history of irradiation: Secondary | ICD-10-CM | POA: Insufficient documentation

## 2022-05-09 NOTE — Progress Notes (Signed)
Radiation Oncology Follow up Note  Name: Andrea Kennedy   Date:   05/09/2022 MRN:  [id number] DOB: 1946/02/24    This 76 y.o. female presents to the clinic today for 18-month follow-up status post SBRT to right upper lobe for adenocarcinoma with lipidic features.  REFERRING PROVIDER: Baxter Hire, MD  HPI: Patient is a 76 year old female now out 10 months having pleated SBRT to her right upper lobe for stage I adenocarcinoma with lipidic features.  Seen today in routine follow-up she is doing fairly well she did have a fall and believes she fractured a rib.  She specifically [place] cough hemoptysis or chest tightness.  She had a recent CT scan of her chest.  Showing stable posttreatment changes within the anterior basal right upper lobe with decrease in size in the prior lesion.  No new sites of disease identified.  COMPLICATIONS OF TREATMENT: none  FOLLOW UP COMPLIANCE: keeps appointments   PHYSICAL EXAM:  BP 135/86   Pulse (!) 136   Temp 97.9 F (36.6 C)   Ht 5' 7.5" (1.715 m)   Wt 147 lb 3.2 oz (66.8 kg)   BMI 22.71 kg/m  Well-developed well-nourished patient in NAD. HEENT reveals PERLA, EOMI, discs not visualized.  Oral cavity is clear. No oral mucosal lesions are identified. Neck is clear without evidence of cervical or supraclavicular adenopathy. Lungs are clear to A&P. Cardiac examination is essentially unremarkable with regular rate and rhythm without murmur rub or thrill. Abdomen is benign with no organomegaly or masses noted. Motor sensory and DTR levels are equal and symmetric in the upper and lower extremities. Cranial nerves II through XII are grossly intact. Proprioception is intact. No peripheral adenopathy or edema is identified. No motor or sensory levels are noted. Crude visual fields are within normal range.  RADIOLOGY RESULTS: CT scans reviewed compatible with above-stated findings  PLAN: Present time patient is doing well with excellent response to SBRT with low  side effect profile.  And pleased with her overall progress.  I have asked to see her at 6 months for follow-up and then we will once year follow-up visits.  Patient is to call with any concerns.  I would like to take this opportunity to thank you for allowing me to participate in the care of your patient.Noreene Filbert, MD

## 2022-05-10 IMAGING — CT NM PET TUM IMG RESTAG (PS) SKULL BASE T - THIGH
1 of 9 series · 1 of 25 positions shown · non-contrast
Comparison: April 22, 2021.

CLINICAL DATA: Subsequent treatment strategy for lung cancer in a
76-year-old female with primary adenocarcinoma of the RIGHT upper
lobe post radiotherapy.

EXAM:
NUCLEAR MEDICINE PET SKULL BASE TO THIGH
TECHNIQUE: 8.21 mCi F-18 FDG was injected intravenously. Full-ring PET imaging
was performed from the skull base to thigh after the radiotracer. CT
data was obtained and used for attenuation correction and anatomic
localization.
Fasting blood glucose: 86 mg/dl

[Series 3: ct wb 5.0 b30f · axial · 5.0mm · 0.98mm/px · 1 of 290 slices shown]
[im 290/290  brain]
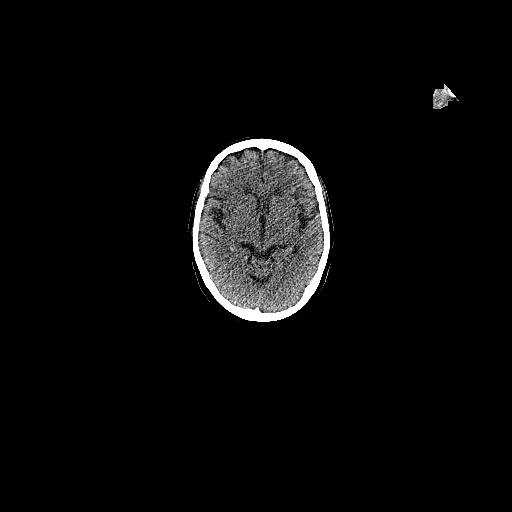

[1 of 25 positions shown; findings below may reference images not displayed]

FINDINGS: Mediastinal blood pool activity: SUV max

Liver activity: SUV max NA

NECK: No hypermetabolic lymph nodes in the neck.

Incidental CT findings: none

CHEST: At the site of previous RIGHT upper lobe mass just above the
major fissure in the anterior RIGHT upper lobe is an area of
nodularity measuring approximately 17 x 7 mm. This appears
crescentic in the rounded appearance of this previously identified
mass has changed with crescentic in linear areas and surrounding
ground-glass attenuation replacing the RIGHT upper lobe mass.
Maximum SUV of this area is 2.41.

Incidental CT findings: Mild cardiomegaly. Three-vessel coronary
artery calcification. Calcifications of the thoracic aorta without
aneurysm. Signs of pulmonary emphysema centrilobular and paraseptal
moderate and worse at the lung apices. Airways are patent. No
adenopathy by size criteria in the chest.

ABDOMEN/PELVIS: No abnormal hypermetabolic activity within the
liver, pancreas, adrenal glands, or spleen. No hypermetabolic lymph
nodes in the abdomen or pelvis.

Incidental CT findings: No acute findings in the abdomen, aortic
atherosclerosis without aneurysm. Aorta is moderate to markedly
calcified.

SKELETON: Asymmetric marrow activity seen throughout the lower
lumbar spine and in the pelvis. RIGHT iliac posteriorly with
increased metabolic activity relative to the LEFT with a maximum SUV
of 3.4. Other areas of metabolic activity scattered throughout
posterior ribs medially near costovertebral angles in the chest with
relative symmetry. Also some increased metabolic activity at the
RIGHT thoracic inlet without corresponding abnormality adjacent to
cervical musculature (image 47/3)

Incidental CT findings: Signs of RIGHT hip arthroplasty. Spinal
degenerative changes.
IMPRESSION: Marked improvement with respect to increased metabolic activity and
now with bandlike area of uptake more compatible with post treatment
changes indicative of marked response to therapy without signs of
metastatic disease.

Asymmetry with respect to FDG uptake about the posterior RIGHT iliac
crest as compared to the LEFT with generalized heterogeneity of
marrow uptake in the lumbar spine and contralateral iliac crest
present on the prior study and more likely related to asymmetric
marrow changes and or altered biomechanics along the sacroiliac
joint in the setting of RIGHT hip arthroplasty. Attention on
follow-up.

Aortic Atherosclerosis (40VPU-X6L.L) and Emphysema ([id number].Z).

## 2022-10-05 ENCOUNTER — Inpatient Hospital Stay: Payer: 59

## 2022-10-05 ENCOUNTER — Ambulatory Visit: Payer: 59

## 2022-10-07 ENCOUNTER — Inpatient Hospital Stay: Payer: 59

## 2022-10-07 ENCOUNTER — Inpatient Hospital Stay: Payer: 59 | Admitting: Nurse Practitioner

## 2022-11-17 ENCOUNTER — Ambulatory Visit: Payer: 59 | Admitting: Radiation Oncology

## 2023-04-10 ENCOUNTER — Other Ambulatory Visit: Payer: Self-pay

## 2023-04-10 ENCOUNTER — Inpatient Hospital Stay
Admission: EM | Admit: 2023-04-10 | Discharge: 2023-04-16 | DRG: 811 | Disposition: A | Discharge status: Home Health | Payer: 59 | Attending: Internal Medicine | Admitting: Internal Medicine

## 2023-04-10 DIAGNOSIS — F1410 Cocaine abuse, uncomplicated: Secondary | ICD-10-CM | POA: Diagnosis present

## 2023-04-10 DIAGNOSIS — I69351 Hemiplegia and hemiparesis following cerebral infarction affecting right dominant side: Secondary | ICD-10-CM

## 2023-04-10 DIAGNOSIS — I129 Hypertensive chronic kidney disease with stage 1 through stage 4 chronic kidney disease, or unspecified chronic kidney disease: Secondary | ICD-10-CM | POA: Diagnosis present

## 2023-04-10 DIAGNOSIS — Z8249 Family history of ischemic heart disease and other diseases of the circulatory system: Secondary | ICD-10-CM

## 2023-04-10 DIAGNOSIS — I482 Chronic atrial fibrillation, unspecified: Secondary | ICD-10-CM | POA: Diagnosis present

## 2023-04-10 DIAGNOSIS — E538 Deficiency of other specified B group vitamins: Secondary | ICD-10-CM | POA: Diagnosis present

## 2023-04-10 DIAGNOSIS — D509 Iron deficiency anemia, unspecified: Secondary | ICD-10-CM | POA: Diagnosis not present

## 2023-04-10 DIAGNOSIS — Z79899 Other long term (current) drug therapy: Secondary | ICD-10-CM

## 2023-04-10 DIAGNOSIS — N1831 Chronic kidney disease, stage 3a: Secondary | ICD-10-CM | POA: Diagnosis present

## 2023-04-10 DIAGNOSIS — K259 Gastric ulcer, unspecified as acute or chronic, without hemorrhage or perforation: Secondary | ICD-10-CM | POA: Diagnosis present

## 2023-04-10 DIAGNOSIS — D123 Benign neoplasm of transverse colon: Secondary | ICD-10-CM | POA: Diagnosis present

## 2023-04-10 DIAGNOSIS — K254 Chronic or unspecified gastric ulcer with hemorrhage: Secondary | ICD-10-CM | POA: Diagnosis not present

## 2023-04-10 DIAGNOSIS — F32A Depression, unspecified: Secondary | ICD-10-CM | POA: Diagnosis present

## 2023-04-10 DIAGNOSIS — D62 Acute posthemorrhagic anemia: Principal | ICD-10-CM | POA: Diagnosis present

## 2023-04-10 DIAGNOSIS — Z7901 Long term (current) use of anticoagulants: Secondary | ICD-10-CM

## 2023-04-10 DIAGNOSIS — I10 Essential (primary) hypertension: Secondary | ICD-10-CM | POA: Diagnosis present

## 2023-04-10 DIAGNOSIS — Z888 Allergy status to other drugs, medicaments and biological substances status: Secondary | ICD-10-CM

## 2023-04-10 DIAGNOSIS — J9601 Acute respiratory failure with hypoxia: Secondary | ICD-10-CM

## 2023-04-10 DIAGNOSIS — Z720 Tobacco use: Secondary | ICD-10-CM | POA: Diagnosis present

## 2023-04-10 DIAGNOSIS — I959 Hypotension, unspecified: Secondary | ICD-10-CM

## 2023-04-10 DIAGNOSIS — K219 Gastro-esophageal reflux disease without esophagitis: Secondary | ICD-10-CM | POA: Diagnosis present

## 2023-04-10 DIAGNOSIS — K641 Second degree hemorrhoids: Secondary | ICD-10-CM | POA: Diagnosis present

## 2023-04-10 DIAGNOSIS — Z7151 Drug abuse counseling and surveillance of drug abuser: Secondary | ICD-10-CM

## 2023-04-10 DIAGNOSIS — K253 Acute gastric ulcer without hemorrhage or perforation: Secondary | ICD-10-CM

## 2023-04-10 DIAGNOSIS — Z96641 Presence of right artificial hip joint: Secondary | ICD-10-CM | POA: Diagnosis present

## 2023-04-10 DIAGNOSIS — I69392 Facial weakness following cerebral infarction: Secondary | ICD-10-CM

## 2023-04-10 DIAGNOSIS — I69320 Aphasia following cerebral infarction: Secondary | ICD-10-CM

## 2023-04-10 DIAGNOSIS — D649 Anemia, unspecified: Principal | ICD-10-CM

## 2023-04-10 DIAGNOSIS — F419 Anxiety disorder, unspecified: Secondary | ICD-10-CM | POA: Diagnosis present

## 2023-04-10 DIAGNOSIS — D519 Vitamin B12 deficiency anemia, unspecified: Secondary | ICD-10-CM | POA: Diagnosis present

## 2023-04-10 DIAGNOSIS — J189 Pneumonia, unspecified organism: Secondary | ICD-10-CM | POA: Diagnosis present

## 2023-04-10 DIAGNOSIS — F17210 Nicotine dependence, cigarettes, uncomplicated: Secondary | ICD-10-CM | POA: Diagnosis present

## 2023-04-10 DIAGNOSIS — F418 Other specified anxiety disorders: Secondary | ICD-10-CM | POA: Diagnosis present

## 2023-04-10 DIAGNOSIS — J188 Other pneumonia, unspecified organism: Secondary | ICD-10-CM

## 2023-04-10 DIAGNOSIS — Z803 Family history of malignant neoplasm of breast: Secondary | ICD-10-CM

## 2023-04-10 DIAGNOSIS — I493 Ventricular premature depolarization: Secondary | ICD-10-CM | POA: Diagnosis present

## 2023-04-10 DIAGNOSIS — J9621 Acute and chronic respiratory failure with hypoxia: Secondary | ICD-10-CM | POA: Insufficient documentation

## 2023-04-10 DIAGNOSIS — K635 Polyp of colon: Secondary | ICD-10-CM

## 2023-04-10 DIAGNOSIS — R000 Tachycardia, unspecified: Secondary | ICD-10-CM | POA: Diagnosis present

## 2023-04-10 DIAGNOSIS — K2100 Gastro-esophageal reflux disease with esophagitis, without bleeding: Secondary | ICD-10-CM | POA: Diagnosis present

## 2023-04-10 DIAGNOSIS — Z833 Family history of diabetes mellitus: Secondary | ICD-10-CM

## 2023-04-10 DIAGNOSIS — Z1152 Encounter for screening for COVID-19: Secondary | ICD-10-CM

## 2023-04-10 DIAGNOSIS — Z716 Tobacco abuse counseling: Secondary | ICD-10-CM

## 2023-04-10 DIAGNOSIS — E785 Hyperlipidemia, unspecified: Secondary | ICD-10-CM | POA: Diagnosis present

## 2023-04-10 DIAGNOSIS — I639 Cerebral infarction, unspecified: Secondary | ICD-10-CM | POA: Diagnosis present

## 2023-04-10 LAB — CBC
HCT: 21.6 % — ABNORMAL LOW (ref 36.0–46.0)
HCT: 19.8 % — ABNORMAL LOW (ref 36.0–46.0)
Hemoglobin: 6.1 g/dL — ABNORMAL LOW (ref 12.0–15.0)
Hemoglobin: 5.6 g/dL — ABNORMAL LOW (ref 12.0–15.0)
MCH: 21.6 pg — ABNORMAL LOW (ref 26.0–34.0)
MCH: 21.6 pg — ABNORMAL LOW (ref 26.0–34.0)
MCHC: 28.2 g/dL — ABNORMAL LOW (ref 30.0–36.0)
MCHC: 28.3 g/dL — ABNORMAL LOW (ref 30.0–36.0)
MCV: 76.6 fL — ABNORMAL LOW (ref 80.0–100.0)
MCV: 76.4 fL — ABNORMAL LOW (ref 80.0–100.0)
Platelets: 321 10*3/uL (ref 150–400)
Platelets: 305 10*3/uL (ref 150–400)
RBC: 2.82 MIL/uL — ABNORMAL LOW (ref 3.87–5.11)
RBC: 2.59 MIL/uL — ABNORMAL LOW (ref 3.87–5.11)
RDW: 20 % — ABNORMAL HIGH (ref 11.5–15.5)
RDW: 20.3 % — ABNORMAL HIGH (ref 11.5–15.5)
WBC: 6 10*3/uL (ref 4.0–10.5)
WBC: 6.1 10*3/uL (ref 4.0–10.5)
nRBC: 0 % (ref 0.0–0.2)
nRBC: 0 % (ref 0.0–0.2)

## 2023-04-10 LAB — BASIC METABOLIC PANEL
Anion gap: 10 (ref 5–15)
BUN: 19 mg/dL (ref 8–23)
CO2: 23 mmol/L (ref 22–32)
Calcium: 9.1 mg/dL (ref 8.9–10.3)
Chloride: 105 mmol/L (ref 98–111)
Creatinine, Ser: 1.05 mg/dL — ABNORMAL HIGH (ref 0.44–1.00)
GFR, Estimated: 55 mL/min — ABNORMAL LOW (ref >60)
Glucose, Bld: 120 mg/dL — ABNORMAL HIGH (ref 70–99)
Potassium: 4.4 mmol/L (ref 3.5–5.1)
Sodium: 138 mmol/L (ref 135–145)

## 2023-04-10 LAB — IRON AND TIBC
Iron: 12 ug/dL — ABNORMAL LOW (ref 28–170)
Saturation Ratios: 2 % — ABNORMAL LOW (ref 10.4–31.8)
TIBC: 521 ug/dL — ABNORMAL HIGH (ref 250–450)
UIBC: 509 ug/dL

## 2023-04-10 LAB — FERRITIN: Ferritin: 6 ng/mL — ABNORMAL LOW (ref 11–307)

## 2023-04-10 LAB — FOLATE: Folate: 10.5 ng/mL (ref >5.9)

## 2023-04-10 LAB — RETICULOCYTES
Immature Retic Fract: 32.3 % — ABNORMAL HIGH (ref 2.3–15.9)
RBC.: 2.73 MIL/uL — ABNORMAL LOW (ref 3.87–5.11)
Retic Count, Absolute: 44.5 10*3/uL (ref 19.0–186.0)
Retic Ct Pct: 1.6 % (ref 0.4–3.1)

## 2023-04-10 LAB — APTT: aPTT: 30 seconds (ref 24–36)

## 2023-04-10 LAB — PREPARE RBC (CROSSMATCH)

## 2023-04-10 LAB — PROTIME-INR
INR: 1.2 (ref 0.8–1.2)
Prothrombin Time: 15.6 seconds — ABNORMAL HIGH (ref 11.4–15.2)

## 2023-04-10 MED ORDER — HYDROXYZINE HCL 50 MG PO TABS: 25.0000 mg | ORAL_TABLET | Freq: Three times a day (TID) | ORAL | Status: DC | PRN

## 2023-04-10 MED ORDER — ACETAMINOPHEN 325 MG PO TABS
650.0000 mg | ORAL_TABLET | Freq: Four times a day (QID) | ORAL | Status: DC | PRN
  Administered 2023-04-13 – 2023-04-16 (×3): 650 mg via ORAL
  Filled 2023-04-10 (×3): qty 2

## 2023-04-10 MED ORDER — VITAMIN B-12 1000 MCG PO TABS
1000.0000 ug | ORAL_TABLET | Freq: Every day | ORAL | Status: DC
  Administered 2023-04-12 – 2023-04-16 (×5): 1000 ug via ORAL
  Filled 2023-04-10 (×5): qty 1

## 2023-04-10 MED ORDER — ADULT MULTIVITAMIN W/MINERALS CH
1.0000 | ORAL_TABLET | Freq: Every day | ORAL | Status: DC
  Administered 2023-04-12 – 2023-04-16 (×5): 1 via ORAL
  Filled 2023-04-10 (×5): qty 1

## 2023-04-10 MED ORDER — METHOCARBAMOL 500 MG PO TABS: 500.0000 mg | ORAL_TABLET | Freq: Four times a day (QID) | ORAL | Status: DC | PRN

## 2023-04-10 MED ORDER — SODIUM CHLORIDE 0.9 % IV SOLN: INTRAVENOUS | Status: DC

## 2023-04-10 MED ORDER — HYDRALAZINE HCL 20 MG/ML IJ SOLN: 5.0000 mg | INTRAMUSCULAR | Status: DC | PRN

## 2023-04-10 MED ORDER — ATORVASTATIN CALCIUM 20 MG PO TABS
40.0000 mg | ORAL_TABLET | Freq: Every day | ORAL | Status: DC
  Administered 2023-04-12 – 2023-04-15 (×4): 40 mg via ORAL
  Filled 2023-04-10 (×4): qty 2

## 2023-04-10 MED ORDER — ONDANSETRON HCL 4 MG/2ML IJ SOLN
4.0000 mg | Freq: Three times a day (TID) | INTRAMUSCULAR | Status: DC | PRN
  Administered 2023-04-12: 4 mg via INTRAVENOUS
  Filled 2023-04-10: qty 2

## 2023-04-10 MED ORDER — FERROUS SULFATE 325 (65 FE) MG PO TABS
325.0000 mg | ORAL_TABLET | Freq: Every day | ORAL | Status: DC
  Administered 2023-04-12 – 2023-04-16 (×5): 325 mg via ORAL
  Filled 2023-04-10 (×5): qty 1

## 2023-04-10 MED ORDER — PANTOPRAZOLE SODIUM 40 MG IV SOLR
40.0000 mg | Freq: Two times a day (BID) | INTRAVENOUS | Status: DC
  Administered 2023-04-10 – 2023-04-15 (×10): 40 mg via INTRAVENOUS
  Filled 2023-04-10 (×10): qty 10

## 2023-04-10 MED ORDER — PANTOPRAZOLE SODIUM 40 MG PO TBEC
40.0000 mg | DELAYED_RELEASE_TABLET | Freq: Every day | ORAL | Status: DC
Start: 2023-04-11 — End: 2023-04-10

## 2023-04-10 MED ORDER — NICOTINE 21 MG/24HR TD PT24
21.0000 mg | MEDICATED_PATCH | Freq: Every day | TRANSDERMAL | Status: DC
  Filled 2023-04-10 (×4): qty 1

## 2023-04-10 MED ORDER — DILTIAZEM HCL ER COATED BEADS 120 MG PO CP24
120.0000 mg | ORAL_CAPSULE | Freq: Every day | ORAL | Status: DC
  Administered 2023-04-12 – 2023-04-13 (×2): 120 mg via ORAL
  Filled 2023-04-10 (×3): qty 1

## 2023-04-10 MED ORDER — B COMPLEX-C PO TABS
1.0000 | ORAL_TABLET | Freq: Every day | ORAL | Status: DC
  Administered 2023-04-12 – 2023-04-16 (×4): 1 via ORAL
  Filled 2023-04-10 (×6): qty 1

## 2023-04-10 MED ORDER — METOPROLOL TARTRATE 5 MG/5ML IV SOLN
2.5000 mg | INTRAVENOUS | Status: DC | PRN
  Filled 2023-04-10: qty 5

## 2023-04-10 MED ORDER — SODIUM CHLORIDE 0.9 % IV SOLN: 10.0000 mL/h | Freq: Once | INTRAVENOUS | Status: DC

## 2023-04-10 MED ORDER — HYDROCODONE-ACETAMINOPHEN 7.5-325 MG PO TABS
1.0000 | ORAL_TABLET | Freq: Four times a day (QID) | ORAL | Status: DC | PRN
  Administered 2023-04-12 – 2023-04-15 (×7): 1 via ORAL
  Filled 2023-04-10 (×7): qty 1

## 2023-04-10 MED ORDER — SODIUM CHLORIDE 0.9 % IV BOLUS
1000.0000 mL | Freq: Once | INTRAVENOUS | Status: AC
  Administered 2023-04-10: 1000 mL via INTRAVENOUS

## 2023-04-10 NOTE — ED Notes (Signed)
Pt went upstairs

## 2023-04-10 NOTE — ED Notes (Signed)
Paper consent for blood transfusion signed by pt and witness by Clinical research associate. Consent placed in the pt box in the ED.

## 2023-04-10 NOTE — ED Triage Notes (Addendum)
Pt to ED [facility] from home for generalized weakness. Was told hgb is low. ems reports has speech deficits from previous stroke.  Hx a fib

## 2023-04-10 NOTE — H&P (Signed)
History and Physical    Andrea Kennedy OFB:[id number] DOB: [birth year] DOA: 04/10/2023  Referring MD/NP/PA:   PCP: Gracelyn Nurse, MD   Patient coming from:  The patient is coming from home.     Chief Complaint: weakness and dark stool  HPI: Andrea Kennedy is a 77 y.o. female with medical history significant of A fib on Eliquis (last dose was this morning), HTN, HLD, stroke with right-sided weakness and slurred speech, GERD, depression with anxiety, CKD stage IIIa, iron deficiency anemia, lung cancer, sciatic, lupus, who presents with weakness and dark stool.  Patient states that she has generalized weakness recently, which has been progressively worsening.  She has noticed dark stool which she attributes to iron supplement use.  Denies lightheadedness or dizziness. Denies nausea vomiting, diarrhea or abdominal pain.  Patient denies chest pain, cough, SOB.  She has intermittent palpitation and heart racing.  No fever or chills.  No symptoms of UTI. She states that she had nosebleed 2 days ago which has resolved spontaneously.  Patient has mild right-sided weakness and slurred speech which are chronic issues from previous stroke. She took her last dose of Eliquis this morning.   Data reviewed independently and ED Course: pt was found to have Hgb 6.7 on 04/05/23 --> 6.1 --> 5.6, WBC 6.0, renal function close to baseline, temperature normal, blood pressure 114/96, heart rate of 125, 1 1, RR 98, oxygen saturation 98% on room air.  Patient is placed on PCU for observation.  Dr. Tobi Bastos of GI is consulted.   EKG: I have personally reviewed.  A-fib, heart rate of 126, QTc 483, PVC, early R wave progression   Review of Systems:   General: no fevers, chills, no body weight gain, has fatigue HEENT: no blurry vision, hearing changes or sore throat Respiratory: no dyspnea, coughing, wheezing CV: no chest pain, has palpitations GI: no nausea, vomiting, abdominal pain, diarrhea, constipation.  Has  dark stool GU: no dysuria, burning on urination, increased urinary frequency, hematuria  Ext: no leg edema Neuro: no vision change or hearing loss.  Has chronic right-sided weakness and slurred speech Skin: no rash, no skin tear. MSK: No muscle spasm, no deformity, no limitation of range of movement in spin Heme: No easy bruising.  Travel history: No recent long distant travel.   Allergy:  Allergies  Allergen Reactions   Hydroxychloroquine Other (See Comments)    "Lost vision in my eyes" patient states it took 4-5 years for her vision to return   Floxin [Ofloxacin] Other (See Comments)    Scared/shaky/pain attacks/confusion   Levaquin [Levofloxacin In D5w] Other (See Comments)    Unsure of reaction type    Past Medical History:  Diagnosis Date   Hyperlipidemia    Hypertension    Lupus (HCC)    Sciatica of right side    Stroke (HCC)    Tobacco abuse     Past Surgical History:  Procedure Laterality Date   REVISION TOTAL HIP ARTHROPLASTY Right    TOTAL HIP ARTHROPLASTY Right 06/03/2015   Procedure: RIGHT TOTAL HIP ARTHROPLASTY ANTERIOR APPROACH;  Surgeon: Ollen Gross, MD;  Location: [facility];  Service: Orthopedics;  Laterality: Right;   TUBAL LIGATION     VIDEO BRONCHOSCOPY WITH ENDOBRONCHIAL NAVIGATION N/A 05/12/2021   Procedure: VIDEO BRONCHOSCOPY WITH ENDOBRONCHIAL NAVIGATION;  Surgeon: Vida Rigger, MD;  Location: [facility];  Service: Thoracic;  Laterality: N/A;   VIDEO BRONCHOSCOPY WITH ENDOBRONCHIAL ULTRASOUND N/A 05/12/2021   Procedure: VIDEO BRONCHOSCOPY WITH ENDOBRONCHIAL ULTRASOUND;  Surgeon: Vida Rigger, MD;  Location: [facility];  Service: Thoracic;  Laterality: N/A;    Social History:  reports that she has been smoking cigarettes. She has never used smokeless tobacco. She reports current alcohol use. She reports that she does not use drugs.  Family History:  Family History  Problem Relation Age of Onset   Breast cancer Mother    Diabetes Mother    Heart  attack Father    Fibromyalgia Sister      Prior to Admission medications   Medication Sig Start Date End Date Taking? Authorizing Provider  acetaminophen (TYLENOL) 325 MG tablet Take 650 mg by mouth every 6 (six) hours as needed (for headache/pain).    [provider]  apixaban (ELIQUIS) 5 MG TABS tablet Take 1 tablet (5 mg total) by mouth 2 (two) times daily. 05/09/17   Veryl Speak, FNP  atorvastatin (LIPITOR) 40 MG tablet Take 1 tablet (40 mg total) by mouth daily at 6 PM. 04/10/21   Enedina Finner, MD  B Complex-C (B-COMPLEX WITH VITAMIN C) tablet Take 1 tablet by mouth daily with breakfast.    [provider]  cholecalciferol (VITAMIN D) 25 MCG tablet Take 1 tablet (1,000 Units total) by mouth daily. 04/11/21   Enedina Finner, MD  diltiazem (CARDIZEM CD) 120 MG 24 hr capsule Take 1 capsule (120 mg total) by mouth daily. 04/10/21   Enedina Finner, MD  docusate sodium (COLACE) 100 MG capsule Take 100-200 mg by mouth at bedtime.    [provider]  famotidine (PEPCID) 20 MG tablet Take 20 mg by mouth 2 (two) times daily as needed for indigestion.    [provider]  Ferrous Sulfate Dried (HM SLOW RELEASE IRON) 45 MG TBCR Take 45 mg by mouth in the morning.    [provider]  HYDROcodone-acetaminophen (NORCO) 7.5-325 MG tablet Take 1 tablet by mouth every 6 (six) hours as needed (pain.). 04/16/21   [provider]  hydrOXYzine (ATARAX/VISTARIL) 25 MG tablet Take 25 mg by mouth 3 (three) times daily as needed for anxiety. 03/17/21   [provider]  methocarbamol (ROBAXIN) 500 MG tablet Take 1 tablet (500 mg total) by mouth every 6 (six) hours as needed for muscle spasms. Patient not taking: Reported on 04/04/2022 08/03/16   Charlton Amor, PA-C  Multiple Vitamin (MULTIVITAMIN WITH MINERALS) TABS tablet Take 1 tablet by mouth daily. Centrum Silver    [provider]  nicotine (NICODERM CQ - DOSED IN MG/24 HOURS) 14 mg/24hr patch  Place 1 patch (14 mg total) onto the skin daily. 04/11/21   Enedina Finner, MD  nortriptyline (PAMELOR) 25 MG capsule Take 25 mg by mouth at bedtime. Patient not taking: Reported on 04/04/2022 04/16/21   [provider]    Physical Exam: Vitals:   04/10/23 1557 04/10/23 1953 04/10/23 1954 04/10/23 2037  BP:   100/65 125/61  Pulse:   96 (!) 107  Resp:   20 20  Temp:  98.3 F (36.8 C)  97.6 F (36.4 C)  TempSrc:  Oral  Oral  SpO2:   100% 100%  Weight: 63.5 kg     Height: 5\' 8"  (1.727 m)      General: Not in acute distress.  Pale looking HEENT:       Eyes: PERRL, EOMI, no jaundice       ENT: No discharge from the ears and nose, no pharynx injection, no tonsillar enlargement.        Neck: No  JVD, no bruit, no mass felt. Heme: No neck lymph node enlargement. Cardiac: S1/S2, RRR, No murmurs, No gallops or rubs. Respiratory: No rales, wheezing, rhonchi or rubs. GI: Soft, nondistended, nontender, no rebound pain, no organomegaly, BS present. GU: No hematuria Ext: No pitting leg edema bilaterally. 1+DP/PT pulse bilaterally. Musculoskeletal: No joint deformities, No joint redness or warmth, no limitation of ROM in spin. Skin: No rashes.  Neuro: Alert, oriented X3, cranial nerves II-XII grossly intact.  Has chronic mild right-sided weakness and slurred speech Psych: Patient is not psychotic, no suicidal or hemocidal ideation.  Labs on Admission: I have personally reviewed following labs and imaging studies  CBC: Recent Labs  Lab 04/10/23 1559 04/10/23 2030  WBC 6.0 6.1  HGB 6.1* 5.6*  HCT 21.6* 19.8*  MCV 76.6* 76.4*  PLT 321 305   Basic Metabolic Panel: Recent Labs  Lab 04/10/23 1559  NA 138  K 4.4  CL 105  CO2 23  GLUCOSE 120*  BUN 19  CREATININE 1.05*  CALCIUM 9.1   GFR: Estimated Creatinine Clearance: 46.4 mL/min (A) (by C-G formula based on SCr of 1.05 mg/dL (H)). Liver Function Tests: No results for input(s): "AST", "ALT", "ALKPHOS", "BILITOT", "PROT",  "ALBUMIN" in the last 168 hours. No results for input(s): "LIPASE", "AMYLASE" in the last 168 hours. No results for input(s): "AMMONIA" in the last 168 hours. Coagulation Profile: No results for input(s): "INR", "PROTIME" in the last 168 hours. Cardiac Enzymes: No results for input(s): "CKTOTAL", "CKMB", "CKMBINDEX", "TROPONINI" in the last 168 hours. BNP (last 3 results) No results for input(s): "PROBNP" in the last 8760 hours. HbA1C: No results for input(s): "HGBA1C" in the last 72 hours. CBG: No results for input(s): "GLUCAP" in the last 168 hours. Lipid Profile: No results for input(s): "CHOL", "HDL", "LDLCALC", "TRIG", "CHOLHDL", "LDLDIRECT" in the last 72 hours. Thyroid Function Tests: No results for input(s): "TSH", "T4TOTAL", "FREET4", "T3FREE", "THYROIDAB" in the last 72 hours. Anemia Panel: Recent Labs    04/10/23 1559  FOLATE 10.5  FERRITIN 6*  TIBC 521*  IRON 12*  RETICCTPCT 1.6   Urine analysis:    Component Value Date/Time   COLORURINE YELLOW (A) 04/08/2021 2027   APPEARANCEUR HAZY (A) 04/08/2021 2027   LABSPEC 1.013 04/08/2021 2027   PHURINE 5.0 04/08/2021 2027   GLUCOSEU NEGATIVE 04/08/2021 2027   HGBUR NEGATIVE 04/08/2021 2027   BILIRUBINUR NEGATIVE 04/08/2021 2027   KETONESUR NEGATIVE 04/08/2021 2027   PROTEINUR NEGATIVE 04/08/2021 2027   NITRITE NEGATIVE 04/08/2021 2027   LEUKOCYTESUR TRACE (A) 04/08/2021 2027   Sepsis Labs: @LABRCNTIP (procalcitonin:4,lacticidven:4) )No results found for this or any previous visit (from the past 240 hour(s)).   Radiological Exams on Admission: No results found.    Assessment/Plan Principal Problem:   Symptomatic anemia Active Problems:   Iron deficiency anemia   B12 deficiency   Gastroesophageal reflux disease with esophagitis   Chronic atrial fibrillation with RVR (HCC)   Benign essential HTN   Stroke (cerebrum) (HCC) - L frontal embolic s/p IV tPA, d/t AF   Chronic kidney disease, stage 3a (HCC)    Depression with anxiety   Cocaine abuse (HCC)   Tobacco abuse   Assessment and Plan:  Symptomatic anemia: Hgb dropped from 6.7 on 04/05/23 --> 6.1 --> 5.6. pt had nosebleeding 2 days ago, but hemoglobin continues to drop.  Patient reports dark stool which she attributes to iron supplement use, suspecting GI blood loss since patient is on Eliquis.  Currently hemodynamically stable, but has tachycardia.  -  will place in PCU obs due to A fib with RVR - Hold Eliquis - transfuse 2 units of blood now - IVF: 1L NS bolus, then at 75 mL/hr - Start IV pantoprazole 40 mg bid - Check anemia panel - Zofran IV for nausea - Avoid NSAIDs and SQ heparin - Maintain IV access (2 large bore IVs if possible). - Monitor closely and follow q6h cbc, transfuse as necessary, if Hgb<7.0 - LaB: INR, PTT and type screen - Consulted Dr. Tobi Bastos of GI. - clear diet now, NPO after MN,   Iron deficiency anemia and B12 deficiency -Continue iron supplement and vitamin B 12 supplement  Gastroesophageal reflux disease with esophagitis -On IV Protonix  Chronic atrial fibrillation with RVR (HCC): Heart rate 120s --> 100s -continue Cardizem -As needed IV metoprolol 2.5 mg every 2 hours for heart rate> 125 -Hold Eliquis  Benign essential HTN -IV hydralazine as needed -Patient is on Cardizem  History of stroke (cerebrum) (HCC) - L frontal embolic s/p IV tPA, d/t AF -Lipitor -Hold Eliquis  Chronic kidney disease, stage 3a (HCC): Renal function close to baseline.  Creatinine 1.05, BUN 19, GFR 55.  Recent baseline creatinine 0.9 on 04/05/2023 -Follow-up with BMP  Depression with anxiety -Continue home as needed hydroxyzine  History of cocaine abuse (HCC) and Tobacco abuse -Did counseling about importance of quitting substance use -Nicotine patch        DVT ppx: SCD  Code Status: Full code per pt  Family Communication:  Yes, patient's husband by phone  Disposition Plan:  Anticipate discharge back to  previous environment  Consults called:  Dr. Tobi Bastos of GI is consulted.  Admission status and Level of care: Progressive:  for obs   Dispo: The patient is from: Home              Anticipated d/c is to: Home              Anticipated d/c date is: 1 day              Patient currently is not medically stable to d/c.    Severity of Illness:  The appropriate patient status for this patient is OBSERVATION. Observation status is judged to be reasonable and necessary in order to provide the required intensity of service to ensure the patient's safety. The patient's presenting symptoms, physical exam findings, and initial radiographic and laboratory data in the context of their medical condition is felt to place them at decreased risk for further clinical deterioration. Furthermore, it is anticipated that the patient will be medically stable for discharge from the hospital within 2 midnights of admission.        Date of Service 04/10/2023    Lorretta Harp [facility]   If 7PM-7AM, please contact night-coverage [email or web address] 04/10/2023, 9:13 PM

## 2023-04-10 NOTE — ED Provider Notes (Incomplete)
[facility] Provider Note    Event Date/Time   First MD Initiated Contact with Patient 04/10/23 1733     (approximate)   History   No chief complaint on file.   HPI  Andrea Kennedy is a 77 y.o. female past medical history significant for stage I adenocarcinoma, lupus, prior CVA (residual right-sided weakness), atrial fibrillation on Eliquis, hypertension, hyperlipidemia, who presents to the emergency department with anemia.  Patient states that she was told to come into the emergency department for low hemoglobin level.  States that she has been having some generalized weakness and fatigue.  Denies any blood in her stool or melanotic stool.  States that he she has had a history of iron deficiency and B12 deficiency in the past.  Denies any falls or trauma.  No chest pain.  Denies any shortness of breath.  Took her last dose of Eliquis this morning.  Denies any significant alcohol use.     Physical Exam   Triage Vital Signs: ED Triage Vitals  Encounter Vitals Group     BP 04/10/23 1556 (!) 114/96     Systolic BP Percentile --      Diastolic BP Percentile --      Pulse Rate 04/10/23 1556 (!) 125     Resp 04/10/23 1556 18     Temp 04/10/23 1556 98.8 F (37.1 C)     Temp src --      SpO2 04/10/23 1556 98 %     Weight 04/10/23 1557 140 lb (63.5 kg)     Height 04/10/23 1557 5\' 8"  (1.727 m)     Head Circumference --      Peak Flow --      Pain Score 04/10/23 1556 0     Pain Loc --      Pain Education --      Exclude from Growth Chart --     Most recent vital signs: Vitals:   04/10/23 2330 04/11/23 0028  BP: 116/73 121/71  Pulse: (!) 51 (!) 102  Resp: 20 20  Temp: 98.5 F (36.9 C) 98.2 F (36.8 C)  SpO2: 95% 96%    Physical Exam Constitutional:      Appearance: She is well-developed.  HENT:     Head: Atraumatic.  Eyes:     Comments: Pale conjunctiva  Cardiovascular:     Rate and Rhythm: Tachycardia present. Rhythm irregular.   Pulmonary:     Effort: No respiratory distress.  Abdominal:     General: There is no distension.     Tenderness: There is no abdominal tenderness.  Genitourinary:    Comments: No gross blood or melena Musculoskeletal:        General: Normal range of motion.     Cervical back: Normal range of motion.  Skin:    General: Skin is warm.     Capillary Refill: Capillary refill takes less than 2 seconds.  Neurological:     Mental Status: She is alert. Mental status is at baseline.     IMPRESSION / MDM / ASSESSMENT AND PLAN / ED COURSE  I reviewed the triage vital signs and the nursing notes.  Differential diagnosis including anemia secondary to chronic disease, iron deficiency anemia, GI bleed secondary to Eliquis  EKG  I, Corena Herter, the attending physician, personally viewed and interpreted this ECG.   Rate:120  Rhythm: Atrial fibrillation  Axis: Normal  Intervals: Normal  ST&T Change: Nonspecific ST changes  Atrial fibrillation with  rapid rate while on cardiac telemetry.    LABS (all labs ordered are listed, but only abnormal results are displayed) Labs interpreted as -    Labs Reviewed  CBC - Abnormal; Notable for the following components:      Result Value   RBC 2.82 (*)    Hemoglobin 6.1 (*)    HCT 21.6 (*)    MCV 76.6 (*)    MCH 21.6 (*)    MCHC 28.2 (*)    RDW 20.0 (*)    All other components within normal limits  BASIC METABOLIC PANEL - Abnormal; Notable for the following components:   Glucose, Bld 120 (*)    Creatinine, Ser 1.05 (*)    GFR, Estimated 55 (*)    All other components within normal limits  IRON AND TIBC - Abnormal; Notable for the following components:   Iron 12 (*)    TIBC 521 (*)    Saturation Ratios 2 (*)    All other components within normal limits  FERRITIN - Abnormal; Notable for the following components:   Ferritin 6 (*)    All other components within normal limits  RETICULOCYTES - Abnormal; Notable for the following  components:   RBC. 2.73 (*)    Immature Retic Fract 32.3 (*)    All other components within normal limits  PROTIME-INR - Abnormal; Notable for the following components:   Prothrombin Time 15.6 (*)    All other components within normal limits  CBC - Abnormal; Notable for the following components:   RBC 2.59 (*)    Hemoglobin 5.6 (*)    HCT 19.8 (*)    MCV 76.4 (*)    MCH 21.6 (*)    MCHC 28.3 (*)    RDW 20.3 (*)    All other components within normal limits  FOLATE  APTT  VITAMIN B12  CBC  CBC  BASIC METABOLIC PANEL  CBC  CBC  PREPARE RBC (CROSSMATCH)  TYPE AND SCREEN     MDM  Patient presents to the emergency department with anemia.  Hemoglobin was low at 6.1 with a baseline close to 11.  Rectal exam with no gross blood or melena but did not have much stool in the rectal vault.  Denies any recent GI bleed.  Patient was typed and crossed 1 unit PRBC.  Given no hypotension and no obvious source of GI bleed we will hold on reversing Eliquis given her history of stroke and atrial fibrillation.  Consulted hospitalist for admission for anemia requiring blood transfusion.     PROCEDURES:  Critical Care performed: yes  .Critical Care  Performed by: Corena Herter, MD Authorized by: Corena Herter, MD   Critical care provider statement:    Critical care time (minutes):  30   Critical care time was exclusive of:  Separately billable procedures and treating other patients   Critical care was necessary to treat or prevent imminent or life-threatening deterioration of the following conditions:  Circulatory failure   Critical care was time spent personally by me on the following activities:  Development of treatment plan with patient or surrogate, discussions with consultants, evaluation of patient's response to treatment, examination of patient, ordering and review of laboratory studies, ordering and review of radiographic studies, ordering and performing treatments and  interventions, pulse oximetry, re-evaluation of patient's condition and review of old charts   Patient's presentation is most consistent with acute presentation with potential threat to life or bodily function.   MEDICATIONS ORDERED IN ED: Medications  0.9 %  sodium chloride infusion (0 mL/hr Intravenous Hold 04/10/23 1859)  ondansetron (ZOFRAN) injection 4 mg (has no administration in time range)  acetaminophen (TYLENOL) tablet 650 mg (has no administration in time range)  hydrALAZINE (APRESOLINE) injection 5 mg (has no administration in time range)  nicotine (NICODERM CQ - dosed in mg/24 hours) patch 21 mg (21 mg Transdermal Not Given 04/10/23 2116)  0.9 %  sodium chloride infusion ( Intravenous New Bag/Given 04/10/23 2131)  metoprolol tartrate (LOPRESSOR) injection 2.5 mg (has no administration in time range)  HYDROcodone-acetaminophen (NORCO) 7.5-325 MG per tablet 1 tablet (has no administration in time range)  atorvastatin (LIPITOR) tablet 40 mg (has no administration in time range)  diltiazem (CARDIZEM CD) 24 hr capsule 120 mg (has no administration in time range)  hydrOXYzine (ATARAX) tablet 25 mg (has no administration in time range)  ferrous sulfate tablet 325 mg (has no administration in time range)  methocarbamol (ROBAXIN) tablet 500 mg (has no administration in time range)  B-complex with vitamin C tablet 1 tablet (has no administration in time range)  multivitamin with minerals tablet 1 tablet (has no administration in time range)  cyanocobalamin (VITAMIN B12) tablet 1,000 mcg (has no administration in time range)  pantoprazole (PROTONIX) injection 40 mg (40 mg Intravenous Given 04/10/23 2127)  sodium chloride 0.9 % bolus 1,000 mL (1,000 mLs Intravenous New Bag/Given 04/10/23 1953)    FINAL CLINICAL IMPRESSION(S) / ED DIAGNOSES   Final diagnoses:  Anemia, unspecified type     Rx / DC Orders   ED Discharge Orders     None        Note:  This document was prepared  using Dragon voice recognition software and may include unintentional dictation errors.   Corena Herter, MD 04/11/23 [place] Bruns    Corena Herter, MD 04/11/23 0030

## 2023-04-10 NOTE — ED Provider Notes (Incomplete)
   [facility] Provider Note    Event Date/Time   First MD Initiated Contact with Patient 04/10/23 1733     (approximate)   History   No chief complaint on file.   HPI  Andrea Kennedy is a 77 y.o. female  ***       Physical Exam   Triage Vital Signs: ED Triage Vitals  Encounter Vitals Group     BP 04/10/23 1556 (!) 114/96     Systolic BP Percentile --      Diastolic BP Percentile --      Pulse Rate 04/10/23 1556 (!) 125     Resp 04/10/23 1556 18     Temp 04/10/23 1556 98.8 F (37.1 C)     Temp src --      SpO2 04/10/23 1556 98 %     Weight 04/10/23 1557 140 lb (63.5 kg)     Height 04/10/23 1557 5\' 8"  (1.727 m)     Head Circumference --      Peak Flow --      Pain Score 04/10/23 1556 0     Pain Loc --      Pain Education --      Exclude from Growth Chart --     Most recent vital signs: Vitals:   04/10/23 1556  BP: (!) 114/96  Pulse: (!) 125  Resp: 18  Temp: 98.8 F (37.1 C)  SpO2: 98%    Physical Exam  IMPRESSION / MDM / ASSESSMENT AND PLAN / ED COURSE  I reviewed the triage vital signs and the nursing notes.  *** EKG  I, Corena Herter, the attending physician, personally viewed and interpreted this ECG.   Rate: Normal  Rhythm: Normal sinus  Axis: Normal  Intervals: Normal  ST&T Change: None  No tachycardic or bradycardic dysrhythmias while on cardiac telemetry.  RADIOLOGY I independently reviewed imaging, my interpretation of imaging: ***  LABS (all labs ordered are listed, but only abnormal results are displayed) Labs interpreted as -    Labs Reviewed  CBC - Abnormal; Notable for the following components:      Result Value   RBC 2.82 (*)    Hemoglobin 6.1 (*)    HCT 21.6 (*)    MCV 76.6 (*)    MCH 21.6 (*)    MCHC 28.2 (*)    RDW 20.0 (*)    All other components within normal limits  BASIC METABOLIC PANEL - Abnormal; Notable for the following components:   Glucose, Bld 120 (*)    Creatinine, Ser  1.05 (*)    GFR, Estimated 55 (*)    All other components within normal limits     MDM       PROCEDURES:  Critical Care performed: No  Procedures  Patient's presentation is most consistent with {EM COPA:27473}   MEDICATIONS ORDERED IN ED: Medications - No data to display  FINAL CLINICAL IMPRESSION(S) / ED DIAGNOSES   Final diagnoses:  None     Rx / DC Orders   ED Discharge Orders     None        Note:  This document was prepared using Dragon voice recognition software and may include unintentional dictation errors.

## 2023-04-11 DIAGNOSIS — I69351 Hemiplegia and hemiparesis following cerebral infarction affecting right dominant side: Secondary | ICD-10-CM | POA: Diagnosis not present

## 2023-04-11 DIAGNOSIS — F17210 Nicotine dependence, cigarettes, uncomplicated: Secondary | ICD-10-CM | POA: Diagnosis present

## 2023-04-11 DIAGNOSIS — K253 Acute gastric ulcer without hemorrhage or perforation: Secondary | ICD-10-CM | POA: Diagnosis not present

## 2023-04-11 DIAGNOSIS — F419 Anxiety disorder, unspecified: Secondary | ICD-10-CM | POA: Diagnosis present

## 2023-04-11 DIAGNOSIS — E785 Hyperlipidemia, unspecified: Secondary | ICD-10-CM | POA: Diagnosis present

## 2023-04-11 DIAGNOSIS — Z803 Family history of malignant neoplasm of breast: Secondary | ICD-10-CM | POA: Diagnosis not present

## 2023-04-11 DIAGNOSIS — K254 Chronic or unspecified gastric ulcer with hemorrhage: Secondary | ICD-10-CM | POA: Diagnosis present

## 2023-04-11 DIAGNOSIS — N1831 Chronic kidney disease, stage 3a: Secondary | ICD-10-CM | POA: Diagnosis present

## 2023-04-11 DIAGNOSIS — D500 Iron deficiency anemia secondary to blood loss (chronic): Secondary | ICD-10-CM

## 2023-04-11 DIAGNOSIS — F32A Depression, unspecified: Secondary | ICD-10-CM | POA: Diagnosis present

## 2023-04-11 DIAGNOSIS — Z1152 Encounter for screening for COVID-19: Secondary | ICD-10-CM | POA: Diagnosis not present

## 2023-04-11 DIAGNOSIS — Z7901 Long term (current) use of anticoagulants: Secondary | ICD-10-CM | POA: Diagnosis not present

## 2023-04-11 DIAGNOSIS — I129 Hypertensive chronic kidney disease with stage 1 through stage 4 chronic kidney disease, or unspecified chronic kidney disease: Secondary | ICD-10-CM | POA: Diagnosis present

## 2023-04-11 DIAGNOSIS — D519 Vitamin B12 deficiency anemia, unspecified: Secondary | ICD-10-CM | POA: Diagnosis present

## 2023-04-11 DIAGNOSIS — J9621 Acute and chronic respiratory failure with hypoxia: Secondary | ICD-10-CM | POA: Diagnosis present

## 2023-04-11 DIAGNOSIS — D508 Other iron deficiency anemias: Secondary | ICD-10-CM | POA: Diagnosis not present

## 2023-04-11 DIAGNOSIS — I9589 Other hypotension: Secondary | ICD-10-CM | POA: Diagnosis not present

## 2023-04-11 DIAGNOSIS — I482 Chronic atrial fibrillation, unspecified: Secondary | ICD-10-CM | POA: Diagnosis present

## 2023-04-11 DIAGNOSIS — I10 Essential (primary) hypertension: Secondary | ICD-10-CM | POA: Diagnosis not present

## 2023-04-11 DIAGNOSIS — F1410 Cocaine abuse, uncomplicated: Secondary | ICD-10-CM | POA: Diagnosis present

## 2023-04-11 DIAGNOSIS — Z8249 Family history of ischemic heart disease and other diseases of the circulatory system: Secondary | ICD-10-CM | POA: Diagnosis not present

## 2023-04-11 DIAGNOSIS — Z79899 Other long term (current) drug therapy: Secondary | ICD-10-CM | POA: Diagnosis not present

## 2023-04-11 DIAGNOSIS — J9601 Acute respiratory failure with hypoxia: Secondary | ICD-10-CM | POA: Diagnosis not present

## 2023-04-11 DIAGNOSIS — K635 Polyp of colon: Secondary | ICD-10-CM | POA: Diagnosis not present

## 2023-04-11 DIAGNOSIS — Z96641 Presence of right artificial hip joint: Secondary | ICD-10-CM | POA: Diagnosis present

## 2023-04-11 DIAGNOSIS — K219 Gastro-esophageal reflux disease without esophagitis: Secondary | ICD-10-CM | POA: Diagnosis present

## 2023-04-11 DIAGNOSIS — D62 Acute posthemorrhagic anemia: Secondary | ICD-10-CM | POA: Diagnosis present

## 2023-04-11 DIAGNOSIS — J189 Pneumonia, unspecified organism: Secondary | ICD-10-CM | POA: Diagnosis present

## 2023-04-11 DIAGNOSIS — I959 Hypotension, unspecified: Secondary | ICD-10-CM | POA: Diagnosis not present

## 2023-04-11 DIAGNOSIS — D123 Benign neoplasm of transverse colon: Secondary | ICD-10-CM | POA: Diagnosis present

## 2023-04-11 DIAGNOSIS — D509 Iron deficiency anemia, unspecified: Secondary | ICD-10-CM | POA: Diagnosis present

## 2023-04-11 LAB — CBC
HCT: 22.2 % — ABNORMAL LOW (ref 36.0–46.0)
HCT: 21.7 % — ABNORMAL LOW (ref 36.0–46.0)
HCT: 28.4 % — ABNORMAL LOW (ref 36.0–46.0)
HCT: 28.4 % — ABNORMAL LOW (ref 36.0–46.0)
Hemoglobin: 6.8 g/dL — ABNORMAL LOW (ref 12.0–15.0)
Hemoglobin: 6.7 g/dL — ABNORMAL LOW (ref 12.0–15.0)
Hemoglobin: 8.7 g/dL — ABNORMAL LOW (ref 12.0–15.0)
Hemoglobin: 8.6 g/dL — ABNORMAL LOW (ref 12.0–15.0)
MCH: 23.3 pg — ABNORMAL LOW (ref 26.0–34.0)
MCH: 23.3 pg — ABNORMAL LOW (ref 26.0–34.0)
MCH: 24.3 pg — ABNORMAL LOW (ref 26.0–34.0)
MCH: 24.2 pg — ABNORMAL LOW (ref 26.0–34.0)
MCHC: 30.6 g/dL (ref 30.0–36.0)
MCHC: 30.9 g/dL (ref 30.0–36.0)
MCHC: 30.6 g/dL (ref 30.0–36.0)
MCHC: 30.3 g/dL (ref 30.0–36.0)
MCV: 76 fL — ABNORMAL LOW (ref 80.0–100.0)
MCV: 75.6 fL — ABNORMAL LOW (ref 80.0–100.0)
MCV: 79.3 fL — ABNORMAL LOW (ref 80.0–100.0)
MCV: 79.8 fL — ABNORMAL LOW (ref 80.0–100.0)
Platelets: 271 10*3/uL (ref 150–400)
Platelets: 248 10*3/uL (ref 150–400)
Platelets: 262 10*3/uL (ref 150–400)
Platelets: 260 10*3/uL (ref 150–400)
RBC: 2.92 MIL/uL — ABNORMAL LOW (ref 3.87–5.11)
RBC: 2.87 MIL/uL — ABNORMAL LOW (ref 3.87–5.11)
RBC: 3.58 MIL/uL — ABNORMAL LOW (ref 3.87–5.11)
RBC: 3.56 MIL/uL — ABNORMAL LOW (ref 3.87–5.11)
RDW: 18.8 % — ABNORMAL HIGH (ref 11.5–15.5)
RDW: 18.7 % — ABNORMAL HIGH (ref 11.5–15.5)
RDW: 17.9 % — ABNORMAL HIGH (ref 11.5–15.5)
RDW: 17.9 % — ABNORMAL HIGH (ref 11.5–15.5)
WBC: 5 10*3/uL (ref 4.0–10.5)
WBC: 4.8 10*3/uL (ref 4.0–10.5)
WBC: 5.9 10*3/uL (ref 4.0–10.5)
WBC: 7 10*3/uL (ref 4.0–10.5)
nRBC: 0 % (ref 0.0–0.2)
nRBC: 0 % (ref 0.0–0.2)
nRBC: 0.3 % — ABNORMAL HIGH (ref 0.0–0.2)
nRBC: 0 % (ref 0.0–0.2)

## 2023-04-11 LAB — BASIC METABOLIC PANEL
Anion gap: 3 — ABNORMAL LOW (ref 5–15)
BUN: 11 mg/dL (ref 8–23)
CO2: 23 mmol/L (ref 22–32)
Calcium: 8.1 mg/dL — ABNORMAL LOW (ref 8.9–10.3)
Chloride: 113 mmol/L — ABNORMAL HIGH (ref 98–111)
Creatinine, Ser: 0.89 mg/dL (ref 0.44–1.00)
GFR, Estimated: >60 mL/min (ref >60)
Glucose, Bld: 92 mg/dL (ref 70–99)
Potassium: 4.2 mmol/L (ref 3.5–5.1)
Sodium: 139 mmol/L (ref 135–145)

## 2023-04-11 LAB — VITAMIN B12: Vitamin B-12: 592 pg/mL (ref 180–914)

## 2023-04-11 LAB — PREPARE RBC (CROSSMATCH)

## 2023-04-11 MED ORDER — SODIUM CHLORIDE 0.9 % IV SOLN: INTRAVENOUS | Status: DC

## 2023-04-11 MED ORDER — SODIUM CHLORIDE 0.9 % IV SOLN
300.0000 mg | Freq: Once | INTRAVENOUS | Status: AC
  Administered 2023-04-11: 300 mg via INTRAVENOUS
  Filled 2023-04-11: qty 300

## 2023-04-11 MED ORDER — PEG 3350-KCL-NA BICARB-NACL 420 G PO SOLR
4000.0000 mL | Freq: Once | ORAL | Status: AC
  Administered 2023-04-11: 4000 mL via ORAL
  Filled 2023-04-11: qty 4000

## 2023-04-11 MED ORDER — SODIUM CHLORIDE 0.9% IV SOLUTION: Freq: Once | INTRAVENOUS | Status: DC

## 2023-04-11 NOTE — Progress Notes (Signed)
Transition of Care [facility]) - Inpatient Brief Assessment   Patient Details  Name: Andrea Kennedy MRN: [id number] Date of Birth: 04-Oct-1945  Transition of Care [facility]) CM/SW Contact:    Truddie Hidden, RN Phone Number: 04/11/2023, 2:00 PM   Clinical Narrative: TOC continuing ongoing assessments for needs that may arise and changes in discharge plan.   Transition of Care Asessment: Insurance and Status: Insurance coverage has been reviewed Patient has primary care physician: Yes Home environment has been reviewed: Return to home Prior level of function:: Independent Prior/Current Home Services: No current home services Social Determinants of Health Reivew: SDOH reviewed no interventions necessary Readmission risk has been reviewed: Yes Transition of care needs: no transition of care needs at this time

## 2023-04-11 NOTE — Progress Notes (Addendum)
  Progress Note   Patient: Andrea Kennedy ZDG:[id number] DOB: 1946/08/11 DOA: 04/10/2023     0 DOS: the patient was seen and examined on 04/11/2023   Brief hospital course: Andrea Kennedy is a 76 y.o. female with medical history significant of A fib on Eliquis (last dose was this morning), HTN, HLD, stroke with right-sided weakness and slurred speech, GERD, depression with anxiety, CKD stage IIIa, iron deficiency anemia, lung cancer, sciatic, lupus, who presents with weakness and dark stool.  Her hemoglobin was as low as 5.6, she received a blood transfusion last night, but additional blood transfusion the morning of 8/27, also given IV iron.  GI has seen the patient, planning for EGD 8/28   Principal Problem:   Symptomatic anemia Active Problems:   Iron deficiency anemia   B12 deficiency   Gastroesophageal reflux disease with esophagitis   Chronic atrial fibrillation with RVR (HCC)   Benign essential HTN   Stroke (cerebrum) (HCC) - L frontal embolic s/p IV tPA, d/t AF   Chronic kidney disease, stage 3a (HCC)   Depression with anxiety   Cocaine abuse (HCC)   Tobacco abuse   Acute blood loss anemia   Assessment and Plan: Acute upper GI bleed. Acute blood loss anemia. Iron deficient anemia. B12 deficient anemia. Patient has a black stool when she came to the hospital, she is placed on IV Protonix, EGD is planned for tomorrow per GI. Hemoglobin is 8.7 after initial transfusion, receiving another units of PRBC, also given IV iron for iron deficient anemia.  She is chronic taking B12. Continue follow hemoglobin, transfuse as needed.  Essential hypertension. History of stroke with right hemiparesis. Chronic atrial fibrillation with RVR. Continue monitor blood pressure, hold Eliquis due to acute GI bleed.  Heart rate is much better.  Chronic kidney disease stage IIIa. Renal function still stable  History of cocaine abuse and tobacco abuse. Advised to quit.    Subjective:   Patient still has black stools, no dizziness.  No shortness of breath.  Physical Exam: Vitals:   04/11/23 0946 04/11/23 1142 04/11/23 1215 04/11/23 1258  BP: 124/76 (!) 106/54 116/60 120/62  Pulse: 82 89 84 88  Resp: 16 20 18 20   Temp: 98.6 F (37 C) 98.5 F (36.9 C) 98.7 F (37.1 C) 98.8 F (37.1 C)  TempSrc: Oral Oral Oral Oral  SpO2: 94% 92% 95% 94%  Weight:      Height:       General exam: Appears calm and comfortable  Respiratory system: Clear to auscultation. Respiratory effort normal. Cardiovascular system: S1 & S2 heard, RRR. No JVD, murmurs, rubs, gallops or clicks. No pedal edema. Gastrointestinal system: Abdomen is nondistended, soft and nontender. No organomegaly or masses felt. Normal bowel sounds heard. Central nervous system: Alert and oriented x3.  Right hemiparesis. Extremities: Symmetric 5 x 5 power. Skin: No rashes, lesions or ulcers Psychiatry: Judgement and insight appear normal. Mood & affect appropriate.    Data Reviewed:  Lab results reviewed.  Family Communication: None  Disposition: Status is: Inpatient Remains inpatient appropriate because: Severity of disease, IV treatment.  Inpatient procedure.     Time spent: 35 minutes  Author: Marrion Coy, MD 04/11/2023 1:52 PM  For on call review [email or web address].

## 2023-04-11 NOTE — Consult Note (Signed)
Midge Minium, MD [facility]  [address], [address] [place], [place] [address] Phone: [phone] Fax : [phone]  Consultation  Referring Provider:     Dr. Clyde Lundborg Primary Care Physician:  Gracelyn Nurse, MD Primary Gastroenterologist:  Dr. Norma Fredrickson         Reason for Consultation:     Symptomatic anemia  Date of Admission:  04/10/2023 Date of Consultation:  04/11/2023         HPI:   Andrea Kennedy is a 77 y.o. female who has a history of atrial fibrillation and hypertension and a stroke with right-sided weakness who is on Eliquis.  The patient also has a history of GERD, depression and anxiety.  The patient has been noted to have iron deficiency anemia and takes iron supplements.  The patient had been admitted with generalized weakness recently which she reported to be getting worse.  She has had black stools but states that that has been since she started taking iron supplementation.  Is no report of any other GI symptoms on admission but she now reports that she has some mid abdominal pain which she reports to be burning pain.  The patient's labs have shown:  Component     Latest Ref Rng 04/08/2021 04/09/2021 04/10/2023 04/11/2023  Hemoglobin     12.0 - 15.0 g/dL 69.6  29.5 (L)  5.6 (L)  6.7 (L)   Hemoglobin        6.1 (L)  6.8 (L)   HCT     36.0 - 46.0 % 38.2  35.6 (L)  19.8 (L)  21.7 (L)   HCT        21.6 (L)  22.2 (L)    There is no report of any nausea vomiting fevers or chills.  The patient's ferritin was 6 with the iron saturation of 2 and an iron of 12 despite taking iron.  Past Medical History:  Diagnosis Date   Hyperlipidemia    Hypertension    Lupus (HCC)    Sciatica of right side    Stroke (HCC)    Tobacco abuse     Past Surgical History:  Procedure Laterality Date   REVISION TOTAL HIP ARTHROPLASTY Right    TOTAL HIP ARTHROPLASTY Right 06/03/2015   Procedure: RIGHT TOTAL HIP ARTHROPLASTY ANTERIOR APPROACH;  Surgeon: Ollen Gross, MD;  Location: [facility];   Service: Orthopedics;  Laterality: Right;   TUBAL LIGATION     VIDEO BRONCHOSCOPY WITH ENDOBRONCHIAL NAVIGATION N/A 05/12/2021   Procedure: VIDEO BRONCHOSCOPY WITH ENDOBRONCHIAL NAVIGATION;  Surgeon: Vida Rigger, MD;  Location: [facility];  Service: Thoracic;  Laterality: N/A;   VIDEO BRONCHOSCOPY WITH ENDOBRONCHIAL ULTRASOUND N/A 05/12/2021   Procedure: VIDEO BRONCHOSCOPY WITH ENDOBRONCHIAL ULTRASOUND;  Surgeon: Vida Rigger, MD;  Location: [facility];  Service: Thoracic;  Laterality: N/A;    Prior to Admission medications   Medication Sig Start Date End Date Taking? Authorizing Provider  apixaban (ELIQUIS) 5 MG TABS tablet Take 1 tablet (5 mg total) by mouth 2 (two) times daily. 05/09/17  Yes Veryl Speak, FNP  atorvastatin (LIPITOR) 40 MG tablet Take 1 tablet (40 mg total) by mouth daily at 6 PM. 04/10/21  Yes Enedina Finner, MD  B Complex-C (B-COMPLEX WITH VITAMIN C) tablet Take 1 tablet by mouth daily with breakfast.   Yes [provider]  cholecalciferol (VITAMIN D) 25 MCG tablet Take 1 tablet (1,000 Units total) by mouth daily. Patient taking differently: Take 1,000 Units by mouth once  a week. 04/11/21  Yes Enedina Finner, MD  diltiazem (CARDIZEM CD) 120 MG 24 hr capsule Take 1 capsule (120 mg total) by mouth daily. 04/10/21  Yes Enedina Finner, MD  methocarbamol (ROBAXIN) 500 MG tablet Take 1 tablet (500 mg total) by mouth every 6 (six) hours as needed for muscle spasms. 08/03/16  Yes Angiulli, Mcarthur Rossetti, PA-C  acetaminophen (TYLENOL) 325 MG tablet Take 650 mg by mouth every 6 (six) hours as needed (for headache/pain). Patient not taking: Reported on 04/10/2023    [provider]  docusate sodium (COLACE) 100 MG capsule Take 100-200 mg by mouth at bedtime.    [provider]  famotidine (PEPCID) 20 MG tablet Take 20 mg by mouth 2 (two) times daily as needed for indigestion.    [provider]  Ferrous Sulfate Dried (HM SLOW RELEASE IRON) 45 MG TBCR Take 45 mg  by mouth in the morning.    [provider]  HYDROcodone-acetaminophen (NORCO) 7.5-325 MG tablet Take 1 tablet by mouth every 6 (six) hours as needed (pain.). 04/16/21   [provider]  hydrOXYzine (ATARAX/VISTARIL) 25 MG tablet Take 25 mg by mouth 3 (three) times daily as needed for anxiety. 03/17/21   [provider]  Multiple Vitamin (MULTIVITAMIN WITH MINERALS) TABS tablet Take 1 tablet by mouth daily. Centrum Silver    [provider]  nicotine (NICODERM CQ - DOSED IN MG/24 HOURS) 14 mg/24hr patch Place 1 patch (14 mg total) onto the skin daily. 04/11/21   Enedina Finner, MD  nortriptyline (PAMELOR) 25 MG capsule Take 25 mg by mouth at bedtime. Patient not taking: Reported on 04/04/2022 04/16/21   [provider]    Family History  Problem Relation Age of Onset   Breast cancer Mother    Diabetes Mother    Heart attack Father    Fibromyalgia Sister      Social History   Tobacco Use   Smoking status: Some Days    Current packs/day: 0.25    Types: Cigarettes   Smokeless tobacco: Never  Substance Use Topics   Alcohol use: Yes    Comment: Once in a while.    Drug use: Never    Allergies as of 04/10/2023 - Review Complete 04/10/2023  Allergen Reaction Noted   Hydroxychloroquine Other (See Comments) 05/12/2021   Floxin [ofloxacin] Other (See Comments) 06/03/2015   Levaquin [levofloxacin in d5w] Other (See Comments) 03/10/2018    Review of Systems:    All systems reviewed and negative except where noted in HPI.   Physical Exam:  Vital signs in last 24 hours: Temp:  [97.6 F (36.4 C)-98.8 F (37.1 C)] 98.5 F (36.9 C) (08/27 1142) Pulse Rate:  [51-125] 89 (08/27 1142) Resp:  [16-20] 20 (08/27 1142) BP: (100-125)/(54-96) 106/54 (08/27 1142) SpO2:  [92 %-100 %] 92 % (08/27 1142) Weight:  [63.5 kg] 63.5 kg (08/26 1557) Last BM Date : 04/10/23 General:   Pleasant, cooperative in NAD Head:  Normocephalic and atraumatic. Eyes:   No  icterus.   Conjunctiva pink. PERRLA. Ears:  Normal auditory acuity. Neck:  Supple; no masses or thyroidomegaly Lungs: Respirations even and unlabored. Lungs clear to auscultation bilaterally.   No wheezes, crackles, or rhonchi.  Heart:  Regular rate and rhythm;  Without murmur, clicks, rubs or gallops Abdomen:  Soft, nondistended, nontender. Normal bowel sounds. No appreciable masses or hepatomegaly.  No rebound or guarding.  Rectal:  Not performed. Msk:  Symmetrical without gross deformities.   Extremities:  Without edema, cyanosis or clubbing. Neurologic:  Alert and oriented x3; slurred speech right-sided weakness. Skin:  Intact without significant lesions or rashes. Cervical Nodes:  No significant cervical adenopathy. Psych:  Alert and cooperative. Normal affect.  LAB RESULTS: Recent Labs    04/10/23 2030 04/11/23 0359 04/11/23 0658  WBC 6.1 5.0 4.8  HGB 5.6* 6.8* 6.7*  HCT 19.8* 22.2* 21.7*  PLT 305 271 248   BMET Recent Labs    04/10/23 1559 04/11/23 0658  NA 138 139  K 4.4 4.2  CL 105 113*  CO2 23 23  GLUCOSE 120* 92  BUN 19 11  CREATININE 1.05* 0.89  CALCIUM 9.1 8.1*   LFT No results for input(s): "PROT", "ALBUMIN", "AST", "ALT", "ALKPHOS", "BILITOT", "BILIDIR", "IBILI" in the last 72 hours. PT/INR Recent Labs    04/10/23 2030  LABPROT 15.6*  INR 1.2    STUDIES: No results found.    Impression / Plan:   Assessment: Principal Problem:   Symptomatic anemia Active Problems:   Stroke (cerebrum) (HCC) - L frontal embolic s/p IV tPA, d/t AF   Cocaine abuse (HCC)   Benign essential HTN   Tobacco abuse   Gastroesophageal reflux disease with esophagitis   Iron deficiency anemia   Chronic atrial fibrillation with RVR (HCC)   Chronic kidney disease, stage 3a (HCC)   Depression with anxiety   B12 deficiency   Andrea Kennedy is a 77 y.o. y/o female with who was admitted with generalized weakness.  The patient has a iron saturation of 2 and a ferritin  of 6 and the patient on iron.  The patient has been started on IV iron and a GI consult was called for her iron deficiency anemia.  The patient reports that her last colonoscopy was many years ago and she does not recall ever having an upper endoscopy.  Plan:  The plan for this patient would be to start a GI workup with an EGD and colonoscopy.  The patient has been explained this and agrees to undergoing these procedures.  The patient will be prepped today and kept n.p.o. after midnight for a EGD and colonoscopy for tomorrow.  Thank you for involving me in the care of this patient.      LOS: 0 days   Midge Minium, MD, [facility] 04/11/2023, 11:54 AM,  Pager [phone] 7am-5pm  Check AMION for 5pm -7am coverage and on weekends   Note: This dictation was prepared with Dragon dictation along with smaller phrase technology. Any transcriptional errors that result from this process are unintentional.

## 2023-04-11 NOTE — Hospital Course (Addendum)
77 y.o. female with medical history significant of A fib on Eliquis (last dose was this morning), HTN, HLD, stroke with right-sided weakness and slurred speech, GERD, depression with anxiety, CKD stage IIIa, iron deficiency anemia, lung cancer, sciatic, lupus, who presents with weakness and dark stool.  Her hemoglobin was as low as 5.6, she received a blood transfusion last night, but additional blood transfusion the morning of 8/27, also given IV iron.  EGD and colonoscopy pushed off to 8/29 since the patient was not cleaned out with the bowel prep.  8/29.  Hemoglobin dropped down to 7.1.  Will give a unit of packed red blood cells.  EGD showing nonbleeding gastric ulcer.  2 polyps removed with colonoscopy. 8/30.  This morning patient with a temperature of 100 feels congested and a cough.  Patient dropped pulse ox with ambulation and was tachycardic this morning.  COVID test negative.  Viral respiratory panel pending.

## 2023-04-11 NOTE — Plan of Care (Signed)

## 2023-04-12 DIAGNOSIS — D62 Acute posthemorrhagic anemia: Secondary | ICD-10-CM | POA: Diagnosis not present

## 2023-04-12 DIAGNOSIS — D508 Other iron deficiency anemias: Secondary | ICD-10-CM

## 2023-04-12 DIAGNOSIS — I482 Chronic atrial fibrillation, unspecified: Secondary | ICD-10-CM | POA: Diagnosis not present

## 2023-04-12 DIAGNOSIS — E538 Deficiency of other specified B group vitamins: Secondary | ICD-10-CM

## 2023-04-12 DIAGNOSIS — I10 Essential (primary) hypertension: Secondary | ICD-10-CM | POA: Diagnosis not present

## 2023-04-12 LAB — BASIC METABOLIC PANEL
Anion gap: 4 — ABNORMAL LOW (ref 5–15)
BUN: 8 mg/dL (ref 8–23)
CO2: 22 mmol/L (ref 22–32)
Calcium: 8.2 mg/dL — ABNORMAL LOW (ref 8.9–10.3)
Chloride: 114 mmol/L — ABNORMAL HIGH (ref 98–111)
Creatinine, Ser: 0.87 mg/dL (ref 0.44–1.00)
GFR, Estimated: >60 mL/min (ref >60)
Glucose, Bld: 93 mg/dL (ref 70–99)
Potassium: 3.9 mmol/L (ref 3.5–5.1)
Sodium: 140 mmol/L (ref 135–145)

## 2023-04-12 LAB — GLUCOSE, CAPILLARY: Glucose-Capillary: 101 mg/dL — ABNORMAL HIGH (ref 70–99)

## 2023-04-12 LAB — MAGNESIUM: Magnesium: 2.1 mg/dL (ref 1.7–2.4)

## 2023-04-12 MED ORDER — ESCITALOPRAM OXALATE 10 MG PO TABS
10.0000 mg | ORAL_TABLET | Freq: Every day | ORAL | Status: DC
  Administered 2023-04-12 – 2023-04-16 (×5): 10 mg via ORAL
  Filled 2023-04-12 (×5): qty 1

## 2023-04-12 NOTE — Assessment & Plan Note (Addendum)
Creatinine 0.87 with a GFR greater than 60

## 2023-04-12 NOTE — Assessment & Plan Note (Addendum)
Ferritin very low at 6 going along with iron deficiency anemia.  Received IV iron.  Consider iron infusions as outpatient.

## 2023-04-12 NOTE — Evaluation (Signed)
Physical Therapy Evaluation Patient Details Name: Andrea Kennedy MRN: [id number] DOB: July 15, 1946 Today's Date: 04/12/2023  History of Present Illness  Andrea Kennedy is a 77 y.o. female with medical history significant of A fib on Eliquis (last dose was this morning), HTN, HLD, stroke with right-sided weakness and slurred speech, GERD, depression with anxiety, CKD stage IIIa, iron deficiency anemia, lung cancer, sciatic, lupus, who presents with weakness and dark stool.   Clinical Impression  Patient received in bed talking on phone. She is agreeable to PT assessment. Patient reports sob at baseline, but is not on home O2. Here she is on 2 liters oxygen via [place]. She is mod I with bed mobility. Transfers with min A. Patient is able to ambulate out into hallway with hand held assist or holding railing in hallway. She ambulated on room air and upon return to room her O2 saturations were 80%. She will continue to benefit from skilled PT to improve endurance and safety with mobility.        If plan is discharge home, recommend the following: A little help with walking and/or transfers;A little help with bathing/dressing/bathroom;Assist for transportation;Help with stairs or ramp for entrance;Assistance with cooking/housework   Can travel by private vehicle        Equipment Recommendations None recommended by PT  Recommendations for Other Services       Functional Status Assessment Patient has had a recent decline in their functional status and demonstrates the ability to make significant improvements in function in a reasonable and predictable amount of time.     Precautions / Restrictions Precautions Precautions: Fall Restrictions Weight Bearing Restrictions: No      Mobility  Bed Mobility Overal bed mobility: Modified Independent                  Transfers Overall transfer level: Needs assistance Equipment used: 1 person hand held assist                     Ambulation/Gait Ambulation/Gait assistance: Min assist Gait Distance (Feet): 60 Feet Assistive device: 1 person hand held assist Gait Pattern/deviations: Step-through pattern, Decreased step length - right, Decreased step length - left, Decreased stride length Gait velocity: decr     General Gait Details: patient ambulated out into hall with single UE support, hand held or holding to rail. ( no walker in room at time of evaluation) Patient received on 2 liters O2, but reports she does not have at home. O2 sats dropped to 80% with ambulation short distance on room air.  Stairs            Wheelchair Mobility     Tilt Bed    Modified Rankin (Stroke Patients Only)       Balance Overall balance assessment: Needs assistance Sitting-balance support: Feet supported Sitting balance-Leahy Scale: Good     Standing balance support: Single extremity supported, During functional activity, Reliant on assistive device for balance Standing balance-Leahy Scale: Fair                               Pertinent Vitals/Pain Pain Assessment Pain Assessment: No/denies pain    Home Living Family/patient expects to be discharged to:: Private residence Living Arrangements: Spouse/significant other Available Help at Discharge: Family;Available 24 hours/day Type of Home: Apartment Home Access: Level entry       Home Layout: One level Home Equipment: Rollator (4 wheels);Shower seat  Prior Function Prior Level of Function : Needs assist             Mobility Comments: ambulates with Rollator at baseline ADLs Comments: husband assists with bathing in shower and as needed     Extremity/Trunk Assessment   Upper Extremity Assessment Upper Extremity Assessment: RUE deficits/detail RUE Deficits / Details: prior CVA RUE Coordination: decreased gross motor    Lower Extremity Assessment Lower Extremity Assessment: RLE deficits/detail RLE Deficits / Details: Prior  CVA RLE Coordination: decreased gross motor    Cervical / Trunk Assessment Cervical / Trunk Assessment: Normal  Communication   Communication Communication: Other (comment) (slurred speech from prior CVA) Cueing Techniques: Verbal cues  Cognition Arousal: Alert Behavior During Therapy: WFL for tasks assessed/performed Overall Cognitive Status: Within Functional Limits for tasks assessed                                          General Comments      Exercises     Assessment/Plan    PT Assessment Patient needs continued PT services  PT Problem List Decreased strength;Decreased range of motion;Decreased activity tolerance;Decreased balance;Decreased mobility;Cardiopulmonary status limiting activity       PT Treatment Interventions DME instruction;Gait training;Stair training;Functional mobility training;Therapeutic activities;Therapeutic exercise;Patient/family education    PT Goals (Current goals can be found in the Care Plan section)  Acute Rehab PT Goals Patient Stated Goal: to return home PT Goal Formulation: With patient Time For Goal Achievement: 04/26/23 Potential to Achieve Goals: Good    Frequency Min 1X/week     Co-evaluation               AM-PAC PT "6 Clicks" Mobility  Outcome Measure Help needed turning from your back to your side while in a flat bed without using bedrails?: None Help needed moving from lying on your back to sitting on the side of a flat bed without using bedrails?: None Help needed moving to and from a bed to a chair (including a wheelchair)?: A Little Help needed standing up from a chair using your arms (e.g., wheelchair or bedside chair)?: A Little Help needed to walk in hospital room?: A Little Help needed climbing 3-5 steps with a railing? : A Little 6 Click Score: 20    End of Session Equipment Utilized During Treatment: Oxygen Activity Tolerance: Patient tolerated treatment well Patient left: in bed;with  call bell/phone within reach;with bed alarm set Nurse Communication: Mobility status PT Visit Diagnosis: Other abnormalities of gait and mobility (R26.89);Muscle weakness (generalized) (M62.81);Difficulty in walking, not elsewhere classified (R26.2);Hemiplegia and hemiparesis Hemiplegia - Right/Left: Right Hemiplegia - dominant/non-dominant: Dominant Hemiplegia - caused by: Cerebral infarction    Time: 0927-0942 PT Time Calculation (min) (ACUTE ONLY): 15 min   Charges:   PT Evaluation $PT Eval Moderate Complexity: 1 Mod   PT General Charges $$ ACUTE PT VISIT: 1 Visit         Konstantinos Cordoba, PT, [facility] 04/12/23,11:15 AM

## 2023-04-12 NOTE — Assessment & Plan Note (Addendum)
Previous history of stroke with right-sided weakness.  Continue Eliquis.

## 2023-04-12 NOTE — Assessment & Plan Note (Addendum)
With fast heart rate this morning I increase Cardizem CD to 240 mg daily but will go back to 120 mg tomorrow.  Restarted Eliquis this morning.

## 2023-04-12 NOTE — Assessment & Plan Note (Signed)
Continue Lexapro

## 2023-04-12 NOTE — Assessment & Plan Note (Signed)
On Cardizem CD

## 2023-04-12 NOTE — Progress Notes (Signed)
I was informed that the patient did not drink the prep and is not cleaned out.  The patient will be put on a clear liquid diet and continue to take the prep and her EGD and colonoscopy will be postponed until tomorrow.

## 2023-04-12 NOTE — Anesthesia Preprocedure Evaluation (Addendum)
Anesthesia Evaluation  Patient identified by MRN, date of birth, ID band Patient awake    Reviewed: Allergy & Precautions, NPO status , Patient's Chart, lab work & pertinent test results  Airway Mallampati: III  TM Distance: >3 FB Neck ROM: full    Dental  (+) Chipped, Dental Advidsory Given   Pulmonary neg pulmonary ROS, Current Smoker and Patient abstained from smoking. Lung adenocarcinoma   Pulmonary exam normal        Cardiovascular hypertension, negative cardio ROS Normal cardiovascular exam+ dysrhythmias (a fib on Eliquis)   ECG 04/10/23: A fib with RVR (HR 120); no STEMI   Neuro/Psych  PSYCHIATRIC DISORDERS Anxiety Depression    Cocaine use CVA (residual right-sided weakness and slurred speech) negative neurological ROS  negative psych ROS   GI/Hepatic negative GI ROS, Neg liver ROS,,,  Endo/Other  negative endocrine ROS    Renal/GU negative Renal ROS  negative genitourinary   Musculoskeletal Lupus    Abdominal   Peds  Hematology negative hematology ROS (+) Blood dyscrasia, anemia   Anesthesia Other Findings Past Medical History: No date: Hyperlipidemia No date: Hypertension No date: Lupus (HCC) No date: Sciatica of right side No date: Stroke (HCC) No date: Tobacco abuse  Past Surgical History: No date: REVISION TOTAL HIP ARTHROPLASTY; Right 06/03/2015: TOTAL HIP ARTHROPLASTY; Right     Comment:  Procedure: RIGHT TOTAL HIP ARTHROPLASTY ANTERIOR               APPROACH;  Surgeon: Ollen Gross, MD;  Location: [facility];              Service: Orthopedics;  Laterality: Right; No date: TUBAL LIGATION 05/12/2021: VIDEO BRONCHOSCOPY WITH ENDOBRONCHIAL NAVIGATION; N/A     Comment:  Procedure: VIDEO BRONCHOSCOPY WITH ENDOBRONCHIAL               NAVIGATION;  Surgeon: Vida Rigger, MD;  Location:               [facility];  Service: Thoracic;  Laterality: N/A; 05/12/2021: VIDEO BRONCHOSCOPY WITH ENDOBRONCHIAL  ULTRASOUND; N/A     Comment:  Procedure: VIDEO BRONCHOSCOPY WITH ENDOBRONCHIAL               ULTRASOUND;  Surgeon: Vida Rigger, MD;  Location:               [facility];  Service: Thoracic;  Laterality: N/A;  BMI    Body Mass Index: 21.29 kg/m      Reproductive/Obstetrics negative OB ROS                             Anesthesia Physical Anesthesia Plan  ASA: 4  Anesthesia Plan: General   Post-op Pain Management: Minimal or no pain anticipated   Induction: Intravenous  PONV Risk Score and Plan: 2 and Propofol infusion, TIVA and Treatment may vary due to age or medical condition  Airway Management Planned: Natural Airway  Additional Equipment: None  Intra-op Plan:   Post-operative Plan:   Informed Consent: I have reviewed the patients History and Physical, chart, labs and discussed the procedure including the risks, benefits and alternatives for the proposed anesthesia with the patient or authorized representative who has indicated his/her understanding and acceptance.     Dental advisory given  Plan Discussed with: CRNA and Surgeon  Anesthesia Plan Comments: (Discussed risks of anesthesia with patient, including possibility of difficulty with spontaneous ventilation under anesthesia necessitating airway intervention, PONV, and rare risks  such as cardiac or respiratory or neurological events, and allergic reactions. Discussed the role of CRNA in patient's perioperative care. Patient understands.)        Anesthesia Quick Evaluation

## 2023-04-12 NOTE — Assessment & Plan Note (Signed)
B12 level 592.

## 2023-04-12 NOTE — Assessment & Plan Note (Addendum)
Hemoglobin as low as 5.6 on the day of admission.  Patient received 3 units of packed red blood cells during hospital course.  Last hemoglobin 9.4.   EGD showing a nonbleeding gastric ulcer.  Protonix prescribed.  2 polyps removed on colonoscopy.  Continue Eliquis.  Can consider capsule endoscopy as outpatient if anemia persists or if bleeding reoccurs.

## 2023-04-12 NOTE — Plan of Care (Signed)

## 2023-04-12 NOTE — Progress Notes (Signed)
Progress Note   Patient: Andrea Kennedy [id number]:[id number] DOB: 1945-09-03 DOA: 04/10/2023     1 DOS: the patient was seen and examined on 04/12/2023   Brief hospital course: 77 y.o. female with medical history significant of A fib on Eliquis (last dose was this morning), HTN, HLD, stroke with right-sided weakness and slurred speech, GERD, depression with anxiety, CKD stage IIIa, iron deficiency anemia, lung cancer, sciatic, lupus, who presents with weakness and dark stool.  Her hemoglobin was as low as 5.6, she received a blood transfusion last night, but additional blood transfusion the morning of 8/27, also given IV iron.  EGD and colonoscopy pushed off to 8/29 since the patient was not cleaned out with the bowel prep.    Assessment and Plan: * Acute blood loss anemia Hemoglobin as low as 5.6 on the day of admission.  Patient received 2 units of packed red blood cells during hospital course.  Last hemoglobin 8.6.  Holding Eliquis.  Suspect GI bleed.  EGD and colonoscopy got pushed off till tomorrow secondary to colonoscopy prep not cleaning her out.  Iron deficiency anemia Ferritin very low at 6 going along with iron deficiency anemia.  Received IV iron.  Chronic atrial fibrillation with RVR (HCC) Holding Eliquis.  On Cardizem CD.  Benign essential HTN On Cardizem CD.  Stroke (cerebrum) (HCC) - L frontal embolic s/p IV tPA, d/t AF Previous history of stroke with right-sided weakness.  Holding Eliquis currently with severe anemia.  Chronic kidney disease, stage 3a (HCC) Creatinine 0.87 with a GFR greater than 60  Depression with anxiety Start Lexapro  B12 deficiency B12 level 592.        Subjective: Patient complains of anxiety and depression and wanted to try something for depression.  Takes Eliquis at home.  States that she has not seen any bleeding but did have a nosebleed last week.  Physical Exam: Vitals:   04/12/23 0431 04/12/23 0800 04/12/23 1141 04/12/23 1553  BP:  131/88 130/60 119/63 (!) 104/44  Pulse: 95 92 (!) 101 89  Resp: 16 18 16 18   Temp: 98 F (36.7 C) 99.4 F (37.4 C) 98.7 F (37.1 C) 98.4 F (36.9 C)  TempSrc: Oral Oral Oral Oral  SpO2: 91% 98% 96% 93%  Weight:      Height:       Physical Exam HENT:     Head: Normocephalic.     Mouth/Throat:     Pharynx: No oropharyngeal exudate.  Eyes:     General: Lids are normal.     Conjunctiva/sclera: Conjunctivae normal.  Cardiovascular:     Rate and Rhythm: Normal rate and regular rhythm.     Heart sounds: Normal heart sounds, S1 normal and S2 normal.  Pulmonary:     Breath sounds: No decreased breath sounds, wheezing, rhonchi or rales.  Abdominal:     Palpations: Abdomen is soft.     Tenderness: There is no abdominal tenderness.  Musculoskeletal:     Right lower leg: No swelling.     Left lower leg: No swelling.  Skin:    General: Skin is warm.     Findings: No rash.  Neurological:     Mental Status: She is alert and oriented to person, place, and time.     Comments: Chronic slurred speech and facial droop secondary to previous stroke.  Chronic right-sided weakness.     Data Reviewed: Creatinine 0.87, last hemoglobin 8.6  Family Communication: Unable to reach spouse  Disposition: Status is: Inpatient  Remains inpatient appropriate because: EGD and colonoscopy got pushed off until tomorrow secondary to colonoscopy prep not cleaning route completely.  Planned Discharge Destination: Home    Time spent: 28 minutes  Author: Alford Highland, MD 04/12/2023 5:23 PM  For on call review [email or web address].
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
## 2023-04-13 ENCOUNTER — Inpatient Hospital Stay: Payer: 59 | Admitting: Anesthesiology

## 2023-04-13 ENCOUNTER — Encounter
Admission: EM | Disposition: A | Discharge status: Home Health | Payer: Self-pay | Source: Home / Self Care | Attending: Internal Medicine

## 2023-04-13 ENCOUNTER — Encounter: Payer: Self-pay | Admitting: Internal Medicine

## 2023-04-13 DIAGNOSIS — K253 Acute gastric ulcer without hemorrhage or perforation: Secondary | ICD-10-CM

## 2023-04-13 DIAGNOSIS — D508 Other iron deficiency anemias: Secondary | ICD-10-CM | POA: Diagnosis not present

## 2023-04-13 DIAGNOSIS — K635 Polyp of colon: Secondary | ICD-10-CM | POA: Diagnosis not present

## 2023-04-13 DIAGNOSIS — I482 Chronic atrial fibrillation, unspecified: Secondary | ICD-10-CM | POA: Diagnosis not present

## 2023-04-13 DIAGNOSIS — I10 Essential (primary) hypertension: Secondary | ICD-10-CM | POA: Diagnosis not present

## 2023-04-13 DIAGNOSIS — D62 Acute posthemorrhagic anemia: Secondary | ICD-10-CM | POA: Diagnosis not present

## 2023-04-13 HISTORY — PX: ESOPHAGOGASTRODUODENOSCOPY (EGD) WITH PROPOFOL: SHX5813

## 2023-04-13 HISTORY — PX: COLONOSCOPY WITH PROPOFOL: SHX5780

## 2023-04-13 HISTORY — PX: POLYPECTOMY: SHX5525

## 2023-04-13 LAB — BASIC METABOLIC PANEL
Anion gap: 6 (ref 5–15)
BUN: 7 mg/dL — ABNORMAL LOW (ref 8–23)
CO2: 25 mmol/L (ref 22–32)
Calcium: 8.4 mg/dL — ABNORMAL LOW (ref 8.9–10.3)
Chloride: 105 mmol/L (ref 98–111)
Creatinine, Ser: 0.89 mg/dL (ref 0.44–1.00)
GFR, Estimated: >60 mL/min (ref >60)
Glucose, Bld: 84 mg/dL (ref 70–99)
Potassium: 4 mmol/L (ref 3.5–5.1)
Sodium: 136 mmol/L (ref 135–145)

## 2023-04-13 LAB — HEMOGLOBIN: Hemoglobin: 7.9 g/dL — ABNORMAL LOW (ref 12.0–15.0)

## 2023-04-13 LAB — PREPARE RBC (CROSSMATCH)

## 2023-04-13 SURGERY — ESOPHAGOGASTRODUODENOSCOPY (EGD) WITH PROPOFOL
Anesthesia: General

## 2023-04-13 MED ORDER — SODIUM CHLORIDE 0.9% IV SOLUTION: Freq: Once | INTRAVENOUS | Status: AC

## 2023-04-13 MED ORDER — PROPOFOL 1000 MG/100ML IV EMUL
INTRAVENOUS | Status: AC
  Filled 2023-04-13: qty 100

## 2023-04-13 MED ORDER — LIDOCAINE HCL (CARDIAC) PF 100 MG/5ML IV SOSY
PREFILLED_SYRINGE | INTRAVENOUS | Status: DC | PRN
  Administered 2023-04-13: 50 mg via INTRAVENOUS

## 2023-04-13 MED ORDER — ONDANSETRON HCL 4 MG/2ML IJ SOLN
INTRAMUSCULAR | Status: DC | PRN
  Administered 2023-04-13: 4 mg via INTRAVENOUS

## 2023-04-13 MED ORDER — PROPOFOL 10 MG/ML IV BOLUS
INTRAVENOUS | Status: DC | PRN
  Administered 2023-04-13: 50 mg via INTRAVENOUS

## 2023-04-13 MED ORDER — PROPOFOL 10 MG/ML IV BOLUS
INTRAVENOUS | Status: AC
  Filled 2023-04-13: qty 20

## 2023-04-13 MED ORDER — PROPOFOL 500 MG/50ML IV EMUL
INTRAVENOUS | Status: DC | PRN
  Administered 2023-04-13: 150 ug/kg/min via INTRAVENOUS

## 2023-04-13 MED ORDER — PHENYLEPHRINE HCL (PRESSORS) 10 MG/ML IV SOLN
INTRAVENOUS | Status: DC | PRN
  Administered 2023-04-13 (×6): 80 ug via INTRAVENOUS
  Administered 2023-04-13: 160 ug via INTRAVENOUS

## 2023-04-13 MED ORDER — APIXABAN 5 MG PO TABS
5.0000 mg | ORAL_TABLET | Freq: Two times a day (BID) | ORAL | Status: DC
  Administered 2023-04-14 – 2023-04-16 (×5): 5 mg via ORAL
  Filled 2023-04-13 (×5): qty 1

## 2023-04-13 MED ORDER — APIXABAN 5 MG PO TABS: 5.0000 mg | ORAL_TABLET | Freq: Two times a day (BID) | ORAL | Status: DC

## 2023-04-13 NOTE — Op Note (Signed)
[facility] Gastroenterology Patient Name: Andrea Kennedy Procedure Date: 04/13/2023 12:18 PM MRN: [id number] Account #: [id number] Date of Birth: [birth year] Admit Type: Inpatient Age: 77 Room: [facility] ENDO ROOM 4 Gender: Female Note Status: Finalized Instrument Name: Upper Endoscope [id number] Procedure:             Upper GI endoscopy Indications:           Iron deficiency anemia Providers:             Midge Minium MD, MD Referring MD:          Gracelyn Nurse, MD (Referring MD) Medicines:             Propofol per Anesthesia Complications:         No immediate complications. Procedure:             Pre-Anesthesia Assessment:                        - Prior to the procedure, a History and Physical was                         performed, and patient medications and allergies were                         reviewed. The patient's tolerance of previous                         anesthesia was also reviewed. The risks and benefits                         of the procedure and the sedation options and risks                         were discussed with the patient. All questions were                         answered, and informed consent was obtained. Prior                         Anticoagulants: The patient has taken no anticoagulant                         or antiplatelet agents. ASA Grade Assessment: II - A                         patient with mild systemic disease. After reviewing                         the risks and benefits, the patient was deemed in                         satisfactory condition to undergo the procedure.                        After obtaining informed consent, the endoscope was                         passed under direct vision. Throughout the procedure,  the patient's blood pressure, pulse, and oxygen                         saturations were monitored continuously. The Endoscope                         was introduced through the mouth,  and advanced to the                         second part of duodenum. The upper GI endoscopy was                         accomplished without difficulty. The patient tolerated                         the procedure well. Findings:      The examined esophagus was normal.      One non-bleeding cratered gastric ulcer with no stigmata of bleeding was       found in the gastric antrum.      The examined duodenum was normal. Impression:            - Normal esophagus.                        - Non-bleeding gastric ulcer with no stigmata of                         bleeding.                        - Normal examined duodenum.                        - No specimens collected. Recommendation:        - Return patient to hospital ward for ongoing care.                        - Resume previous diet.                        - Continue present medications.                        - Perform a colonoscopy today. Procedure Code(s):     --- Professional ---                        [phone], Esophagogastroduodenoscopy, flexible,                         transoral; diagnostic, including collection of                         specimen(s) by brushing or washing, when performed                         (separate procedure) Diagnosis Code(s):     --- Professional ---                        D50.9, Iron deficiency anemia, unspecified  K25.9, Gastric ulcer, unspecified as acute or chronic,                         without hemorrhage or perforation CPT copyright 2022 American Medical Association. All rights reserved. The codes documented in this report are preliminary and upon coder review may  be revised to meet current compliance requirements. Midge Minium MD, MD 04/13/2023 62:95:28 PM This report has been signed electronically. Number of Addenda: 0 Note Initiated On: 04/13/2023 12:18 PM Estimated Blood Loss:  Estimated blood loss: none.      [facility]

## 2023-04-13 NOTE — Anesthesia Postprocedure Evaluation (Signed)
Anesthesia Post Note  Patient: Andrea Kennedy  Procedure(s) Performed: ESOPHAGOGASTRODUODENOSCOPY (EGD) WITH PROPOFOL COLONOSCOPY WITH PROPOFOL POLYPECTOMY  Patient location during evaluation: Endoscopy Anesthesia Type: General Level of consciousness: awake and alert Pain management: pain level controlled Vital Signs Assessment: post-procedure vital signs reviewed and stable Respiratory status: spontaneous breathing, nonlabored ventilation, respiratory function stable and patient connected to nasal cannula oxygen Cardiovascular status: blood pressure returned to baseline and stable Postop Assessment: no apparent nausea or vomiting Anesthetic complications: no  No notable events documented.   Last Vitals:  Vitals:   04/13/23 1305 04/13/23 1312  BP: 101/75 105/64  Pulse: 68 (!) 134  Resp: 19 14  Temp:    SpO2: 96% 97%    Last Pain:  Vitals:   04/13/23 1305  TempSrc:   PainSc: 0-No pain                 Stephanie Coup

## 2023-04-13 NOTE — Progress Notes (Signed)
Progress Note   Patient: Andrea Kennedy [id number]:[id number] DOB: [birth year] DOA: 04/10/2023     2 DOS: the patient was seen and examined on 04/13/2023   Brief hospital course: 77 y.o. female with medical history significant of A fib on Eliquis (last dose was this morning), HTN, HLD, stroke with right-sided weakness and slurred speech, GERD, depression with anxiety, CKD stage IIIa, iron deficiency anemia, lung cancer, sciatic, lupus, who presents with weakness and dark stool.  Her hemoglobin was as low as 5.6, she received a blood transfusion last night, but additional blood transfusion the morning of 8/27, also given IV iron.  EGD and colonoscopy pushed off to 8/29 since the patient was not cleaned out with the bowel prep.  8/29.  Hemoglobin dropped down to 7.1.  Will give a unit of packed red blood cells.  EGD showing nonbleeding gastric ulcer.  2 polyps removed with colonoscopy.   Assessment and Plan: * Acute blood loss anemia Hemoglobin as low as 5.6 on the day of admission.  Patient received 2 units of packed red blood cells during hospital course.  Last hemoglobin 7.1.  Will give a unit of blood today.  EGD showing a nonbleeding gastric ulcer.  2 polyps removed on colonoscopy.  Restart Eliquis tomorrow.  Iron deficiency anemia Ferritin very low at 6 going along with iron deficiency anemia.  Received IV iron.  Chronic atrial fibrillation with RVR (HCC) Restart Eliquis tomorrow morning.  On Cardizem CD.  Benign essential HTN On Cardizem CD.  Stroke (cerebrum) (HCC) - L frontal embolic s/p IV tPA, d/t AF Previous history of stroke with right-sided weakness.  Restart Eliquis tomorrow morning.  Chronic kidney disease, stage 3a (HCC) Creatinine 0.87 with a GFR greater than 60  Depression with anxiety Continue Lexapro  B12 deficiency B12 level 592.        Subjective: Patient seen this morning prior to colonoscopy.  Hemoglobin drifted down to 7.9.  Admitted with severe anemia.  Will  transfuse 1 unit of packed red blood cells today.  Physical Exam: Vitals:   04/13/23 1249 04/13/23 1305 04/13/23 1312 04/13/23 1349  BP: (!) 93/50 101/75 105/64 (!) 106/58  Pulse: 93 68 (!) 134 (!) 102  Resp: (!) 23 19 14    Temp: (!) 97.5 F (36.4 C)   98.4 F (36.9 C)  TempSrc: Temporal     SpO2:  96% 97% 94%  Weight:      Height:       Physical Exam HENT:     Head: Normocephalic.     Mouth/Throat:     Pharynx: No oropharyngeal exudate.  Eyes:     General: Lids are normal.     Conjunctiva/sclera: Conjunctivae normal.  Cardiovascular:     Rate and Rhythm: Normal rate. Rhythm irregularly irregular.     Heart sounds: Normal heart sounds, S1 normal and S2 normal.  Pulmonary:     Breath sounds: No decreased breath sounds, wheezing, rhonchi or rales.  Abdominal:     Palpations: Abdomen is soft.     Tenderness: There is no abdominal tenderness.  Musculoskeletal:     Right lower leg: No swelling.     Left lower leg: No swelling.  Skin:    General: Skin is warm.     Findings: No rash.  Neurological:     Mental Status: She is alert and oriented to person, place, and time.     Comments: Chronic slurred speech and facial droop secondary to previous stroke.  Chronic right-sided weakness.  Data Reviewed: Creatinine 0.89, hemoglobin 7.1  Disposition: Status is: Inpatient Remains inpatient appropriate because: Transfuse 1 unit of packed red blood cells today.  Restart Eliquis this evening.  Recheck hemoglobin tomorrow and watch for signs of bleeding.  Hopefully home tomorrow.  Planned Discharge Destination: Home with Home Health    Time spent: 28 minutes  Author: Alford Highland, MD 04/13/2023 2:46 PM  For on call review [email or web address].

## 2023-04-13 NOTE — Progress Notes (Signed)
The patient had an EGD and colonoscopy today with a healing gastric ulcer.  The patient's colonoscopy showed 2 polyps that were removed.  The patient had no blood anywhere throughout the GI tract as a sign of any acute GI bleeding.  The patient should follow-up with her regular GI doctors at [facility] clinic as an outpatient.  If the anemia persists then an outpatient capsule endoscopy may be contemplated.  Nothing further planned for this patient at this time.  I will sign off.  Please call if any further GI concerns or questions.  We would like to thank you for the opportunity to participate in the care of Andrea Kennedy.

## 2023-04-13 NOTE — Progress Notes (Signed)
PT Cancellation Note  Patient Details Name: Andrea Kennedy MRN: [id number] DOB: Nov 16, 1945   Cancelled Treatment:    Reason Eval/Treat Not Completed: Fatigue/lethargy limiting ability to participate Patient had EGD and colonoscopy this am and she is tired. Politely declines PT at this time. Will re-attempt tomorrow.   [name] 04/13/2023, 2:25 PM

## 2023-04-13 NOTE — Transfer of Care (Signed)
Immediate Anesthesia Transfer of Care Note  Patient: Andrea Kennedy  Procedure(s) Performed: ESOPHAGOGASTRODUODENOSCOPY (EGD) WITH PROPOFOL COLONOSCOPY WITH PROPOFOL POLYPECTOMY  Patient Location: PACU  Anesthesia Type:MAC  Level of Consciousness: awake and patient cooperative  Airway & Oxygen Therapy: Patient Spontanous Breathing and Patient connected to face mask oxygen  Post-op Assessment: Report given to RN and Post -op Vital signs reviewed and stable  Post vital signs: Reviewed and stable  Last Vitals:  Vitals Value Taken Time  BP 93/50 04/13/23 1249  Temp 36.4 C 04/13/23 1249  Pulse 93 04/13/23 1249  Resp 23 04/13/23 1249  SpO2      Last Pain:  Vitals:   04/13/23 1249  TempSrc: Temporal  PainSc: Asleep         Complications: No notable events documented.

## 2023-04-13 NOTE — Progress Notes (Signed)
PT Cancellation Note  Patient Details Name: Andrea Kennedy MRN: [id number] DOB: 1946/02/17   Cancelled Treatment:    Reason Eval/Treat Not Completed: Patient at procedure or test/unavailable Will re-attempt when patient available  [name] 04/13/2023, 11:10 AM

## 2023-04-13 NOTE — Op Note (Signed)
[facility] Gastroenterology Patient Name: Andrea Kennedy Procedure Date: 04/13/2023 12:16 PM MRN: [id number] Account #: [id number] Date of Birth: June 10, 1946 Admit Type: Inpatient Age: 77 Room: [facility] ENDO ROOM 4 Gender: Female Note Status: Finalized Instrument Name: Prentice Docker [id number] Procedure:             Colonoscopy Indications:           Iron deficiency anemia Providers:             Midge Minium MD, MD Referring MD:          Craig Guess, MD (Referring MD) Medicines:             Propofol per Anesthesia Complications:         No immediate complications. Procedure:             Pre-Anesthesia Assessment:                        - Prior to the procedure, a History and Physical was                         performed, and patient medications and allergies were                         reviewed. The patient's tolerance of previous                         anesthesia was also reviewed. The risks and benefits                         of the procedure and the sedation options and risks                         were discussed with the patient. All questions were                         answered, and informed consent was obtained. Prior                         Anticoagulants: The patient has taken no anticoagulant                         or antiplatelet agents. ASA Grade Assessment: II - A                         patient with mild systemic disease. After reviewing                         the risks and benefits, the patient was deemed in                         satisfactory condition to undergo the procedure.                        After obtaining informed consent, the colonoscope was                         passed under direct vision. Throughout the procedure,  the patient's blood pressure, pulse, and oxygen                         saturations were monitored continuously. The                         Colonoscope was introduced through the anus and                          advanced to the the cecum, identified by appendiceal                         orifice and ileocecal valve. The colonoscopy was                         performed without difficulty. The patient tolerated                         the procedure well. The quality of the bowel                         preparation was adequate to identify polyps. Findings:      The perianal and digital rectal examinations were normal.      A 6 mm polyp was found in the transverse colon. The polyp was sessile.       The polyp was removed with a cold snare. Resection and retrieval were       complete.      A 4 mm polyp was found in the ascending colon. The polyp was sessile.       The polyp was removed with a cold snare. Resection and retrieval were       complete.      Non-bleeding internal hemorrhoids were found during retroflexion. The       hemorrhoids were Grade II (internal hemorrhoids that prolapse but reduce       spontaneously). Impression:            - One 6 mm polyp in the transverse colon, removed with                         a cold snare. Resected and retrieved.                        - One 4 mm polyp in the ascending colon, removed with                         a cold snare. Resected and retrieved.                        - Non-bleeding internal hemorrhoids. Recommendation:        - Discharge patient to home.                        - Resume previous diet.                        - Continue present medications.                        - Await pathology results.                        -  Repeat colonoscopy is not recommended for                         surveillance. Procedure Code(s):     --- Professional ---                        [phone], Colonoscopy, flexible; with removal of                         tumor(s), polyp(s), or other lesion(s) by snare                         technique Diagnosis Code(s):     --- Professional ---                        D50.9, Iron deficiency anemia,  unspecified                        D12.3, Benign neoplasm of transverse colon (hepatic                         flexure or splenic flexure) CPT copyright 2022 American Medical Association. All rights reserved. The codes documented in this report are preliminary and upon coder review may  be revised to meet current compliance requirements. Midge Minium MD, MD 04/13/2023 12:58:55 PM This report has been signed electronically. Number of Addenda: 0 Note Initiated On: 04/13/2023 12:16 PM Scope Withdrawal Time: 0 hours 7 minutes 54 seconds  Total Procedure Duration: 0 hours 15 minutes 15 seconds  Estimated Blood Loss:  Estimated blood loss: none.      [facility]

## 2023-04-14 ENCOUNTER — Inpatient Hospital Stay: Payer: 59

## 2023-04-14 ENCOUNTER — Encounter: Payer: Self-pay | Admitting: Gastroenterology

## 2023-04-14 DIAGNOSIS — I959 Hypotension, unspecified: Secondary | ICD-10-CM

## 2023-04-14 DIAGNOSIS — J9621 Acute and chronic respiratory failure with hypoxia: Secondary | ICD-10-CM | POA: Insufficient documentation

## 2023-04-14 DIAGNOSIS — I9589 Other hypotension: Secondary | ICD-10-CM

## 2023-04-14 DIAGNOSIS — J9601 Acute respiratory failure with hypoxia: Secondary | ICD-10-CM

## 2023-04-14 DIAGNOSIS — J189 Pneumonia, unspecified organism: Secondary | ICD-10-CM | POA: Diagnosis not present

## 2023-04-14 DIAGNOSIS — I482 Chronic atrial fibrillation, unspecified: Secondary | ICD-10-CM | POA: Diagnosis not present

## 2023-04-14 LAB — TYPE AND SCREEN
ABO/RH(D): A NEG
Antibody Screen: POSITIVE
DAT, IgG: NEGATIVE
Unit division: 0
Unit division: 0
Unit division: 0
Unit division: 0
Weak D: POSITIVE

## 2023-04-14 LAB — BASIC METABOLIC PANEL
Anion gap: 12 (ref 5–15)
BUN: 10 mg/dL (ref 8–23)
CO2: 25 mmol/L (ref 22–32)
Calcium: 8.8 mg/dL — ABNORMAL LOW (ref 8.9–10.3)
Chloride: 101 mmol/L (ref 98–111)
Creatinine, Ser: 0.85 mg/dL (ref 0.44–1.00)
GFR, Estimated: >60 mL/min (ref >60)
Glucose, Bld: 92 mg/dL (ref 70–99)
Potassium: 4.1 mmol/L (ref 3.5–5.1)
Sodium: 138 mmol/L (ref 135–145)

## 2023-04-14 LAB — RESPIRATORY PANEL BY PCR

## 2023-04-14 LAB — BPAM RBC
Blood Product Expiration Date: 202409222359
Blood Product Expiration Date: 202409262359
Blood Product Expiration Date: 202409262359
Blood Product Expiration Date: 202409262359
ISSUE DATE / TIME: 202408270017
ISSUE DATE / TIME: 202408271237
ISSUE DATE / TIME: 202408291833
Unit Type and Rh: 600
Unit Type and Rh: 600
Unit Type and Rh: 600
Unit Type and Rh: 600

## 2023-04-14 LAB — CBC
HCT: 28.6 % — ABNORMAL LOW (ref 36.0–46.0)
Hemoglobin: 8.8 g/dL — ABNORMAL LOW (ref 12.0–15.0)
MCH: 24.6 pg — ABNORMAL LOW (ref 26.0–34.0)
MCHC: 30.8 g/dL (ref 30.0–36.0)
MCV: 80.1 fL (ref 80.0–100.0)
Platelets: 196 10*3/uL (ref 150–400)
RBC: 3.57 MIL/uL — ABNORMAL LOW (ref 3.87–5.11)
RDW: 18.6 % — ABNORMAL HIGH (ref 11.5–15.5)
WBC: 8.3 10*3/uL (ref 4.0–10.5)
nRBC: 0.5 % — ABNORMAL HIGH (ref 0.0–0.2)

## 2023-04-14 LAB — SARS CORONAVIRUS 2 BY RT PCR: SARS Coronavirus 2 by RT PCR: NEGATIVE

## 2023-04-14 LAB — GLUCOSE, CAPILLARY
Glucose-Capillary: 89 mg/dL (ref 70–99)
Glucose-Capillary: 116 mg/dL — ABNORMAL HIGH (ref 70–99)
Glucose-Capillary: 93 mg/dL (ref 70–99)

## 2023-04-14 MED ORDER — DILTIAZEM HCL ER COATED BEADS 240 MG PO CP24
240.0000 mg | ORAL_CAPSULE | Freq: Every day | ORAL | Status: DC
  Administered 2023-04-14: 240 mg via ORAL
  Filled 2023-04-14: qty 1

## 2023-04-14 MED ORDER — FUROSEMIDE 20 MG PO TABS
20.0000 mg | ORAL_TABLET | Freq: Once | ORAL | Status: AC
  Administered 2023-04-14: 20 mg via ORAL
  Filled 2023-04-14: qty 1

## 2023-04-14 MED ORDER — AZITHROMYCIN 500 MG PO TABS
250.0000 mg | ORAL_TABLET | Freq: Every day | ORAL | Status: DC
  Administered 2023-04-15 – 2023-04-16 (×2): 250 mg via ORAL
  Filled 2023-04-14 (×2): qty 1

## 2023-04-14 MED ORDER — AZITHROMYCIN 500 MG PO TABS
500.0000 mg | ORAL_TABLET | Freq: Every day | ORAL | Status: AC
  Administered 2023-04-14: 500 mg via ORAL
  Filled 2023-04-14: qty 1

## 2023-04-14 MED ORDER — SODIUM CHLORIDE 0.9 % IV SOLN
2.0000 g | INTRAVENOUS | Status: DC
  Administered 2023-04-14 – 2023-04-15 (×2): 2 g via INTRAVENOUS
  Filled 2023-04-14 (×4): qty 20

## 2023-04-14 MED ORDER — MAGIC MOUTHWASH
5.0000 mL | Freq: Four times a day (QID) | ORAL | Status: DC
  Administered 2023-04-14 – 2023-04-16 (×7): 5 mL via ORAL
  Filled 2023-04-14 (×12): qty 5

## 2023-04-14 MED ORDER — DILTIAZEM HCL ER COATED BEADS 120 MG PO CP24
120.0000 mg | ORAL_CAPSULE | Freq: Every day | ORAL | Status: DC
  Administered 2023-04-15 – 2023-04-16 (×2): 120 mg via ORAL
  Filled 2023-04-14 (×2): qty 1

## 2023-04-14 MED ORDER — SODIUM CHLORIDE 0.9 % IV BOLUS
500.0000 mL | Freq: Once | INTRAVENOUS | Status: AC
  Administered 2023-04-14: 500 mL via INTRAVENOUS

## 2023-04-14 NOTE — Care Management Important Message (Signed)
Important Message  Patient Details  Name: Andrea Kennedy MRN: [id number] Date of Birth: [birth year]   Medicare Important Message Given:  Yes  Patient is in an isolation room so I reviewed her Important Message from Medicare by phone [phone]). She stated she understood her rights and thanked her for time and wished her a speedy recovery.   Olegario Messier A Nobel Brar 04/14/2023, 1:50 PM

## 2023-04-14 NOTE — Assessment & Plan Note (Signed)
Viral respiratory panel and COVID test negative.  Empirically started Rocephin and Zithromax.  Converted over to [place] and Zithromax upon discharge.

## 2023-04-14 NOTE — TOC Progression Note (Signed)
Transition of Care [facility]) - Progression Note    Patient Details  Name: Andrea Kennedy MRN: [id number] Date of Birth: Mar 13, 1946  Transition of Care [facility]) CM/SW Contact  Allena Katz, LCSW Phone Number: 04/14/2023, 2:09 PM  Clinical Narrative:  Respiratory panel pending. TOC follow for care plan updates and needs.          Expected Discharge Plan and Services                                               Social Determinants of Health (SDOH) Interventions SDOH Screenings   Food Insecurity: No Food Insecurity (04/10/2023)  Housing: Low Risk  (04/10/2023)  Transportation Needs: No Transportation Needs (04/10/2023)  Utilities: Not At Risk (04/10/2023)  Financial Resource Strain: Medium Risk (11/02/2021)   Received from [facility], [facility]  Tobacco Use: High Risk (04/13/2023)    Readmission Risk Interventions     No data to display

## 2023-04-14 NOTE — Plan of Care (Signed)

## 2023-04-14 NOTE — Progress Notes (Signed)
   04/14/23 0757  Assess: MEWS Score  Temp 100 F (37.8 C)  BP 113/63  MAP (mmHg) 79  Pulse Rate (!) 120  Resp 18  SpO2 (!) 83 %  O2 Device Room Air  Assess: MEWS Score  MEWS Temp 0  MEWS Systolic 0  MEWS Pulse 2  MEWS RR 0  MEWS LOC 0  MEWS Score 2  MEWS Score Color Yellow  Assess: if the MEWS score is Yellow or Red  Were vital signs accurate and taken at a resting state? Yes  Does the patient meet 2 or more of the SIRS criteria? Yes  Does the patient have a confirmed or suspected source of infection? No  MEWS guidelines implemented  Yes, yellow  Treat  MEWS Interventions Considered administering scheduled or prn medications/treatments as ordered  Take Vital Signs  Increase Vital Sign Frequency  Yellow: Q2hr x1, continue Q4hrs until patient remains green for 12hrs  Escalate  MEWS: Escalate Yellow: Discuss with charge nurse and consider notifying provider and/or RRT  Provider Notification  Provider Name/Title wieting  Date Provider Notified 04/14/23  Time Provider Notified 0757  Method of Notification Face-to-face;Page  Notification Reason Other (Comment) (her spo2 is 81 in room air and heart rate is 120)  Provider response See new orders  Date of Provider Response 04/14/23  Time of Provider Response 0758  Assess: SIRS CRITERIA  SIRS Temperature  0  SIRS Pulse 1  SIRS Respirations  0  SIRS WBC 0  SIRS Score Sum  1

## 2023-04-14 NOTE — Assessment & Plan Note (Signed)
Will give fluid bolus.  And lower the dose of Cardizem CD for tomorrow.

## 2023-04-14 NOTE — Progress Notes (Addendum)
Progress Note   Patient: Andrea Kennedy ZOX:[id number] DOB: 25-Jan-1946 DOA: 04/10/2023     3 DOS: the patient was seen and examined on 04/14/2023   Brief hospital course: 77 y.o. female with medical history significant of A fib on Eliquis (last dose was this morning), HTN, HLD, stroke with right-sided weakness and slurred speech, GERD, depression with anxiety, CKD stage IIIa, iron deficiency anemia, lung cancer, sciatic, lupus, who presents with weakness and dark stool.  Her hemoglobin was as low as 5.6, she received a blood transfusion last night, but additional blood transfusion the morning of 8/27, also given IV iron.  EGD and colonoscopy pushed off to 8/29 since the patient was not cleaned out with the bowel prep.  8/29.  Hemoglobin dropped down to 7.1.  Will give a unit of packed red blood cells.  EGD showing nonbleeding gastric ulcer.  2 polyps removed with colonoscopy. 8/30.  This morning patient with a temperature of 100 feels congested and a cough.  Patient dropped pulse ox with ambulation and was tachycardic this morning.  COVID test negative.  Viral respiratory panel pending.  Assessment and Plan: * Acute respiratory failure with hypoxia (HCC) Pulse ox dropped down to 81% with ambulation.  Oxygen supplementation.  Possible pneumonia.  COVID test negative.  Viral respiratory panel pending.  Start empiric antibiotic.  Chronic atrial fibrillation with RVR (HCC) With fast heart rate this morning I increase Cardizem CD to 240 mg daily but will go back to 120 mg tomorrow.  Restarted Eliquis this morning.  Multifocal pneumonia Could be viral in nature with white count being normal this morning.  Will start antibiotics Rocephin and Zithromax.  Check viral respiratory panel.  COVID test negative.  Hypotension Will give fluid bolus.  And lower the dose of Cardizem CD for tomorrow.  Acute blood loss anemia Hemoglobin as low as 5.6 on the day of admission.  Patient received 3 units of packed  red blood cells during hospital course.  Last hemoglobin 8.8.   EGD showing a nonbleeding gastric ulcer.  2 polyps removed on colonoscopy.  Restart Eliquis today.  Iron deficiency anemia Ferritin very low at 6 going along with iron deficiency anemia.  Received IV iron.  Benign essential HTN On Cardizem CD.  Stroke (cerebrum) (HCC) - L frontal embolic s/p IV tPA, d/t AF Previous history of stroke with right-sided weakness.  Restart Eliquis tomorrow morning.  Chronic kidney disease, stage 3a (HCC) Creatinine 0.85 with a GFR greater than 60  Depression with anxiety Continue Lexapro  B12 deficiency B12 level 592.        Subjective: Patient not feeling well this morning.  Feels a little congested in the chest.  Pulse ox dropped down to 81% with walking around.  Some cough.  Had low-grade fever of 100.  Physical Exam: Vitals:   04/14/23 0758 04/14/23 0805 04/14/23 1102 04/14/23 1118  BP:   (!) 82/61 (!) 80/60  Pulse:   90   Resp:   18   Temp:   98.2 F (36.8 C)   TempSrc:      SpO2: (!) 81% 91% 100%   Weight:      Height:       Physical Exam HENT:     Head: Normocephalic.     Mouth/Throat:     Pharynx: No oropharyngeal exudate.  Eyes:     General: Lids are normal.     Conjunctiva/sclera: Conjunctivae normal.  Cardiovascular:     Rate and Rhythm: Tachycardia present.  Rhythm irregularly irregular.     Heart sounds: Normal heart sounds, S1 normal and S2 normal.  Pulmonary:     Breath sounds: Examination of the right-lower field reveals decreased breath sounds and rhonchi. Examination of the left-lower field reveals decreased breath sounds and rhonchi. Decreased breath sounds and rhonchi present. No wheezing or rales.  Abdominal:     Palpations: Abdomen is soft.     Tenderness: There is no abdominal tenderness.  Musculoskeletal:     Right lower leg: No swelling.     Left lower leg: No swelling.  Skin:    General: Skin is warm.     Findings: No rash.  Neurological:      Mental Status: She is alert and oriented to person, place, and time.     Comments: Chronic slurred speech and facial droop secondary to previous stroke.  Chronic right-sided weakness.     Data Reviewed: Creatinine 0.85, hemoglobin 8.8, platelet count 196, white blood cell count 8.3  Family Communication: Unable to reach family on any numbers listed in the computer.  Disposition: Status is: Inpatient Remains inpatient appropriate because: With low-grade fever tachycardia and now hypotension will watch here in the hospital.  Start antibiotics.  COVID test negative.  Viral respiratory panel pending.  Planned Discharge Destination: Home    Time spent: 30 minutes Case discussed with nursing staff.  Author: Alford Highland, MD 04/14/2023 11:31 AM  For on call review [email or web address].

## 2023-04-14 NOTE — Assessment & Plan Note (Signed)
Pulse ox dropped down to 81% with ambulation.  Oxygen supplementation.  Possible pneumonia.  COVID test negative.  Viral respiratory panel pending.  Start empiric antibiotic.

## 2023-04-15 DIAGNOSIS — I9589 Other hypotension: Secondary | ICD-10-CM | POA: Diagnosis not present

## 2023-04-15 DIAGNOSIS — I482 Chronic atrial fibrillation, unspecified: Secondary | ICD-10-CM | POA: Diagnosis not present

## 2023-04-15 DIAGNOSIS — J189 Pneumonia, unspecified organism: Secondary | ICD-10-CM | POA: Diagnosis not present

## 2023-04-15 DIAGNOSIS — J9601 Acute respiratory failure with hypoxia: Secondary | ICD-10-CM | POA: Diagnosis not present

## 2023-04-15 LAB — GLUCOSE, CAPILLARY
Glucose-Capillary: 98 mg/dL (ref 70–99)
Glucose-Capillary: 94 mg/dL (ref 70–99)
Glucose-Capillary: 97 mg/dL (ref 70–99)

## 2023-04-15 MED ORDER — IPRATROPIUM-ALBUTEROL 0.5-2.5 (3) MG/3ML IN SOLN: 3.0000 mL | Freq: Four times a day (QID) | RESPIRATORY_TRACT | Status: DC

## 2023-04-15 MED ORDER — PANTOPRAZOLE SODIUM 40 MG PO TBEC
40.0000 mg | DELAYED_RELEASE_TABLET | Freq: Two times a day (BID) | ORAL | Status: DC
  Administered 2023-04-15 – 2023-04-16 (×2): 40 mg via ORAL
  Filled 2023-04-15 (×2): qty 1

## 2023-04-15 MED ORDER — IPRATROPIUM-ALBUTEROL 0.5-2.5 (3) MG/3ML IN SOLN: 3.0000 mL | Freq: Four times a day (QID) | RESPIRATORY_TRACT | Status: DC | PRN

## 2023-04-15 NOTE — Plan of Care (Signed)

## 2023-04-15 NOTE — Progress Notes (Signed)
Physical Therapy Treatment Patient Details Name: Andrea Kennedy MRN: [id number] DOB: [birth year] Today's Date: 04/15/2023   History of Present Illness Andrea Kennedy is a 77 y.o. female with medical history significant of A fib on Eliquis (last dose was this morning), HTN, HLD, stroke with right-sided weakness and slurred speech, GERD, depression with anxiety, CKD stage IIIa, iron deficiency anemia, lung cancer, sciatic, lupus, who presents with weakness and dark stool.    PT Comments  The pt is presenting in good spirits. She demonstrates need for supervision for transfer to bedside commode, but is able to handle hygiene with independence or Mod Independence. The pt ambulates in room 10', 20', 10' with seated rest breaks between each trial. The pt continues to present with some balance and gait impairments. PT will continue to follow.     If plan is discharge home, recommend the following: A little help with walking and/or transfers;A little help with bathing/dressing/bathroom;Assist for transportation;Help with stairs or ramp for entrance;Assistance with cooking/housework   Can travel by private vehicle        Equipment Recommendations  None recommended by PT    Recommendations for Other Services       Precautions / Restrictions Precautions Precautions: Fall Restrictions Weight Bearing Restrictions: No     Mobility  Bed Mobility Overal bed mobility: Modified Independent                  Transfers Overall transfer level: Needs assistance Equipment used: Rolling walker (2 wheels) Transfers: Sit to/from Stand Sit to Stand: Contact guard assist                Ambulation/Gait Ambulation/Gait assistance: Contact guard assist Gait Distance (Feet): 40 Feet Assistive device: Rolling walker (2 wheels) Gait Pattern/deviations: Decreased step length - right, Decreased stride length, Decreased weight shift to right       General Gait Details: patient ambulated out  into hall with single UE support, hand held or holding to rail. ( no walker in room at time of evaluation) Patient received on 2 liters O2, but reports she does not have at home. O2 sats dropped to 80% with ambulation short distance on room air.   Stairs             Wheelchair Mobility     Tilt Bed    Modified Rankin (Stroke Patients Only)       Balance Overall balance assessment: Needs assistance Sitting-balance support: Feet supported Sitting balance-Leahy Scale: Good       Standing balance-Leahy Scale: Fair                              Cognition Arousal: Alert Behavior During Therapy: WFL for tasks assessed/performed Overall Cognitive Status: Within Functional Limits for tasks assessed                                          Exercises      General Comments        Pertinent Vitals/Pain      Home Living                          Prior Function            PT Goals (current goals can now be found in the care plan  section) Acute Rehab PT Goals Patient Stated Goal: to return home PT Goal Formulation: With patient Time For Goal Achievement: 04/26/23 Potential to Achieve Goals: Good Progress towards PT goals: Progressing toward goals    Frequency    Min 1X/week      PT Plan      Co-evaluation              AM-PAC PT "6 Clicks" Mobility   Outcome Measure  Help needed turning from your back to your side while in a flat bed without using bedrails?: None Help needed moving from lying on your back to sitting on the side of a flat bed without using bedrails?: None Help needed moving to and from a bed to a chair (including a wheelchair)?: A Little Help needed standing up from a chair using your arms (e.g., wheelchair or bedside chair)?: A Little Help needed to walk in hospital room?: A Little Help needed climbing 3-5 steps with a railing? : A Little 6 Click Score: 20    End of Session Equipment  Utilized During Treatment: Oxygen Activity Tolerance: Patient tolerated treatment well Patient left: in bed;with call bell/phone within reach;with bed alarm set Nurse Communication: Mobility status PT Visit Diagnosis: Other abnormalities of gait and mobility (R26.89);Muscle weakness (generalized) (M62.81);Difficulty in walking, not elsewhere classified (R26.2);Hemiplegia and hemiparesis Hemiplegia - Right/Left: Right Hemiplegia - dominant/non-dominant: Dominant Hemiplegia - caused by: Cerebral infarction     Time: 1191-4782 PT Time Calculation (min) (ACUTE ONLY): 29 min  Charges:    $Gait Training: 23-37 mins PT General Charges $$ ACUTE PT VISIT: 1 Visit                     3:40 PM, 04/15/23 Shawna Kiener A. Mordecai Maes PT, DPT Physical Therapist - [facility] A Agnes Probert 04/15/2023, 3:38 PM

## 2023-04-15 NOTE — Progress Notes (Signed)
Progress Note   Patient: Andrea Kennedy KGM:[id number] DOB: 06-19-1946 DOA: 04/10/2023     4 DOS: the patient was seen and examined on 04/15/2023   Brief hospital course: 77 y.o. female with medical history significant of A fib on Eliquis (last dose was this morning), HTN, HLD, stroke with right-sided weakness and slurred speech, GERD, depression with anxiety, CKD stage IIIa, iron deficiency anemia, lung cancer, sciatic, lupus, who presents with weakness and dark stool.  Her hemoglobin was as low as 5.6, she received a blood transfusion last night, but additional blood transfusion the morning of 8/27, also given IV iron.  EGD and colonoscopy pushed off to 8/29 since the patient was not cleaned out with the bowel prep.  8/29.  Hemoglobin dropped down to 7.1.  Will give a unit of packed red blood cells.  EGD showing nonbleeding gastric ulcer.  2 polyps removed with colonoscopy. 8/30.  This morning patient with a temperature of 100 feels congested and a cough.  Patient dropped pulse ox with ambulation and was tachycardic this morning.  COVID test negative.  Viral respiratory panel negative.  Empirically placed on Rocephin and Zithromax. 8/31.  Patient feeling better today.  Still dropped her oxygen with ambulation down into the 80s.  Patient hoping to go home without oxygen.  Assessment and Plan: * Acute respiratory failure with hypoxia (HCC) Pulse ox dropped down to 80% with ambulation.  Oxygen supplementation.  Continue empiric antibiotics and started nebulizer treatments.  Viral respiratory panel and COVID test negative.  Chronic atrial fibrillation with RVR (HCC) Heart rate better today on Cardizem CD normal dose 120 mg and Eliquis.  Multifocal pneumonia Viral respiratory panel and COVID test negative.  Empirically started Rocephin and Zithromax yesterday.  Hypotension Blood pressure on the lower side.  Continue low-dose Cardizem CD.  Acute blood loss anemia Hemoglobin as low as 5.6 on  the day of admission.  Patient received 3 units of packed red blood cells during hospital course.  Last hemoglobin 8.8.   EGD showing a nonbleeding gastric ulcer.  2 polyps removed on colonoscopy.  Continue Eliquis.  Iron deficiency anemia Ferritin very low at 6 going along with iron deficiency anemia.  Received IV iron.  Benign essential HTN On Cardizem CD.  Stroke (cerebrum) (HCC) - L frontal embolic s/p IV tPA, d/t AF Previous history of stroke with right-sided weakness.  Continue Eliquis.  Chronic kidney disease, stage 3a (HCC) Creatinine 0.85 with a GFR greater than 60  Depression with anxiety Continue Lexapro  B12 deficiency B12 level 592.        Subjective: Patient unable to come off oxygen again today.  Feeling better today than yesterday.  Had low-grade fever yesterday.  Started on antibiotics for potential pneumonia.  Physical Exam: Vitals:   04/15/23 0810 04/15/23 0812 04/15/23 1023 04/15/23 1227  BP:  119/69  105/73  Pulse:  82  81  Resp:    16  Temp:  98.6 F (37 C)  98.7 F (37.1 C)  TempSrc:  Oral  Oral  SpO2: (!) 81% 91% 91% 92%  Weight:      Height:       Physical Exam HENT:     Head: Normocephalic.     Mouth/Throat:     Pharynx: No oropharyngeal exudate.  Eyes:     General: Lids are normal.     Conjunctiva/sclera: Conjunctivae normal.  Cardiovascular:     Rate and Rhythm: Normal rate. Rhythm irregularly irregular.     Heart  sounds: Normal heart sounds, S1 normal and S2 normal.  Pulmonary:     Breath sounds: Examination of the right-lower field reveals decreased breath sounds and rhonchi. Examination of the left-lower field reveals decreased breath sounds and rhonchi. Decreased breath sounds and rhonchi present. No wheezing or rales.  Abdominal:     Palpations: Abdomen is soft.     Tenderness: There is no abdominal tenderness.  Musculoskeletal:     Right lower leg: No swelling.     Left lower leg: No swelling.  Skin:    General: Skin is  warm.     Findings: No rash.  Neurological:     Mental Status: She is alert and oriented to person, place, and time.     Comments: Chronic slurred speech and facial droop secondary to previous stroke.  Chronic right-sided weakness.     Data Reviewed: Last hemoglobin 8.8  Family Communication: Spoke with husband at the bedside and daughter on the phone  Disposition: Status is: Inpatient Remains inpatient appropriate because: Will try to give 1 more day to see if we can get her off oxygen since she does not wear oxygen at home.  Planned Discharge Destination: Home with Home Health    Time spent: 28 minutes  Author: Alford Highland, MD 04/15/2023 1:10 PM  For on call review [email or web address].

## 2023-04-15 NOTE — Progress Notes (Signed)
   Patient Saturations on Room Air at Rest = 81%  Patient Saturations on [employer] while Ambulating = 80%  Patient Saturations on 2 Liters of oxygen while Ambulating = 91%

## 2023-04-15 NOTE — TOC Progression Note (Addendum)
Transition of Care [facility]) - Progression Note    Patient Details  Name: BRIDGITT WARDELL MRN: [id number] Date of Birth: [birth year]  Transition of Care [facility]) CM/SW Contact  Allena Katz, LCSW Phone Number: 04/15/2023, 12:00 PM  Clinical Narrative:   Pt qualifies for home oxygen. Mitch with [employer] messaged to see if he can deliver today for discharge tomorrow. Cory with [employer] asked if we can add nursing since she is new oxygen requirement.   Denyse Amass able to add nursing. MD notified to add nursing to order.        Expected Discharge Plan and Services                                               Social Determinants of Health (SDOH) Interventions SDOH Screenings   Food Insecurity: No Food Insecurity (04/10/2023)  Housing: Low Risk  (04/10/2023)  Transportation Needs: No Transportation Needs (04/10/2023)  Utilities: Not At Risk (04/10/2023)  Financial Resource Strain: Medium Risk (11/02/2021)   [facility]  Tobacco Use: High Risk (04/13/2023)    Readmission Risk Interventions     No data to display

## 2023-04-16 DIAGNOSIS — J189 Pneumonia, unspecified organism: Secondary | ICD-10-CM | POA: Diagnosis not present

## 2023-04-16 DIAGNOSIS — I9589 Other hypotension: Secondary | ICD-10-CM | POA: Diagnosis not present

## 2023-04-16 DIAGNOSIS — J9621 Acute and chronic respiratory failure with hypoxia: Secondary | ICD-10-CM

## 2023-04-16 DIAGNOSIS — I482 Chronic atrial fibrillation, unspecified: Secondary | ICD-10-CM | POA: Diagnosis not present

## 2023-04-16 LAB — CBC
HCT: 31 % — ABNORMAL LOW (ref 36.0–46.0)
Hemoglobin: 9.4 g/dL — ABNORMAL LOW (ref 12.0–15.0)
MCH: 24.9 pg — ABNORMAL LOW (ref 26.0–34.0)
MCHC: 30.3 g/dL (ref 30.0–36.0)
MCV: 82.2 fL (ref 80.0–100.0)
Platelets: 196 10*3/uL (ref 150–400)
RBC: 3.77 MIL/uL — ABNORMAL LOW (ref 3.87–5.11)
RDW: 19.4 % — ABNORMAL HIGH (ref 11.5–15.5)
WBC: 4.2 10*3/uL (ref 4.0–10.5)
nRBC: 0.5 % — ABNORMAL HIGH (ref 0.0–0.2)

## 2023-04-16 LAB — BASIC METABOLIC PANEL
Anion gap: 7 (ref 5–15)
BUN: 8 mg/dL (ref 8–23)
CO2: 30 mmol/L (ref 22–32)
Calcium: 8.3 mg/dL — ABNORMAL LOW (ref 8.9–10.3)
Chloride: 101 mmol/L (ref 98–111)
Creatinine, Ser: 0.86 mg/dL (ref 0.44–1.00)
GFR, Estimated: >60 mL/min (ref >60)
Glucose, Bld: 93 mg/dL (ref 70–99)
Potassium: 3.9 mmol/L (ref 3.5–5.1)
Sodium: 138 mmol/L (ref 135–145)

## 2023-04-16 LAB — GLUCOSE, CAPILLARY: Glucose-Capillary: 94 mg/dL (ref 70–99)

## 2023-04-16 MED ORDER — NICOTINE 14 MG/24HR TD PT24: MEDICATED_PATCH | TRANSDERMAL | 0 refills | Status: AC

## 2023-04-16 MED ORDER — HYDROCODONE-ACETAMINOPHEN 7.5-325 MG PO TABS: 1.0000 | ORAL_TABLET | Freq: Four times a day (QID) | ORAL | 0 refills | Status: AC | PRN

## 2023-04-16 MED ORDER — CEFDINIR 300 MG PO CAPS: 300.0000 mg | ORAL_CAPSULE | Freq: Two times a day (BID) | ORAL | 0 refills | Status: AC

## 2023-04-16 MED ORDER — AZITHROMYCIN 250 MG PO TABS: 250.0000 mg | ORAL_TABLET | Freq: Every day | ORAL | 0 refills | Status: AC

## 2023-04-16 MED ORDER — UMECLIDINIUM-VILANTEROL 62.5-25 MCG/ACT IN AEPB: 1.0000 | INHALATION_SPRAY | Freq: Every day | RESPIRATORY_TRACT | 0 refills | Status: AC

## 2023-04-16 MED ORDER — CEFDINIR 300 MG PO CAPS
300.0000 mg | ORAL_CAPSULE | Freq: Two times a day (BID) | ORAL | Status: DC
  Administered 2023-04-16: 300 mg via ORAL
  Filled 2023-04-16: qty 1

## 2023-04-16 MED ORDER — BENZONATATE 100 MG PO CAPS
100.0000 mg | ORAL_CAPSULE | Freq: Three times a day (TID) | ORAL | Status: DC | PRN
  Administered 2023-04-16: 100 mg via ORAL
  Filled 2023-04-16: qty 1

## 2023-04-16 MED ORDER — PANTOPRAZOLE SODIUM 40 MG PO TBEC: 40.0000 mg | DELAYED_RELEASE_TABLET | Freq: Two times a day (BID) | ORAL | 0 refills | Status: AC

## 2023-04-16 MED ORDER — ESCITALOPRAM OXALATE 10 MG PO TABS: 10.0000 mg | ORAL_TABLET | Freq: Every day | ORAL | 0 refills | Status: AC

## 2023-04-16 MED ORDER — BENZONATATE 100 MG PO CAPS: 100.0000 mg | ORAL_CAPSULE | Freq: Three times a day (TID) | ORAL | 0 refills | Status: AC | PRN

## 2023-04-16 MED ORDER — ALBUTEROL SULFATE HFA 108 (90 BASE) MCG/ACT IN AERS: 2.0000 | INHALATION_SPRAY | Freq: Four times a day (QID) | RESPIRATORY_TRACT | 0 refills | Status: AC | PRN

## 2023-04-16 MED ORDER — CYANOCOBALAMIN 1000 MCG PO TABS: 1000.0000 ug | ORAL_TABLET | Freq: Every day | ORAL | 0 refills | Status: AC

## 2023-04-16 NOTE — Plan of Care (Signed)
Patient is alert and oriented X 4. Discharge instruction given.no any question and concern at this time. Problem: Education: Goal: Knowledge of General Education information will improve Description: Including pain rating scale, medication(s)/side effects and non-pharmacologic comfort measures Outcome: Completed/Met   Problem: Health Behavior/Discharge Planning: Goal: Ability to manage health-related needs will improve Outcome: Completed/Met   Problem: Clinical Measurements: Goal: Ability to maintain clinical measurements within normal limits will improve Outcome: Completed/Met Goal: Will remain free from infection Outcome: Completed/Met Goal: Diagnostic test results will improve Outcome: Completed/Met Goal: Respiratory complications will improve Outcome: Completed/Met Goal: Cardiovascular complication will be avoided Outcome: Completed/Met   Problem: Activity: Goal: Risk for activity intolerance will decrease Outcome: Completed/Met   Problem: Nutrition: Goal: Adequate nutrition will be maintained Outcome: Completed/Met   Problem: Coping: Goal: Level of anxiety will decrease Outcome: Completed/Met   Problem: Elimination: Goal: Will not experience complications related to bowel motility Outcome: Completed/Met Goal: Will not experience complications related to urinary retention Outcome: Completed/Met   Problem: Pain Managment: Goal: General experience of comfort will improve Outcome: Completed/Met   Problem: Safety: Goal: Ability to remain free from injury will improve Outcome: Completed/Met   Problem: Skin Integrity: Goal: Risk for impaired skin integrity will decrease Outcome: Completed/Met

## 2023-04-16 NOTE — Discharge Summary (Signed)
Physician Discharge Summary   Patient: Andrea Kennedy MRN: [id number] DOB: 06-Apr-1946  Admit date:     04/10/2023  Discharge date: 04/16/23  Discharge Physician: Alford Highland   PCP: Gracelyn Nurse, MD   Recommendations at discharge:   Follow-up PCP 5 days Refer to hematology for consideration of iron infusions. Home health  Discharge Diagnoses: Principal Problem:   Acute on chronic respiratory failure with hypoxia (HCC) Active Problems:   Chronic atrial fibrillation with RVR (HCC)   Multifocal pneumonia   Hypotension   Acute blood loss anemia   Benign essential HTN   Iron deficiency anemia   Stroke (cerebrum) (HCC) - L frontal embolic s/p IV tPA, d/t AF   Gastroesophageal reflux disease with esophagitis   Chronic kidney disease, stage 3a (HCC)   Depression with anxiety   Cocaine abuse (HCC)   Tobacco abuse   B12 deficiency   Acute gastric ulcer without hemorrhage or perforation   Polyp of ascending colon    Hospital Course: 77 y.o. female with medical history significant of A fib on Eliquis (last dose was this morning), HTN, HLD, stroke with right-sided weakness and slurred speech, GERD, depression with anxiety, CKD stage IIIa, iron deficiency anemia, lung cancer, sciatic, lupus, who presents with weakness and dark stool.  Her hemoglobin was as low as 5.6, she received a blood transfusion last night, but additional blood transfusion the morning of 8/27, also given IV iron.  EGD and colonoscopy pushed off to 8/29 since the patient was not cleaned out with the bowel prep.  8/29.  Hemoglobin dropped down to 7.1.  Will give a unit of packed red blood cells.  EGD showing nonbleeding gastric ulcer.  2 polyps removed with colonoscopy. 8/30.  This morning patient with a temperature of 100 feels congested and a cough.  Patient dropped pulse ox with ambulation and was tachycardic this morning.  COVID test negative.  Viral respiratory panel negative.  Empirically placed on  Rocephin and Zithromax. 8/31.  Patient feeling better today.  Still dropped her oxygen with ambulation down into the 80s.  Patient hoping to go home without oxygen. 9/1.  Patient unable to come off oxygen.  Will go home on 2 L of oxygen.  Stable for discharge home.  Assessment and Plan: * Acute on chronic respiratory failure with hypoxia (HCC) Pulse ox dropped down to 81% with ambulation on room air today.  Oxygen supplementation 24/7 at home.  Home health RN for new oxygen requirement.  Complete empiric antibiotics and prescribed inhalers for COPD.  Viral respiratory panel and COVID test negative.  Chronic atrial fibrillation with RVR (HCC) Continue Cardizem CD normal dose 120 mg and Eliquis.  Had elevated heart rate the other day with low-grade fever.  Multifocal pneumonia Viral respiratory panel and COVID test negative.  Empirically started Rocephin and Zithromax.  Converted over to [place] and Zithromax upon discharge.  Hypotension Blood pressure improved.  Continue low-dose Cardizem CD.  Acute blood loss anemia Hemoglobin as low as 5.6 on the day of admission.  Patient received 3 units of packed red blood cells during hospital course.  Last hemoglobin 9.4.   EGD showing a nonbleeding gastric ulcer.  Protonix prescribed.  2 polyps removed on colonoscopy.  Continue Eliquis.  Can consider capsule endoscopy as outpatient if anemia persists or if bleeding reoccurs.  Iron deficiency anemia Ferritin very low at 6 going along with iron deficiency anemia.  Received IV iron.  Consider iron infusions as outpatient.  Benign essential HTN  On Cardizem CD.  Stroke (cerebrum) (HCC) - L frontal embolic s/p IV tPA, d/t AF Previous history of stroke with right-sided weakness.  Continue Eliquis.  Chronic kidney disease, stage 3a (HCC) Creatinine 0.86 with a GFR greater than 60  Depression with anxiety Continue Lexapro  B12 deficiency B12 level 592.         Consultants:  Gastroenterology Procedures performed: EGD and colonoscopy Disposition: Home health Diet recommendation:  Cardiac diet DISCHARGE MEDICATION: Allergies as of 04/16/2023       Reactions   Hydroxychloroquine Other (See Comments)   "Lost vision in my eyes" patient states it took 4-5 years for her vision to return   Floxin [ofloxacin] Other (See Comments)   Scared/shaky/pain attacks/confusion   Levaquin [levofloxacin In D5w] Other (See Comments)   Unsure of reaction type        Medication List     STOP taking these medications    acetaminophen 325 MG tablet Commonly known as: TYLENOL   famotidine 20 MG tablet Commonly known as: PEPCID   nortriptyline 25 MG capsule Commonly known as: PAMELOR       TAKE these medications    albuterol 108 (90 Base) MCG/ACT inhaler Commonly known as: VENTOLIN HFA Inhale 2 puffs into the lungs every 6 (six) hours as needed for wheezing or shortness of breath.   apixaban 5 MG Tabs tablet Commonly known as: ELIQUIS Take 1 tablet (5 mg total) by mouth 2 (two) times daily.   atorvastatin 40 MG tablet Commonly known as: LIPITOR Take 1 tablet (40 mg total) by mouth daily at 6 PM.   azithromycin 250 MG tablet Commonly known as: ZITHROMAX Take 1 tablet (250 mg total) by mouth daily. Start taking on: April 17, 2023   B-complex with vitamin C tablet Take 1 tablet by mouth daily with breakfast.   benzonatate 100 MG capsule Commonly known as: TESSALON Take 1 capsule (100 mg total) by mouth 3 (three) times daily as needed for cough.   cefdinir 300 MG capsule Commonly known as: OMNICEF Take 1 capsule (300 mg total) by mouth every 12 (twelve) hours for 5 doses.   cyanocobalamin 1000 MCG tablet Take 1 tablet (1,000 mcg total) by mouth daily.   diltiazem 120 MG 24 hr capsule Commonly known as: CARDIZEM CD Take 1 capsule (120 mg total) by mouth daily.   docusate sodium 100 MG capsule Commonly known as: COLACE Take 100-200 mg by mouth  at bedtime.   escitalopram 10 MG tablet Commonly known as: LEXAPRO Take 1 tablet (10 mg total) by mouth daily.   HM Slow Release Iron 45 MG Tbcr Generic drug: Ferrous Sulfate Dried Take 45 mg by mouth in the morning.   HYDROcodone-acetaminophen 7.5-325 MG tablet Commonly known as: NORCO Take 1 tablet by mouth every 6 (six) hours as needed (pain.).   hydrOXYzine 25 MG tablet Commonly known as: ATARAX Take 25 mg by mouth 3 (three) times daily as needed for anxiety.   methocarbamol 500 MG tablet Commonly known as: ROBAXIN Take 1 tablet (500 mg total) by mouth every 6 (six) hours as needed for muscle spasms.   multivitamin with minerals Tabs tablet Take 1 tablet by mouth daily. Centrum Silver   nicotine 14 mg/24hr patch Commonly known as: NICODERM CQ - dosed in mg/24 hours One 14 mg patch to chest wall daily (okay to substitute generic) What changed:  how much to take how to take this when to take this additional instructions   pantoprazole 40 MG tablet  Commonly known as: PROTONIX Take 1 tablet (40 mg total) by mouth 2 (two) times daily.   umeclidinium-vilanterol 62.5-25 MCG/ACT Aepb Commonly known as: ANORO ELLIPTA Inhale 1 puff into the lungs daily.   vitamin D3 25 MCG tablet Commonly known as: CHOLECALCIFEROL Take 1 tablet (1,000 Units total) by mouth daily. What changed: when to take this        Follow-up Information     Gracelyn Nurse, MD Follow up in 5 day(s).   Specialty: Internal Medicine Contact information: [address] [place] [address] [phone]         Michaelyn Barter, MD Follow up in 2 week(s).   Specialty: Oncology Why: anemia, consider iron infusions Contact information: [address] [place] [address] [phone]                Discharge Exam: Ceasar Mons Weights   04/10/23 1557  Weight: 63.5 kg   Physical Exam HENT:     Head: Normocephalic.     Mouth/Throat:     Pharynx: No oropharyngeal  exudate.  Eyes:     General: Lids are normal.     Conjunctiva/sclera: Conjunctivae normal.  Cardiovascular:     Rate and Rhythm: Normal rate. Rhythm irregularly irregular.     Heart sounds: Normal heart sounds, S1 normal and S2 normal.  Pulmonary:     Breath sounds: Examination of the right-lower field reveals decreased breath sounds. Examination of the left-lower field reveals decreased breath sounds. Decreased breath sounds present. No wheezing, rhonchi or rales.  Abdominal:     Palpations: Abdomen is soft.     Tenderness: There is no abdominal tenderness.  Musculoskeletal:     Right lower leg: No swelling.     Left lower leg: No swelling.  Skin:    General: Skin is warm.     Findings: No rash.  Neurological:     Mental Status: She is alert and oriented to person, place, and time.     Comments: Chronic slurred speech and facial droop secondary to previous stroke.  Chronic right-sided weakness.      Condition at discharge: stable  The results of significant diagnostics from this hospitalization (including imaging, microbiology, ancillary and laboratory) are listed below for reference.   Imaging Studies: DG Chest Port 1 View  Result Date: 04/14/2023 CLINICAL DATA:  Respiratory failure, hypoxia EXAM: PORTABLE CHEST 1 VIEW COMPARISON:  04/08/2021 FINDINGS: The heart size and mediastinal contours are within normal limits. Mild diffuse interstitial opacity. Unchanged, irregular, somewhat nodular opacity over the right midlung, previously assessed by CT. Osseous structures unremarkable. The visualized skeletal structures are unremarkable. IMPRESSION: 1. Mild diffuse interstitial opacity, consistent with edema or atypical/viral infection. 2. Unchanged, irregular, somewhat nodular opacity over the right midlung, previously assessed by CT. Electronically Signed   By: Jearld Lesch M.D.   On: 04/14/2023 08:29    Microbiology: Results for orders placed or performed during the hospital  encounter of 04/10/23  SARS Coronavirus 2 by RT PCR (hospital order, performed in [facility] hospital lab) *cepheid single result test* Anterior Nasal Swab     Status: None   Collection Time: 04/14/23  8:30 AM   Specimen: Anterior Nasal Swab  Result Value Ref Range Status   SARS Coronavirus 2 by RT PCR NEGATIVE NEGATIVE Final    Comment: (NOTE) SARS-CoV-2 target nucleic acids are NOT DETECTED.  The SARS-CoV-2 RNA is generally detectable in upper and lower respiratory specimens during the acute phase of infection. The lowest concentration  of SARS-CoV-2 viral copies this assay can detect is 250 copies / mL. A negative result does not preclude SARS-CoV-2 infection and should not be used as the sole basis for treatment or other patient management decisions.  A negative result may occur with improper specimen collection / handling, submission of specimen other than nasopharyngeal swab, presence of viral mutation(s) within the areas targeted by this assay, and inadequate number of viral copies (<250 copies / mL). A negative result must be combined with clinical observations, patient history, and epidemiological information.  Fact Sheet for Patients:   RoadLapTop.co.za  Fact Sheet for Healthcare Providers: http://kim-miller.com/  This test is not yet approved or  cleared by the [place] FDA and has been authorized for detection and/or diagnosis of SARS-CoV-2 by FDA under an Emergency Use Authorization (EUA).  This EUA will remain in effect (meaning this test can be used) for the duration of the COVID-19 declaration under Section 564(b)(1) of the Act, 21 U.S.C. section 360bbb-3(b)(1), unless the authorization is terminated or revoked sooner.  Performed at [facility], [address] [place], [place] [address]   Respiratory (~20 pathogens) panel by PCR     Status: None   Collection Time: 04/14/23 11:20 AM   Specimen:  Nasopharyngeal Swab; Respiratory  Result Value Ref Range Status   Adenovirus NOT DETECTED NOT DETECTED Final   Coronavirus 229E NOT DETECTED NOT DETECTED Final    Comment: (NOTE) The Coronavirus on the Respiratory Panel, DOES NOT test for the novel  Coronavirus (2019 nCoV)    Coronavirus HKU1 NOT DETECTED NOT DETECTED Final   Coronavirus NL63 NOT DETECTED NOT DETECTED Final   Coronavirus OC43 NOT DETECTED NOT DETECTED Final   Metapneumovirus NOT DETECTED NOT DETECTED Final   Rhinovirus / Enterovirus NOT DETECTED NOT DETECTED Final   Influenza A NOT DETECTED NOT DETECTED Final   Influenza B NOT DETECTED NOT DETECTED Final   Parainfluenza Virus 1 NOT DETECTED NOT DETECTED Final   Parainfluenza Virus 2 NOT DETECTED NOT DETECTED Final   Parainfluenza Virus 3 NOT DETECTED NOT DETECTED Final   Parainfluenza Virus 4 NOT DETECTED NOT DETECTED Final   Respiratory Syncytial Virus NOT DETECTED NOT DETECTED Final   Bordetella pertussis NOT DETECTED NOT DETECTED Final   Bordetella Parapertussis NOT DETECTED NOT DETECTED Final   Chlamydophila pneumoniae NOT DETECTED NOT DETECTED Final   Mycoplasma pneumoniae NOT DETECTED NOT DETECTED Final    Comment: Performed at [facility], [address] [place], [place] [address]    Labs: CBC: Recent Labs  Lab 04/11/23 [phone] 04/11/23 1641 04/11/23 1830 04/13/23 0415 04/14/23 0328 04/16/23 0429  WBC 4.8 5.9 7.0  --  8.3 4.2  HGB 6.7* 8.7* 8.6* 7.9* 8.8* 9.4*  HCT 21.7* 28.4* 28.4*  --  28.6* 31.0*  MCV 75.6* 79.3* 79.8*  --  80.1 82.2  PLT 248 262 260  --  196 196   Basic Metabolic Panel: Recent Labs  Lab 04/11/23 0658 04/12/23 0439 04/13/23 0415 04/14/23 0328 04/16/23 0429  NA 139 140 136 138 138  K 4.2 3.9 4.0 4.1 3.9  CL 113* 114* 105 101 101  CO2 23 22 25 25 30   GLUCOSE 92 93 84 92 93  BUN 11 8 7* 10 8  CREATININE 0.89 0.87 0.89 0.85 0.86  CALCIUM 8.1* 8.2* 8.4* 8.8* 8.3*  MG  --  2.1  --   --   --    Liver Function  Tests: No results for input(s): "AST", "ALT", "  ALKPHOS", "BILITOT", "PROT", "ALBUMIN" in the last 168 hours. CBG: Recent Labs  Lab 04/14/23 1622 04/15/23 0811 04/15/23 1228 04/15/23 1724 04/16/23 0748  GLUCAP 93 98 94 97 94    Discharge time spent: greater than 30 minutes.  Signed: Alford Highland, MD [facility] 04/16/2023

## 2023-04-16 NOTE — Discharge Instructions (Signed)
No smoking. No BC powder or ather anti-inflammatories (advil, motrin, goody powder, alleve)

## 2023-04-16 NOTE — TOC Transition Note (Signed)
Transition of Care [facility]) - CM/SW Discharge Note   Patient Details  Name: Andrea Kennedy MRN: [id number] Date of Birth: 1946-06-22  Transition of Care [facility]) CM/SW Contact:  Kemper Durie, RN Phone Number: 04/16/2023, 12:10 PM   Clinical Narrative:     Patient with discharge orders home, already had oxygen delivered yesterday but report she was not educated on use.  Called [employer], spoke with Selena Batten, advised that education was supposed to be provided upon delivery.  She will look into why this was not done, however they are unable to provide someone to do teaching prior to discharge.  Bedside nurse aware, spoke with RT, they are unable to provide education on outside equipment.  Bedside nurse was able to briefly educate patient on use prior to discharge.  Kandee Keen with [facility] aware patient will go home today.   Final next level of care: Home w Home Health Services Barriers to Discharge: Barriers Resolved   Patient Goals and CMS Choice      Discharge Placement                         Discharge Plan and Services Additional resources added to the After Visit Summary for                  DME Arranged: Oxygen DME Agency: [employer] Date DME Agency Contacted: 04/16/23 Time DME Agency Contacted: 1209 Representative spoke with at DME Agency: Selena Batten HH Arranged: PT, OT, RN [facility] Agency: [facility] Date [facility] Agency Contacted: 04/16/23 Time HH Agency Contacted: 1210 Representative spoke with at [facility] Agency: Kandee Keen  Social Determinants of Health (SDOH) Interventions SDOH Screenings   Food Insecurity: No Food Insecurity (04/10/2023)  Housing: Low Risk  (04/10/2023)  Transportation Needs: No Transportation Needs (04/10/2023)  Utilities: Not At Risk (04/10/2023)  Financial Resource Strain: Medium Risk (11/02/2021)   Received from [facility]  Tobacco Use: High Risk (04/13/2023)     Readmission Risk Interventions     No data to display

## 2023-04-18 ENCOUNTER — Encounter: Payer: Self-pay | Admitting: Gastroenterology

## 2023-11-30 ENCOUNTER — Emergency Department
Admission: EM | Admit: 2023-11-30 | Discharge: 2023-11-30 | Disposition: A | Discharge status: Home / Self Care | Attending: Emergency Medicine | Admitting: Emergency Medicine

## 2023-11-30 ENCOUNTER — Emergency Department

## 2023-11-30 ENCOUNTER — Encounter: Payer: Self-pay | Admitting: Emergency Medicine

## 2023-11-30 ENCOUNTER — Other Ambulatory Visit: Payer: Self-pay

## 2023-11-30 DIAGNOSIS — G8929 Other chronic pain: Secondary | ICD-10-CM | POA: Diagnosis not present

## 2023-11-30 DIAGNOSIS — F10920 Alcohol use, unspecified with intoxication, uncomplicated: Secondary | ICD-10-CM | POA: Diagnosis not present

## 2023-11-30 DIAGNOSIS — Y9301 Activity, walking, marching and hiking: Secondary | ICD-10-CM | POA: Diagnosis not present

## 2023-11-30 DIAGNOSIS — W19XXXA Unspecified fall, initial encounter: Secondary | ICD-10-CM

## 2023-11-30 DIAGNOSIS — Y906 Blood alcohol level of 120-199 mg/100 ml: Secondary | ICD-10-CM | POA: Insufficient documentation

## 2023-11-30 DIAGNOSIS — Z7901 Long term (current) use of anticoagulants: Secondary | ICD-10-CM | POA: Diagnosis not present

## 2023-11-30 DIAGNOSIS — I10 Essential (primary) hypertension: Secondary | ICD-10-CM | POA: Diagnosis not present

## 2023-11-30 DIAGNOSIS — M25552 Pain in left hip: Secondary | ICD-10-CM | POA: Insufficient documentation

## 2023-11-30 DIAGNOSIS — M25551 Pain in right hip: Secondary | ICD-10-CM

## 2023-11-30 LAB — ETHANOL: Alcohol, Ethyl (B): 169 mg/dL — ABNORMAL HIGH (ref <10)

## 2023-11-30 NOTE — ED Triage Notes (Signed)
 Pt arrives via EMS from home after a fall where she was walking with her walker and she fell due to pain in her left hip. Now complaining of increased pain in her left hip 8/10. +blood thinners denies hitting her head, denies any other pain. +ETOH - some slurred speech - moving all extremities - A/ox4, answering questions appropriately. Breathing even and unlabored.

## 2023-11-30 NOTE — ED Notes (Signed)
 Pt ambulated to the bathroom with a walker - pt had steady gait with walker. Complaints of left hip pain.

## 2023-11-30 NOTE — Discharge Instructions (Signed)
 No evidence of fracture on your x-ray.  Please follow-up with your primary care provider for reassessment.  Please return for any worsening symptoms

## 2023-11-30 NOTE — ED Provider Notes (Signed)
 [facility] Provider Note    Event Date/Time   First MD Initiated Contact with Patient 11/30/23 1915     (approximate)   History   Fall   HPI Andrea Kennedy is a 78 y.o. female with history of HTN, HLD, GERD, A-fib on Eliquis presenting today for fall.  Patient states she has had some chronic pain in her left hip.  When she was up walking earlier she had a sharp pain in her hip which caused her leg to give out.  She is having pain in her left hip at this time where she landed.  Denies any head or neck injury.  Does admit to alcohol use today.  No loss of consciousness.  Denies pain symptoms elsewhere.     Physical Exam   Triage Vital Signs: ED Triage Vitals  Encounter Vitals Group     BP      Systolic BP Percentile      Diastolic BP Percentile      Pulse      Resp      Temp      Temp src      SpO2      Weight      Height      Head Circumference      Peak Flow      Pain Score      Pain Loc      Pain Education      Exclude from Growth Chart     Most recent vital signs: Vitals:   11/30/23 1925 11/30/23 2148  BP: 112/68 104/83  Pulse: 96 96  Resp: 16   Temp: 97.9 F (36.6 C)   SpO2: 98% 94%    I have reviewed the vital signs. General:  Awake, alert, no acute distress. Appears intoxicated Head:  Normocephalic, Atraumatic. EENT:  PERRL, EOMI, Oral mucosa pink and moist, Neck is supple. Cardiovascular: Regular rate, 2+ distal pulses. Respiratory:  Normal respiratory effort, symmetrical expansion, no distress.   Extremities:  Moving all four extremities through full ROM without pain.  No specific tenderness palpation to left hip but this is where she states her pain is at Neuro:  Alert and oriented.  Interacting appropriately.   Skin:  Warm, dry, no rash.   Psych: Appropriate affect.    ED Results / Procedures / Treatments   Labs (all labs ordered are listed, but only abnormal results are displayed) Labs Reviewed  ETHANOL -  Abnormal; Notable for the following components:      Result Value   Alcohol, Ethyl (B) 169 (*)    All other components within normal limits     EKG    RADIOLOGY Independently interpreted x-ray with no evidence of fracture   PROCEDURES:  Critical Care performed: No  Procedures   MEDICATIONS ORDERED IN ED: Medications - No data to display   IMPRESSION / MDM / ASSESSMENT AND PLAN / ED COURSE  I reviewed the triage vital signs and the nursing notes.                              Differential diagnosis includes, but is not limited to, alcohol intoxication, left hip hematoma, left hip fracture  Patient's presentation is most consistent with acute complicated illness / injury requiring diagnostic workup.  Patient is a 78 year old female presenting today for ground-level fall with left hip injury.  Denies head or neck injury.  Does appear  intoxicated on arrival.  No obvious signs of trauma but having some left hip pain and we will get x-rays to evaluate.  X-ray shows no evidence of fracture.  Patient able to ambulate.  Family will take her home at this time.  Will follow-up with PCP and given strict return precautions.  Clinical Course as of 11/30/23 2219  Thu Nov 30, 2023  2015 Alcohol, Ethyl (B)(!): 169 [DW]  2119 DG Hip Unilat W or Wo Pelvis 2-3 Views Left Independently interpreted with no acute fracture [DW]    Clinical Course User Index [DW] Kandee Orion, MD     FINAL CLINICAL IMPRESSION(S) / ED DIAGNOSES   Final diagnoses:  Fall, initial encounter  Right hip pain     Rx / DC Orders   ED Discharge Orders     None        Note:  This document was prepared using Dragon voice recognition software and may include unintentional dictation errors.   Kandee Orion, MD 11/30/23 2219
